# Patient Record
Sex: Female | Born: 1955 | Race: White | Hispanic: No | Marital: Married | Smoking: Never smoker
Health system: Southern US, Community
[De-identification: ages and names within clinical notes are randomized; demographics above are authoritative.]

## PROBLEM LIST (undated history)

## (undated) DIAGNOSIS — C801 Malignant (primary) neoplasm, unspecified: Secondary | ICD-10-CM

## (undated) DIAGNOSIS — R112 Nausea with vomiting, unspecified: Secondary | ICD-10-CM

## (undated) DIAGNOSIS — I10 Essential (primary) hypertension: Secondary | ICD-10-CM

## (undated) DIAGNOSIS — G5753 Tarsal tunnel syndrome, bilateral lower limbs: Secondary | ICD-10-CM

## (undated) DIAGNOSIS — Z9889 Other specified postprocedural states: Secondary | ICD-10-CM

## (undated) DIAGNOSIS — G4730 Sleep apnea, unspecified: Secondary | ICD-10-CM

## (undated) DIAGNOSIS — J45909 Unspecified asthma, uncomplicated: Secondary | ICD-10-CM

## (undated) DIAGNOSIS — R0602 Shortness of breath: Secondary | ICD-10-CM

## (undated) DIAGNOSIS — Z8719 Personal history of other diseases of the digestive system: Secondary | ICD-10-CM

## (undated) DIAGNOSIS — IMO0001 Reserved for inherently not codable concepts without codable children: Secondary | ICD-10-CM

## (undated) DIAGNOSIS — K219 Gastro-esophageal reflux disease without esophagitis: Secondary | ICD-10-CM

## (undated) DIAGNOSIS — M1990 Unspecified osteoarthritis, unspecified site: Secondary | ICD-10-CM

## (undated) DIAGNOSIS — M797 Fibromyalgia: Secondary | ICD-10-CM

## (undated) HISTORY — PX: BACK SURGERY: SHX140

## (undated) HISTORY — PX: TONSILLECTOMY: SUR1361

## (undated) HISTORY — PX: CERVICAL CONIZATION W/BX: SHX1330

## (undated) HISTORY — PX: APPENDECTOMY: SHX54

## (undated) HISTORY — DX: Tarsal tunnel syndrome, bilateral lower limbs: G57.53

---

## 2001-03-26 HISTORY — PX: CHOLECYSTECTOMY: SHX55

## 2002-03-26 HISTORY — PX: ABDOMINAL HYSTERECTOMY: SHX81

## 2003-04-02 ENCOUNTER — Encounter: Admission: RE | Admit: 2003-04-02 | Discharge: 2003-04-02 | Payer: Self-pay | Admitting: Allergy and Immunology

## 2004-03-26 HISTORY — PX: CERVICAL DISC SURGERY: SHX588

## 2005-06-21 ENCOUNTER — Inpatient Hospital Stay (HOSPITAL_COMMUNITY): Admission: RE | Admit: 2005-06-21 | Discharge: 2005-06-23 | Payer: Self-pay | Admitting: Specialist

## 2006-09-19 ENCOUNTER — Encounter: Admission: RE | Admit: 2006-09-19 | Discharge: 2006-09-19 | Payer: Self-pay | Admitting: Family Medicine

## 2006-09-24 ENCOUNTER — Inpatient Hospital Stay (HOSPITAL_COMMUNITY): Admission: RE | Admit: 2006-09-24 | Discharge: 2006-09-25 | Payer: Self-pay | Admitting: Specialist

## 2006-11-21 ENCOUNTER — Encounter: Admission: RE | Admit: 2006-11-21 | Discharge: 2007-01-08 | Payer: Self-pay | Admitting: Specialist

## 2007-02-19 ENCOUNTER — Encounter: Admission: RE | Admit: 2007-02-19 | Discharge: 2007-02-19 | Payer: Self-pay | Admitting: Specialist

## 2007-03-10 ENCOUNTER — Encounter: Admission: RE | Admit: 2007-03-10 | Discharge: 2007-03-10 | Payer: Self-pay | Admitting: Specialist

## 2009-01-05 ENCOUNTER — Ambulatory Visit (HOSPITAL_COMMUNITY): Admission: RE | Admit: 2009-01-05 | Discharge: 2009-01-05 | Payer: Self-pay | Admitting: Family Medicine

## 2009-03-01 ENCOUNTER — Ambulatory Visit (HOSPITAL_COMMUNITY): Admission: RE | Admit: 2009-03-01 | Discharge: 2009-03-01 | Payer: Self-pay | Admitting: Specialist

## 2009-07-01 ENCOUNTER — Ambulatory Visit (HOSPITAL_COMMUNITY): Admission: RE | Admit: 2009-07-01 | Discharge: 2009-07-02 | Payer: Self-pay | Admitting: Specialist

## 2010-06-08 ENCOUNTER — Other Ambulatory Visit: Payer: Self-pay | Admitting: Family Medicine

## 2010-06-08 DIAGNOSIS — M549 Dorsalgia, unspecified: Secondary | ICD-10-CM

## 2010-06-09 ENCOUNTER — Other Ambulatory Visit: Payer: Self-pay

## 2010-06-12 ENCOUNTER — Ambulatory Visit
Admission: RE | Admit: 2010-06-12 | Discharge: 2010-06-12 | Disposition: A | Discharge status: Home / Self Care | Payer: No Typology Code available for payment source | Source: Ambulatory Visit | Attending: Family Medicine | Admitting: Family Medicine

## 2010-06-12 DIAGNOSIS — M549 Dorsalgia, unspecified: Secondary | ICD-10-CM

## 2010-06-12 MED ORDER — GADOBENATE DIMEGLUMINE 529 MG/ML IV SOLN
15.0000 mL | Freq: Once | INTRAVENOUS | Status: AC | PRN
  Administered 2010-06-12: 15 mL via INTRAVENOUS

## 2010-06-14 LAB — DIFFERENTIAL
Basophils Absolute: 0.1 10*3/uL (ref 0.0–0.1)
Basophils Relative: 1 % (ref 0–1)
Eosinophils Absolute: 0.1 10*3/uL (ref 0.0–0.7)
Eosinophils Relative: 2 % (ref 0–5)
Lymphocytes Relative: 39 % (ref 12–46)
Lymphs Abs: 2.9 10*3/uL (ref 0.7–4.0)
Monocytes Absolute: 0.8 10*3/uL (ref 0.1–1.0)
Monocytes Relative: 10 % (ref 3–12)
Neutro Abs: 3.5 10*3/uL (ref 1.7–7.7)
Neutrophils Relative %: 47 % (ref 43–77)

## 2010-06-14 LAB — COMPREHENSIVE METABOLIC PANEL
ALT: 14 U/L (ref 0–35)
AST: 15 U/L (ref 0–37)
Albumin: 4.4 g/dL (ref 3.5–5.2)
Alkaline Phosphatase: 64 U/L (ref 39–117)
BUN: 15 mg/dL (ref 6–23)
CO2: 28 mEq/L (ref 19–32)
Calcium: 9.6 mg/dL (ref 8.4–10.5)
Chloride: 102 mEq/L (ref 96–112)
Creatinine, Ser: 0.96 mg/dL (ref 0.4–1.2)
GFR calc Af Amer: >60 mL/min (ref >60)
GFR calc non Af Amer: >60 mL/min (ref >60)
Glucose, Bld: 104 mg/dL — ABNORMAL HIGH (ref 70–99)
Potassium: 4 mEq/L (ref 3.5–5.1)
Sodium: 136 mEq/L (ref 135–145)
Total Bilirubin: 0.6 mg/dL (ref 0.3–1.2)
Total Protein: 6.9 g/dL (ref 6.0–8.3)

## 2010-06-14 LAB — URINALYSIS, ROUTINE W REFLEX MICROSCOPIC
Bilirubin Urine: NEGATIVE
Glucose, UA: NEGATIVE mg/dL
Hgb urine dipstick: NEGATIVE
Ketones, ur: NEGATIVE mg/dL
Nitrite: NEGATIVE
Protein, ur: NEGATIVE mg/dL
Specific Gravity, Urine: 1.011 (ref 1.005–1.030)
Urobilinogen, UA: 0.2 mg/dL (ref 0.0–1.0)
pH: 5.5 (ref 5.0–8.0)

## 2010-06-14 LAB — CBC
HCT: 40.5 % (ref 36.0–46.0)
Hemoglobin: 14.1 g/dL (ref 12.0–15.0)
MCHC: 34.9 g/dL (ref 30.0–36.0)
MCV: 88.5 fL (ref 78.0–100.0)
Platelets: 250 10*3/uL (ref 150–400)
RBC: 4.57 MIL/uL (ref 3.87–5.11)
RDW: 11.9 % (ref 11.5–15.5)
WBC: 7.5 10*3/uL (ref 4.0–10.5)

## 2010-06-14 LAB — PROTIME-INR
INR: 0.96 (ref 0.00–1.49)
Prothrombin Time: 12.7 seconds (ref 11.6–15.2)

## 2010-07-14 ENCOUNTER — Encounter (HOSPITAL_COMMUNITY)
Admission: RE | Admit: 2010-07-14 | Discharge: 2010-07-14 | Disposition: A | Discharge status: Home / Self Care | Payer: Medicare Other | Source: Ambulatory Visit | Attending: Specialist | Admitting: Specialist

## 2010-07-14 ENCOUNTER — Other Ambulatory Visit (HOSPITAL_COMMUNITY): Payer: Self-pay | Admitting: Specialist

## 2010-07-14 ENCOUNTER — Ambulatory Visit (HOSPITAL_COMMUNITY)
Admission: RE | Admit: 2010-07-14 | Discharge: 2010-07-14 | Disposition: A | Discharge status: Home / Self Care | Payer: Medicare Other | Source: Ambulatory Visit | Attending: Specialist | Admitting: Specialist

## 2010-07-14 DIAGNOSIS — Z01818 Encounter for other preprocedural examination: Secondary | ICD-10-CM | POA: Insufficient documentation

## 2010-07-14 DIAGNOSIS — Z01812 Encounter for preprocedural laboratory examination: Secondary | ICD-10-CM | POA: Insufficient documentation

## 2010-07-14 DIAGNOSIS — Z01810 Encounter for preprocedural cardiovascular examination: Secondary | ICD-10-CM | POA: Insufficient documentation

## 2010-07-14 DIAGNOSIS — M48061 Spinal stenosis, lumbar region without neurogenic claudication: Secondary | ICD-10-CM | POA: Insufficient documentation

## 2010-07-14 DIAGNOSIS — J45909 Unspecified asthma, uncomplicated: Secondary | ICD-10-CM | POA: Insufficient documentation

## 2010-07-14 LAB — CBC
HCT: 41.5 % (ref 36.0–46.0)
Hemoglobin: 14.6 g/dL (ref 12.0–15.0)
MCH: 30.6 pg (ref 26.0–34.0)
MCHC: 35.2 g/dL (ref 30.0–36.0)
MCV: 87 fL (ref 78.0–100.0)
Platelets: 293 10*3/uL (ref 150–400)
RBC: 4.77 MIL/uL (ref 3.87–5.11)
RDW: 12 % (ref 11.5–15.5)
WBC: 8.9 10*3/uL (ref 4.0–10.5)

## 2010-07-14 LAB — PROTIME-INR
INR: 0.85 (ref 0.00–1.49)
Prothrombin Time: 11.8 seconds (ref 11.6–15.2)

## 2010-07-14 LAB — URINALYSIS, ROUTINE W REFLEX MICROSCOPIC
Bilirubin Urine: NEGATIVE
Glucose, UA: NEGATIVE mg/dL
Hgb urine dipstick: NEGATIVE
Ketones, ur: NEGATIVE mg/dL
Nitrite: NEGATIVE
Protein, ur: NEGATIVE mg/dL
Specific Gravity, Urine: 1.008 (ref 1.005–1.030)
Urobilinogen, UA: 0.2 mg/dL (ref 0.0–1.0)
pH: 6 (ref 5.0–8.0)

## 2010-07-14 LAB — DIFFERENTIAL
Basophils Absolute: 0.1 10*3/uL (ref 0.0–0.1)
Basophils Relative: 1 % (ref 0–1)
Eosinophils Absolute: 0.2 10*3/uL (ref 0.0–0.7)
Eosinophils Relative: 2 % (ref 0–5)
Lymphocytes Relative: 30 % (ref 12–46)
Lymphs Abs: 2.6 10*3/uL (ref 0.7–4.0)
Monocytes Absolute: 0.8 10*3/uL (ref 0.1–1.0)
Monocytes Relative: 9 % (ref 3–12)
Neutro Abs: 5.3 10*3/uL (ref 1.7–7.7)
Neutrophils Relative %: 59 % (ref 43–77)

## 2010-07-14 LAB — SURGICAL PCR SCREEN
MRSA, PCR: NEGATIVE
Staphylococcus aureus: NEGATIVE

## 2010-07-14 LAB — COMPREHENSIVE METABOLIC PANEL
Albumin: 4.4 g/dL (ref 3.5–5.2)
BUN: 10 mg/dL (ref 6–23)
Calcium: 9.6 mg/dL (ref 8.4–10.5)
Creatinine, Ser: 0.98 mg/dL (ref 0.4–1.2)
Glucose, Bld: 105 mg/dL — ABNORMAL HIGH (ref 70–99)
Total Protein: 7.3 g/dL (ref 6.0–8.3)

## 2010-07-14 LAB — TYPE AND SCREEN
ABO/RH(D): B POS
Antibody Screen: NEGATIVE

## 2010-07-17 ENCOUNTER — Inpatient Hospital Stay (HOSPITAL_COMMUNITY): Payer: Medicare Other

## 2010-07-17 ENCOUNTER — Inpatient Hospital Stay (HOSPITAL_COMMUNITY)
Admission: RE | Admit: 2010-07-17 | Discharge: 2010-07-20 | DRG: 460 | Disposition: A | Discharge status: Home / Self Care | Payer: Medicare Other | Source: Ambulatory Visit | Attending: Specialist | Admitting: Specialist

## 2010-07-17 DIAGNOSIS — J45909 Unspecified asthma, uncomplicated: Secondary | ICD-10-CM | POA: Diagnosis present

## 2010-07-17 DIAGNOSIS — K219 Gastro-esophageal reflux disease without esophagitis: Secondary | ICD-10-CM | POA: Diagnosis present

## 2010-07-17 DIAGNOSIS — M5137 Other intervertebral disc degeneration, lumbosacral region: Secondary | ICD-10-CM | POA: Diagnosis present

## 2010-07-17 DIAGNOSIS — M48061 Spinal stenosis, lumbar region without neurogenic claudication: Principal | ICD-10-CM | POA: Diagnosis present

## 2010-07-17 DIAGNOSIS — M51379 Other intervertebral disc degeneration, lumbosacral region without mention of lumbar back pain or lower extremity pain: Secondary | ICD-10-CM | POA: Diagnosis present

## 2010-07-17 DIAGNOSIS — D62 Acute posthemorrhagic anemia: Secondary | ICD-10-CM | POA: Diagnosis not present

## 2010-07-17 DIAGNOSIS — J309 Allergic rhinitis, unspecified: Secondary | ICD-10-CM | POA: Diagnosis present

## 2010-07-18 LAB — BASIC METABOLIC PANEL
BUN: 3 mg/dL — ABNORMAL LOW (ref 6–23)
CO2: 26 mEq/L (ref 19–32)
Calcium: 7.5 mg/dL — ABNORMAL LOW (ref 8.4–10.5)
Glucose, Bld: 154 mg/dL — ABNORMAL HIGH (ref 70–99)
Sodium: 138 mEq/L (ref 135–145)

## 2010-07-18 LAB — HEMOGLOBIN AND HEMATOCRIT, BLOOD: Hemoglobin: 9.4 g/dL — ABNORMAL LOW (ref 12.0–15.0)

## 2010-07-19 ENCOUNTER — Inpatient Hospital Stay (HOSPITAL_COMMUNITY): Payer: Medicare Other

## 2010-07-20 LAB — HEMOGLOBIN AND HEMATOCRIT, BLOOD: Hemoglobin: 9 g/dL — ABNORMAL LOW (ref 12.0–15.0)

## 2010-08-08 NOTE — Op Note (Signed)
[name], [name]              ACCOUNT NO.:  [id number]   MEDICAL RECORD NO.:  [id number]          PATIENT TYPE:  INP   LOCATION:  2550                         FACILITY:  [facility]   PHYSICIAN:  Kerrin Champagne, M.D.   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   PROCEDURE:  Right L5-S1 microdiskectomy, left L5 foraminotomy with left  sided hemilaminectomy and left lateral recess decompression L4-5 and L5-  S1.   SURGEON:  Kerrin Champagne, M.D.   ASSISTANT:  Wende Neighbors, P.A.   ANESTHESIA:  General via oral tracheal intubation, Dr. Noreene Larsson.   SPECIMENS:  None.   ESTIMATED BLOOD LOSS:  50 mL or less.   COMPLICATIONS:  None.   DRAINS:  Foley to straight drain.   The patient returned to the PACU in good condition.   HISTORY OF PRESENT ILLNESS:  The patient is a 55 year old female who has  undergone previous anterior cervical diskectomy and fusion for disk  herniation in 2007.  She has been experiencing discomfort and pain for  the last 3 months, worsening in the right leg greater than left with  right sided leg numbness.  She has had previous lumbar spine surgery 12  years ago at [facility]  She is seen in the office complaining of  increasing neurogenic claudication symptoms, pain and radiation into  both lower extremities on right side S1 distribution and left side L5  distribution.  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  Lateral recess stenosis on left side L4-5 and L5-  S1.  The patient's clinical exam is consistent with a left L5  radiculopathy, right S1  radiculopathy.   INTRAOPERATIVE FINDINGS:  The patient was found to have scar along the  left side at the L5-S1 level.  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  She has severe lateral recess entrapment at the left S1  nerve root and the right S1 nerve root within the lateral recess  secondary to degenerative disk bulging over the posterior aspect of the  disk space, comminution with facet hypertrophy affecting the lateral  recess in the left neural foramen from the L5 nerve root.  The reflected  portions of ligamentum flavum, the very superior aspect of the left S1  superior articular process was resected in order to decompress the left  L5 nerve root.  Recess at L4-5 was also narrowed causing 5 nerve root  compression.  Disk protrusion right side L5-S1.   DESCRIPTION OF PROCEDURE:  After adequate general anesthesia with the  patient in a knee-chest position, Andrews frame, standard preoperative  antibiotics, standard preoperative antibiotics, standard prep  with  DuraPrep solution and all pressure points well padded, a Foley catheter  placed prior to turning.  The patient underwent a prep with DuraPrep  solution.  Midline extended from the mid dorsal segment to the mid  sacral level, draped in the usual manner and an iodine body drape was  used.  The patient had a spinal needle placed over the posterior aspect  of the lumbosacral region at the expected lower lumbar upper sacral  segment, the needle was found to be between the S1 and S2 spinous  processes on lateral radiograph.  An incision was made ellipsing an old  incision scar in the midline.  This was carried superiorly an additional  1-1/2 inches.  Through the skin and subcutaneous layers down to the  lumbodorsal fascia, this was incised on the left side of the expected  spinous process at L4, L5 and S1 and on the right side at the L5 and S1   levels.  A Kocher clamp placed over the spinous process of L5 and  intraoperative lateral radiograph demonstrated the clamp at the L5  spinous process.  This was marked for continued identification  throughout the case.  Cobbs used to elevate paralumbar muscles both  sides over the L5 lamina over the posterior aspect of the interlaminar  region, care taken not to enter the interlaminar region left or right  side and then carried up to the L4-5 level on the left side.  McCullough  retractor boss type with a large blade on the left side and smaller  blade on the right side was then placed.  Using loop magnification and a  head lamp, then a small amount of the inferior aspect of the lamina of  L5 and at L4 was resected on the left side.  A 3-mm Kerrison was used to  then remove further bone along the left inferior lamina of L5 extending  superior to the L4-5 level and completing a hemilaminectomy on the left  side at the left L5 lamina.  A small amount of the superior lamina of S1  was resected on the left side.  Foraminotomy performed over the left S1  nerve root.  The lateral recess then decompressed on the left side,  continuing along the medial aspect of the S1 pedicle and then removing  superior articular process and inferior articular process bone medially,  completing the decompression, and then from superiorly, the lateral  recess of L4 was resected and decompressed using a 3-mm Kerrison loop  magnification and a head light, removing hypertrophic ligamentum flavum  reflected portions medially decompressing the L5 nerve root at the L4-5  level.  This was carried out the L5 neuroforamen using a high speed bur  to carefully thin the area over the medial aspect of the pars,  preserving at least 8 mm of pars bone from medial to lateral.  This  allowed [name] to expose the L5 nerve root within the neuroforamen and resect bone over the superiormost aspect of the S1 superior articular  process on  the left side, removing hypertrophic ligamentum flavum within  the neuroforamen, decompressing the L5 nerve root.  On the right side, a  small amount of bone was removed from the inferior lamina of L5 on the  right side.  A 3-mm Kerrison used to carefully free up the insertion at  the ligamentum flavum to the ventral surface of the L5 inferior lamina.  Then, placing the Kerrisons over the superior aspect of the S1  lamina,  the ligamentum flavum was further resected and freed up and able to be  removed off the medial facet at L5-S1.  Foraminotomy performed over the  S1 nerve root.  Lateral recess decompressed on the right side using 3-mm  Kerrisons until the hypertrophic ligamentum flavum had been resected and  the S1 pedicle as identified.  The S1 nerve root identified.   The operating room microscope was then carefully draped, brought into  the field.  Under the operating room microscope, then a right S1 nerve  root was mobilized freeing up epidural veins, cauterizing where  appropriate using bipolar electrocautery, then moving across and  retracting the S1 nerve root and lateral aspect of thecal sac on the  right side.  Disk was identified at L5-S1, found to be herniated.  The  15 blade scalpel used to incise the disk longitudinally and then a nerve  hook used to free up loose disk material in a subligamentous fashion.  Epstein curet used also to debride and loosen more fibrotic and  calcified herniated disk material posteriorly.  This was then resected  using pituitary rongeurs.  The disk space itself could not be entered  due to the severe nature of the narrowing of the disk space, however,  the disk and subligamentous herniated material was removed.  S1 nerve  root was then freely able to exit out its neuroforamen without any nerve  compression.  Hockey stick neural probe could be passed out the L5  neuroforamen on the right side without difficulty.  Attention then  turned, using the  microscope to the left side at L5 nerve root where  this nerve was found to be compressed within the neuroforamen.  A high  speed bur used to remove additional bone over the superior aspect of the  left L5-S1 facet in the pars area, again preserving up to 8 mm of pars.  Then using Kerrisons to undercut the lamina area and resect the  superiormost reflected portions of ligamentum flavum that were pressing  upon the L5 nerve root within the neuroforamen.  This was carried out  without difficulty.  The L5 nerve root noted to be exiting without  difficulty.  A hockey stick neural probe could be easily passed out the  left L5 neuroforamen above and below the nerve root.  Carefully  examining the L4-5 lateral recess, the L5 nerve root appeared to be  traveling within the lateral recess without any further nerve  compression.  This completed the procedure for decompression in both the left L4-5 and L5-S1 level and left L5 neuroforamen as well as right L5-  S1 microdiskectomy.  Irrigation was performed, careful hemostasis, bone  wax applied to the bleeding cancellous bone surfaces, excess bone wax  removed.  Thrombin soaked Gelfoam first placed and then removed and no  Gelfoam was left within the lateral recesses.  There was no active  bleeding present.  Soft tissues were allowed to fall back into place.  Lumbodorsal fascia reapproximated with interrupted #1 Vicryl sutures.  Deep subcutaneous layers approximated with interrupted 0 Vicryl sutures,  more superficial layers with interrupted 2-0 Vicryl sutures and the skin  closed with a running subcutaneous stitch of 4-0 Vicryl.  Dermabond was  then applied, then 4 x 4's fixed to the skin with Hypafix tape.  Note  that the skin and subcutaneous layers were reinfiltrated with Marcaine  0.5% 1:200,000 epinephrine at the end of the case.  The patient was then  reactivated, extubated, and  returned to the recovery room in  satisfactory condition.  All  instrument and sponge counts were correct.      Kerrin Champagne, M.D.  Electronically Signed     JEN/MEDQ  D:  09/24/2006  T:  09/24/2006  Job:  [id number]

## 2010-08-09 NOTE — Op Note (Signed)
NAMEMAKENZI, BANNISTER              ACCOUNT NO.:  [id number]  MEDICAL RECORD NO.:  [id number]           PATIENT TYPE:  I  LOCATION:  5025                         FACILITY:  [facility]  PHYSICIAN:  Kerrin Champagne, M.D.   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  A combination of hypertrophic ligamentum flavum and spur off the medial aspect of the left L2-3 facet. Left L3-4 lateral recess stenosis with stenosis of the foramen secondary to hypertrophic facet changes as well as underlying disk bulge into the lateral aspect of the foramen.  L4-5 bilateral lateral recess stenosis secondary to hypertrophic changes of facets and disk bulge.  Left disk injury with disk tear into the neural foramen.  L5-S1 degenerative disk disease, severe left L5 foraminal entrapment status post previous decompression with recurring stenosis out lateral.  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  A combination of hypertrophic ligamentum flavum and spur off the medial aspect of the left L2-3 facet. Left L3-4 lateral recess stenosis with stenosis of the foramen secondary to hypertrophic facet changes as well as underlying disk bulge into the lateral aspect of the foramen.  L4-5 bilateral lateral recess stenosis secondary to hypertrophic changes of facets and disk bulge.  Left disk injury with disk tear into the neural foramen.  L5-S1 degenerative disk disease, severe left L5 foraminal entrapment status post previous decompression with recurring stenosis out lateral.  Also, during the surgery she had a right S1 nerve root bleb associated with minor to moderate tear, requiring single suture of 4-0 Nurolon repair.  DuraSeal was used.  PROCEDURE:  Left L5-S1  transforaminal lumbar interbody fusion with an 11- mm DePuy lordotic Concorde cage and local bone graft.  Central laminectomy at L4-5, L5-S1 and L3-4.  With left-sided L5-S1 total facetectomy for decompression of the L5 nerve root.  Foraminotomy left L4-5 with central lam at this level, left L2-3 lateral recess decompression, posterior lateral fusion then carried out at the L4-5 and L5-S1 levels of the posterior and lateral fusion with local bone graft. Posterior segmental instrumentation using pedicle screws and rods from L4-S1, a total of 3 vertebral segments.  A 7-mm screws were used at each levels.  Dural repair right S1 dorsally at nerve root shoulder using a single 4-0 Nurolon suture.  SURGEON:  Kerrin Champagne, MD  ASSISTANT:  Maud Deed.  ANESTHESIA:  General via orotracheal intubation, Dr. Achille Rich. Anesthesia record.  Supplemented with local infiltration with Marcaine 0.5% with 1:200,000 epinephrine 20 mL midline.  ESTIMATED BLOOD LOSS:  750 mL.  CELL SAVER BLOOD RETURN:  250 mL.  DRAINS:  Foley to straight drain, Hemovac x1, left lower lumbar.  COMPLICATIONS:  Left S1 nerve root dural bleb 1 mm x 2 mm in length.  CLINICAL HISTORY:  This patient is a 55 year old female.  She has had a history of previous laminectomy surgery bilateral at L5-S1 and left side L4-5  for problems of primary foraminal stenosis and lateral recess stenosis.  She has severe degenerative disk disease L5-S1.  She unfortunately has persisted with pain discomfort for years, requiring use of medications, anti-inflammatory agents, intermittent narcotics and muscle relaxers.  Over the last year, her pain has become increasingly worsened with difficulty standing or sitting for any length of time. 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  She has undergone attempts at conservative management that were unsuccessful.  Therapy was with  no benefit.  Further workup has demonstrated severe recurrent foraminal stenosis affecting the left L5 nerve root, bilateral lateral recess stenosis L4-5 with disk injury at L4-5 level, lateral recess stenosis left side L3-4 and at the L2-3 levels.  After discussion, the patient has decided to undergo intervention attempts to relieve her discomfort to improve her standing, walking tolerance primarily.  The risks of surgery including risks of infection, bleeding, neurologic compromise discussed with this patient and planned surgery as well.  Aforementioned procedure discussed.  She has signed informed consent.  DESCRIPTION OF PROCEDURE:  This patient was seen in the preoperative holding area, had marking of the left side back at the left L4-5, L5-S1 level, her left L4-5 TLIF and left L5-S1 facetectomy, central laminectomies planned at L3-L4, L4-L5, L5-S1, extension then to the L2-3 level for lateral recess decompression on the left side.  All questions were answered in the preop holding area.  She has signed informed consent.  Standard preoperative antibiotics were delivered.  The patient transferred via stretcher to OR room #4.  There, she underwent induction of general anesthesia, Foley catheter was placed.  She was then turned to a prone position and Jackson spine frame was used for the case.  All pressure points were well-padded, arms at side 90-90, elbows padded. PAS hose for both lower extremities to prevent DVT.  The patient had a standard prep with DuraPrep solution from mid-dorsal segment to the mid sacral level.  Draped in the usual manner, iodine Vi-Drape was used. Standard time-out protocol carried out identifying the patient, procedures to be done, estimated length of time, blood loss.  Midline previous incision was infiltrated with Marcaine 0.5% with 1:200,000 epinephrine and an ellipse incision eclipsing the old incision scar and this was then carried down to the spinous  processes that were present L2- L3, L4-L5, S1-S2.  Incision was carried sharply with the Bovie electrocautery along the lateral aspects of spinous process of L2-L3, L4- L5, S1-S2 both sides.  Cobbs were used to elevate the paralumbar muscles bilaterally and cerebellar retractors were inserted.  Facets preserved at the L2-3, L3-4 levels.  Resected it out the expected L4-L5 levels after first identifying these levels.  Clamps were placed on either side at expected spinous process of L5.  Intraoperative lateral radiograph demonstrated the upper clamp at the L4-5 level, lower clamp just on the spinous process of L5.  L5 spinous process was then marked using Baer rongeur and also marked at the interval between L4 and L5 spinous process.  C-arm fluoro was draped sterilely and brought on the field for this purpose.  With this then Leksell rongeur was used to remove spinous process of L5, small portion of the upper aspect of the spinous process of S1 and then the entire spinous process of L4 and L3, inferior third of the spinous process of L2.  Central portions of lamina of L3, L4 and L5 were then carefully thinned using Leksell rongeurs centrally. Osteotomes were then  used to resect inferior reticular process of L5 on the left side as well as the inferior articular process of L4 on the left side for expected TLIF of the L4 level and for performing foraminotomy in left L5 nerve root.  The central portions of lamina at L5, L4 and L3 were then carefully resected using osteotomes were best possible to remove bone and large portions and then Kerrisons 3 and 4 mm Kerrisons performing a central laminectomy from L5-L4-L3.  This was then lightened using osteotomes bilaterally, resecting the inferior pars on the left at the L5 level, the inferior reticular process of L5.  The medial aspect of the L4 inferior articular process was resected over 50% to allow for decompression of the left L4-5 lateral recess.   At the 3-4 level, bilateral lateral recess decompression was carried out using first osteotome resect about 10-15% of the medial aspect of facet at 3-4 and then on the left side at L2-3.  Loupe magnification headlamp was used during this portion of procedure as well to regrowth and protect thecal sac, cottonoids were appropriate.  Hypertrophic ligamentum flavum was resected off the medial aspect of the facet at 2-3, 3-4 level bilaterally decompressing the L2-L3 nerve roots bilaterally. Foraminotomy performed on left L3-4 level.  First starting on the left side, right side was decompressed with resection of hypertrophic flavum and decompression of the neural foramen for the 3 root, 4 root, 5 root on the right side and then S1.  The superior portion of the lamina S was carefully resected centrally and then to the right hand to the left. Foraminotomy performed over both S1 nerve roots.  An osteotome was then used to resect the medial aspect of the right L5-S1 facet and nearly 50% was resected in order to get out to lateral recess decompress S1 nerve root, while resecting the bone after osteotomizing, a bleb was noted over the shoulder of the S1 nerve root.  It was carefully covered with cottonoid.  No leak noted.  Continued dissection on the right side for decompression of the L5-L4 and L3 nerve roots by resecting reflected portions of flavum of the medial and ventral surfaces of the facet joints at each level.  Then changing sides to the right-hand side of this patient, the left-hand side was then decompressed further from 2-3 down to 3-4 foraminotomy performed of the left L3 nerve root decompressing the 3 root patella was carefully traced laterally exiting, placed a Hockey-stick both over and underneath the nerve root demonstrating patency of the foramen out far lateral out beyond the facet.  Lateral recess at 3-4 decompressed and at 4-5 osteotomy carried out of the inferior reticular  process of L4 and the pars region resected, so that the reflected portion of ligamentum flavum both medial as well as ventral was completely resected on the 4 nerve roots traced laterally.  The superior reticular process of L5 was resected even with the upper aspect of the pedicle 5 at this level, so the nerve root was easily identified coursing over the inferior aspect of the pedicle afford exiting out the neural foramen.  The fibrovascular tissue within the axillary area of the 4 root identified, cauterized and the disk easily visualized.  Continuation of disk dissection then distally, facet overlying the L5 nerve root was resected using Leksell rongeurs to thin osteotomes were necessary.  All this bone graft was carefully morselized, debrided of any soft tissue attachment for later bone grafting purposes.  Five root was completely decompressed  out laterally and following resection of the facet, the patient still noted to have a layer of hypertrophic flavum pressing from dorsal to ventral and pressing upon the 5 root as it exited and this was carefully lifted off the nerve with a hockey-stick neural probe and then resected with 3- mm Kerrisons.  The patient's S1 nerve root and left neural foramen was then carefully foraminotomized using a 3-mm Kerrison, medial border of the S1 pedicle identified.  Hockey-stick neural probe could then be passed out bilateral at S1-L5, L4-L3 neural foramen without difficulty. This completed decompression portion of procedure.  C-arm fluoro brought into the field and all placed against the right-sided L4 pedicle at the intersection of the lateral aspect of the pedicle with transverse process, identified on C-arm to be in excellent position alignment. Handheld pedicle probe was then used to probe the pedicle L4 in the right side to the depth of about 35 mm, observed on C-arm fluoro to be at halfway or greater on the right side, so that a 35-mm length  screw was chosen x7 mm.  The patient's pedicle was felt to be quite wide. Tapping performed using a 6.0 mm tap decortication of the transverse process and lateral aspect of superior articular process before bone grafting carried out.  Ball-tip probe used to carefully probe the nerve opening into the pedicle to ensure no broaching cortex.  The 35 mm  x 7 mm screw was then placed without difficulty.  Decortication was carried out over the transverse process of 5 as well.  All brought to the 5 levels so that on checking the screw placement at the 4 level, the awl placement of 5 was noted to be in good position alignment, so the awl was removed and a handheld pedicle probe was then used probe pedicle 5 correct degree of convergence lordosis to a depth of nearly 40 mm.  This was the accepted length, 7 mm, was chosen here as well.  Tapping was 6.0 tap.  Ball-tip probe was then used carefully probe the opening of the pedicle ensure no broaching cortex and a 40 mm x 7 mm screw placed on the right side at the L5 level.  Bone graft was applied to the intertransverse area on the 4-5 level on the right and to the posterior aspect of the transverse process of 5.  The awl was then introduced at the lower half of the superior reticular process of S1 the area of dimple here.  An intraoperative radiograph demonstrated this to be in the correct position alignment for placement of sacral pedicle screw on the right side.  Facet at S1 level.  A hockey-stick neural probe used to probe the inferior extent of the pedicle as well as superior extent medial noted.  Using a pedicle probe then the S1 pedicle was probed to the depth of 35 mm.  A 35 mm x 7 mm screw was chosen.  Tapping a 6.0 tap just the superficial cortex.  Decortication of the sacral ala carried out bone graft with local bone graft then from the sacral ala to the right L5 transverse process.  This 35-mm screw then placed after first checking the  opening of the pedicle using a pedicle probe ball-tipped and this ensured no broaching cortex, screw was then placed without difficulty, observed on the C-arm fluoro to be in good position alignment.  Continuing on to the left side and after first placing a 55- mm rod into the fasteners from L4-S1 and placing the  initial caps. Change the size in the left side and then the patient has similarly loose screws placed at L4-L5 and S1, again decortication of the transverse process at each level.  An awl used under C-arm fluoro to ascertain the correct position alignment lordosis and convergence and a handheld pedicle probe was used to probe each of the pedicles probing with a ball-tipped probe each to ensure patency and no sign of broaching cortex.  Tapping was 6 mm tap and then placing screws appropriately on the left side at the L4 level again 40 mm x 7 mm screw was used at the 5 level, again a 7 mm x 40 mm screw and on the left side at the sacral level 40 mm screw was used here.  This provided excellent fixation at each level.  Excellent screw bone purchase.  Note, the bone graft was applied along the posterior and lateral region extending along the intertransverse area from L4 to L5-S1 and decorticated areas.  Attention then turned to the TLIF on the left side at the 4-5 level where a D'Errico retractor was then used to retract the thecal sac inferior to the L4 nerve root.  An opening made into the disk using a 15-blade scalpel at level of the pedicle, disk removed using pituitary rongeurs. Dilation of the disk space 8, 9, 10 and 11-mm dilator placed.  The space was then able to be debrided with straight upbiting pituitaries of larger variety.  Curettage carried out of the endplates using the straight upbiting right, upbiting left curettes as well as straight ring and upbiting ring curettes until we were down to bleeding endplate surfaces.  Care was taken not to damage the endplates.   Following this, then trial was installed into the disk space 11 mm trial found to provide best fit.  The patient had packing of the intervertebral disk space local bone graft.  Bone grafts placed into the intervertebral disk space and then packed into place using 8 mm sounder.  Following this, then the cage was installed, impacted into place and convergence about 15-20 degrees further impacted and rotated slightly.  Subset beneath the posterior aspect of the space by 3 mm.  Care taken to ensure there was no bone graft within this spinal canal.  Hockey-stick neural probe passed out the neural foramen along the left side S1-L5, L4-L3.  The bleb noted over the S1 nerve root on the right side was carefully repair with a single 4-0 Nurolon suture using loupe magnification and a figure- of-eight suture passed and carefully knotted without difficulty.  At this point, then the fasteners, the caps were then tightened on the right side to 80 foot pounds in the L4 level.  Left side length, the length of rod was measured at 65 mm and a precontoured rod was placed here.  The fastener on the left side at L4 tightened at 80 foot-pounds. Compression between the left L4 and L5 fasteners and the fastener cap at the 5 level was tightened to 80 foot-pounds.  The fastener at the S1 level on the left side was then tightened to 80 foot-pounds maintaining neutral compression distraction to the 5-1 level to prevent any further compression of the 5 root and to prevent distraction or compression here.  The right side then had compression between the 4 and 5 fasteners and the fastener at the 5 levels tightened to 80 foot pounds and then the fastener at the cap at the S1 levels was tightened at 80-foot pounds.  A cross-linking rod was then placed between the L4 and L5 fasteners to the rod tightened to 80 foot-pounds at each of the hooks on the sides and the central nut which adjusted length was tightened and an A6  cross link was used by [employer].  Following this, then irrigation was carried out.  Careful inspection of nerve root scan demonstrating well- decompressed throughout.  The area of bleb over the right side was then coated with DuraSeal.  Once this was solidified, then medium Hemovac drain was placed in the incision exiting on the left lower lumbar spine. Paralumbar muscles were reapproximated in midline with interrupted #1 Vicryl sutures.  Her lumbodorsal fascia was reattached to the spinous process, interspinous ligaments with interrupted #1 Vicryl sutures superiorly as well as inferiorly.  Then, the midline attached itself with interrupted figure-of-eight simple sutures #1 Vicryl.  Deep subcu layers was approximated with interrupted 0 Vicryl sutures and more superficial layers with interrupted 2-0 Vicryl suture.  Skin closed in a running subcu stitch of 4-0 Vicryl.  Note, at the end of the case, all instrument, sponge counts were correct.  A single cottonoid found under my foot at the end of the case which represented the final cottonoid that represented clean count at the end of case.  Following the closure of the incision, then with running subcu stitch of 4-0 Vicryl, Dermabond was applied and Mepilex bandage, then taped in place with pink tape. The patient was then returned to a supine position, reactivated, extubated, returned to recovery room in satisfactory condition.  All instrument, sponge counts were correct.  The patient had Cell Saver blood returned to 250 mL.  Physician assistant's responsibilities, Maud Deed, performed duties of Designer, television/film set.  She performed careful retraction of neural structures, careful suctioning using a Cell Saver throughout the case using loupe magnification.  She assisted in careful delicate retraction during insertion of cages in TLIF portion of procedure and also assisted in the tightening of the rods and screws, the instrumentation from L4 to  sacrum.  She was present from beginning of case to the end of the case, present for the initial positioning of the patient and at the end of the case for removal from the operating table.  Note, complication, bleb right S1 nerve root at shoulder, single suture repair.     Kerrin Champagne, M.D.     JEN/MEDQ  D:  07/18/2010  T:  07/19/2010  Job:  [id number]  Electronically Signed by Vira Browns M.D. on 08/09/2010 06:02:00 PM

## 2010-08-11 NOTE — Op Note (Signed)
[name], [name]              ACCOUNT NO.:  [id number]   MEDICAL RECORD NO.:  [id number]          PATIENT TYPE:  INP   LOCATION:  5029                         FACILITY:  [facility]   PHYSICIAN:  Kerrin Champagne, M.D.   DATE OF BIRTH:  [birth year]   DATE OF PROCEDURE:  06/21/2005  DATE OF DISCHARGE:  06/23/2005                                 OPERATIVE REPORT   PREOPERATIVE DIAGNOSIS:  Cervical stenosis, C5-6 and C6-7.   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   PROCEDURE:  Anterior cervical diskectomy and fusion, C5-6, C6-7, with right  iliac crest bone graft harvested through a separate incision and anterior  cervical plate fixation using an Alphatec 40 mm six-hole plate and 14 mm  screws.   SURGEON:  Kerrin Champagne, M.D.   ASSISTANT:  Wende Neighbors, P.A.   ANESTHESIA:  GOT, Bedelia Person, M.D., and Kaylyn Layer. Michelle Piper, M.D.   ESTIMATED BLOOD LOSS:  100 mL.   DRAINS:  A TLS drain, anterior neck; Foley to straight drain.   The patient returned to the PACU in good condition.   HISTORY OF PRESENT ILLNESS:  This patient is a 55 year old female with  severe neck pain, shoulder pain and radiation to both arms.  She relates her  pain has been worsening over the last several years.  She has been diagnosed  as having chronic fibromyalgia.  Underwent preoperative studies which  included MRI study demonstrating a significant cervical stenosis at both C5-  6 and C6-7.  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  She was brought  to the operating room because of persistent severe neck pain, shoulder pain,  felt t be secondary to cervical stenosis and severe degenerative disk  changes at the C5-6 and C6-7 levels.   INTRAOPERATIVE FINDINGS:  Severely degenerated disk  at both segments with  anterior osteophytosis.  Central canal stenosis present with ossification of  the posterior longitudinal ligament, biforaminal stenosis at both C5-6 and  C6-7 noted.   DESCRIPTION OF PROCEDURE:  After adequate general anesthesia, the patient in  a beach chair position, the neck in slight extension with five pounds  cervical halter traction.  A bump under the right iliac crest to elevate the  crest on the right side.  All pressure points well-padded and skids were  used for the shoulders and arms, well padding the elbows and all pressure  areas.  TED hose bilaterally, a Foley catheter placed following induction of  anesthesia.  Standard preoperative antibiotics, standard prep over the  anterior neck and right iliac crest, DuraPrep solution, draped in the usual  manner, iodine Vi-Drape was used.   Incision over the left anterior neck in line with the expected C6 level,  through the skin and subcutaneous layers using a 10 blade scalpel after  infiltration with Marcaine 0.5% and 1:200,000, and this incision carried  through the platysmal layer in line with the skin  incision and then the soft  tissue spread, developing the plane between the trachea and esophagus  medially and the carotid sheath laterally to the anterior aspect of the  cervical spine.  The inferior thyroid veins and arteries were quite large  but were preserved, as was the superficial vein just beneath the platysmal  layer laterally.   Carefully incision made in the platysma to extend the incision both  superiorly and inferiorly to gain further exposure in the anterior aspect of  the cervical spine.  Prevertebral fascia incised over the medial border of  the longus colli muscle and then carefully teased across the midline using  the [employer] as well as Art therapist.  The longus colli muscle  bilaterally developed using both Key elevators as well as electrocautery,  exposing the lateral aspects  of the anterior aspect of the cervical spine at  the expected C6-7 and C5-6 level.  Spinal needles with sheath intact, only  allowing a centimeter to protrude, were then inserted at the C5-6 and C6-7  level and intraoperative lateral radiograph obtained demonstrating these  needles to be in the said place.  While the radiograph was being developed,  right iliac crest bone graft harvest site was exposed through an incision  approximately 3.5-4 cm in length through the skin and subcutaneous layers  after infiltration with Marcaine 0.5% with 1:200,000 epinephrine.  The  incision carried sharply down to the superolateral aspect of the iliac  crest, incised between the fascial layers of the proximal thigh and the  abdominal fascial layer down to the periosteum.  This was then incised and  subperiosteal dissection then used to expose the superior aspect of the  iliac crest about three inches posterior to the anterior superior iliac  spine.  Cobb used to elevate the muscle subperiosteal off of the iliac  crest, exposing the medial and lateral aspects for protection of soft tissue  here.  This was then packed and after radiographs had demonstrated the  aforementioned levels, the previous neck incision was re-exposed.  Hand-held  Clowards used to expose this area.   A Boss-McCullough retractor was inserted with the foot of the blades beneath  the medial border of the longus colli muscle bilaterally at the C6-7 level.  A Leksell rongeur used to excise the anterior lip osteophytes at the C6-7  level and the disk was then further incised using 3 mm Kerrison.  Anterior  lip osteophytes completely resected.  A 14 mm screw post then placed into  the anterior aspect of the vertebral body of C7 and of C6 and distraction  maintained across the disk space.  A high-speed bur was then used to  carefully remove the end plates, cartilage end plates of bone material, opening the disk space to nearly 9 mm.  This  was carried back to the  posterior aspect of the vertebral body of C6 and of C7 superior aspect of  the vertebral body, resecting about 2-3 mm on either side of the disk space  all the way back.  A curette was then used to carefully remove end plate  osteophytes, posterior lip osteophytes, extending to the left side within  the neural foramen.  A 1 mm Kerrison was then able to be introduced beneath  the posterior lip osteophytes, resecting transversely both proximal to and  distal to the osteophyte that was traversing each segment.  Bone material  was found to be present centrally within the central portions of the  posterior longitudinal  ligament, and one fragment in particular was quite  free requiring it to be lifted using a pituitary rongeur and then carefully  debrided from soft tissue.  A 1 mm Kerrison was then able to be used to  carefully remove this osteophyte piecemeal a few millimeters at a time until  this was completely resected.  The cord was felt to be in excellent  condition.  Foraminotomy was performed bilaterally using 2 mm Kerrisons,  care taken not to exit too far out the neural foramen as the vertebral  artery was felt to be in the posterior third of the vertebral body in  location.  Following this, the spinal canal was felt to be adequately  decompressed.  Gelfoam, thrombin-soaked, was used to hemostase the end  plates.  Height of the intervertebral disk space measured using a sounder to  9 mm, and this was felt to be adequate height.  Bone graft was then  harvested from the right iliac crest using a 9 mm dual oscillating saw, a  depth gauge to measure the depth of the disk space, measuring it at about 19  mm.  The graft was cut and a graft depth of about 15 mm was chosen with a  height of 9 mm.  This was carefully tapered to the dimensions of the  intervertebral disk space.  The graft was placed as a J graft, removing one  cortex while on the lateral side of the graft  because the graft was felt to  be too wide for insertion without narrowing it slightly.  Following this,  inspection of the disk space demonstrated no further bony material or soft  tissue remaining that could be retropulsed with insertion of the graft after  it had been inserted into the disk space at C6-7 and then impacted into  place flush with the anterior surface of the anterior cortex at both C6 and  C7.  The screw post then removed at C7.  The Boss-McCullough retractor  completely removed, then replaced at the C5-6 level with the foot of the  blade beneath the border of the longus colli muscle on both sides.  Distraction obtained and exposure at the C5-6 level.  A 14 mm screw post  placed at the C5 level, distraction obtained across the C5-6 level.  Leksell  rongeur used to debride anterior lip osteophytes.  A 3 mm Kerrison used to  debride the anterior lip osteophytes, anterior annulus at this level.  A high-speed bur using the cutting bur was then used to carefully remove end  plates at the inferior aspect of C5, superior aspect of C7, from anterior to  posterior about 2-3 mm on either side of the disk space, extending back to  the posterior vertebral body of C5 and C6.  The diamond tip was then used to  carefully thin this area.  Then a microcurette used to make an initial entry  into the spinal canal over the lateral aspect of the posterior lip  osteophyte of the superior aspect of C6 on the left side. A 1 mm Kerrison  was then introduced and able to resect transversely caudal to the posterior  lip osteophytes, and also then it was passed cranial to the posterior  inferior osteophyte of C5, resecting the bony end plates and posterior lip  osteophytes here, decompressing the spinal canal quite nicely on both sides.  Posterior annulus and posterior longitudinal ligament were resected.  Centrally there was ossification of the posterior longitudinal ligament.  This bone debris was  carefully lifted away using 1 and 2 mm Kerrisons as  well as pituitary rongeurs.  The spinal cord was felt to be well-  decompressed following the decompression here of both sides, both cranial  and caudal to the disk space posterior lip osteophytes here.  With this,  then the height of the intervertebral disk space was measured at about 8 mm.  The depth measured with a Cloward depth gauge at 18 mm.  Note that both  foramina were foraminotomized using a 2 mm Kerrison, decompressing the  bilateral C6 nerve roots at this level.  With this, then an 8 mm graft was  chosen and a 9 mm dual oscillating saw was used to harvest the graft from  the right iliac crest bone graft harvest site, initially exposed, protecting  soft tissue structures medial and lateral, dividing the base of the base of  the graft using a quarter-inch osteotome.  This was carefully tapered to the  dimensions of the intervertebral disk space, carefully trimmed using the  oscillating saw down to an 8 mm size.  A J graft was made by removing one of  the side cortices of the graft in order to allow the graft to fit into the  intervertebral disk space.  The graft was excellent consistency.   The graft carefully trimmed to a depth of about 15 mm, a height of 8 mm.  Carefully tapered and able to be keyed into the intervertebral disk space.  The graft then placed int place.  Careful inspection demonstrated no bony  debris or soft tissue to be retropulsed with insertion of the graft.  The  graft was then impacted into place without difficulty flush with the  anterior portion of the anterior disk space at C5-6.   Screw posts were then removed from C5-6 anteriorly, bone wax applied to the  bleeding screw post hole.  Carefully under the microscope, then a high-speed  bur was used to carefully smooth the anterior lips of C5-6 and C6-7 to  accept the plate quite nicely, annealing to bone.  Using bone wax applied to a cottonoid string,  then the expected length of the plate was measured,  about 42 mm.  A 43 mm plate was initially placed over the anterior cervical  spine and it was felt to be too large.  A 40 mm plate was then placed,  carefully fixed using fixation pins to C6 and to C7.  Initial screw holes  were then placed at the C5 level after releasing traction, first on the  right side, then the left side, drilling 14 mm and placing 14 mm screws at  this level.  The retaining pin at the C6 level was then removed and screw  holes were then placed and 14 mm screws placed at this segment.  The  retaining pin at the C7 level of the left side was then removed and drill  holes placed and 14 mm screws placed here, fixing the plate and screws and  fixing the disk space at C5 through C7.  Irrigation was performed.  The  plate areas were then deformed in order to capture the screws at each  segment.  Intraoperative lateral radiograph demonstrated the screws to be  appropriate length without evidence of impingement on the cervical canal.  There was no evidence of retropulsion of bone material or metallic  instrumentation within the canal.   After further evaluation, careful inspection of the esophagus demonstrated  no  abnormalities.  The veins were left in place over the inferior thyroid  vessels were felt to be without any evidence of bleeding.  There was no  active bleeding present.  A 10 French TLS drain was placed in the depth of  the incision along the plate left side, exiting through a separate incision  along the anterior right side of the neck.  This was sewn in place with a 4-  0 nylon stitch.  The platysmal layer was then reapproximated with  interrupted 3-0 Vicryl sutures, the deep subcu layers with interrupted 3-0  Vicryl suture, and the skin closed with a running subcu stitch of 4-0  Vicryl, skin closed with Dermabond.  Dry dressing of 4 x 4's.  Right iliac  crest bone graft harvest site carefully hemostased using  bone wax applied to  the bleeding cancellous surfaces and Gelfoam.   The fascial layer of the abdomen approximated to the proximal thigh with  interrupted #1 Vicryl sutures, then 0 Vicryl used to approximate the subcu  layer, 2-0 Vicryl suture to approximate the more superficial layers and the  subcu fascia.  The skin closed with a running subcu stitch of 4-0 Vicryl.  Tincture of Benzoin and Steri-Strips applied to the right iliac crest bone  graft harvest site.  Four by fours fixed to the skin with Hypafix tape.  Anterior neck 4 x 4's fixed to the skin with Hypafix tape.  The TLS drain in  the neck was charged.  The patient then was placed in a soft cervical  collar.  She was then reactivated and extubated, returned to the recovery  room in satisfactory condition.  All instrument and sponge counts were  correct.      Kerrin Champagne, M.D.  Electronically Signed     JEN/MEDQ  D:  06/21/2005  T:  06/23/2005  Job:  [id number]

## 2010-09-01 NOTE — Discharge Summary (Signed)
[name], [name]              ACCOUNT NO.:  [id number]  MEDICAL RECORD NO.:  [id number]           PATIENT TYPE:  I  LOCATION:  5025                         FACILITY:  [facility]  PHYSICIAN:  Kerrin Champagne, M.D.   DATE OF BIRTH:  [birth year]  DATE OF ADMISSION:  07/17/2010 DATE OF DISCHARGE:  07/20/2010                              DISCHARGE SUMMARY   ADMISSION DIAGNOSES: 1. Left lateral recess stenosis L2-3, L3-4 with bilateral lateral     recess stenosis L4-5, disk tear right L5-S1 with foraminal stenosis     and severe degenerative disk disease. 2. Asthma with allergy at exacerbations. 3. Gastroesophageal reflux disease. 4. 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 5. Status post anterior cervical diskectomy and fusion at C5-6 with     subsequent removal of hardware for dysphasia.  DISCHARGE DIAGNOSES: 1. Left L2-3 lateral recess stenosis.  Left L3-4 lateral recess     stenosis with stenosis of the foramen secondary to hypertrophic     facet changes and disk bulge.  L4-5 bilateral lateral recess     stenosis.  Left disk tear at L4-5 with degenerative disk disease     and severe left L5 foraminal entrapment, status post previous     decompression with recurring stenosis at L5-S1.  Right S1 nerve     root bleb associated with minor-to-moderate tear requiring single 4-     0 Nurolon suture repair and DuraSeal. 2. Asthma with allergy at exacerbations. 3. Gastroesophageal reflux disease. 4. 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 5. Status post anterior cervical diskectomy and fusion at C5-6 with     subsequent removal of hardware for dysphasia. 6. Posthemorrhagic anemia.  PROCEDURE:  On July 17, 2010, the patient underwent left L5-S1 transforaminal lumbar interbody fusion with lordotic Concorde cage and local bone graft.  Central laminectomy at L4-5, L5-S1, and L3-4.  Left- sided L5-S1 total facetectomy for  decompression of the L5 nerve root. Foraminotomy left L4-5 with central lamina at this level left L2-3, lateral recess decompression, posterolateral fusion L4-5 and L5-S1 levels with local bone graft.  Posterior segment instrumentation using pedicle screws and rods L4-S1, dural repair right S1 dorsal nerve root using a single 4-0 Nurolon suture and DuraSeal.  This was performed by Dr. Otelia Sergeant assisted by Maud Deed, PA-C under general anesthesia.  CONSULTATIONS:  None.  BRIEF HISTORY:  The patient is a 55 year old female status post previous laminectomy, bilateral L5-S1 and left L4-5.  She has known degenerative disk disease at L5-S1.  Her pain has been persistent and increasing over the last few months.  She has exhausted conservative treatment including anti-inflammatory medication, narcotic analgesics, muscle relaxers, and physical therapy.  Further radiographic evaluation has shown severe recurrent foraminal stenosis affecting the left L5 nerve roots, bilateral lateral recess stenosis at L4-5 and disk injury at L4-5 with lateral recess stenosis left L3-4 and L2-3.  It was felt that she would require surgical intervention and was admitted for the procedure as stated above.  BRIEF HOSPITAL COURSE:  The patient tolerated the procedure under general anesthesia.  Postoperatively, she was kept flat at bedrest for the first 24 hours having had repair of a dural bleb.  Her pain was controlled with PCA analgesics and gradually she was weaned to p.o. analgesics without difficulty.  Diet was held until flatus and bowel function returned and then she was able to progress to a regular diet. Protonix was utilized p.o. as the patient did have history of gastroesophageal reflux.  The patient was able to start physical therapy on the evening of the first postoperative day.  She was fitted with an Aspen LSO brace to utilize when out of bed.  She was able to ambulate with physical therapy and  gradually was able to ambulate into the hallway with physical therapy.  Wound was checked daily with dressing changes and found to be healing well.  Hemovac drain was discontinued on the first postoperative day without difficulty.  The patient did have acute blood loss anemia with hemoglobin down to 9.0 and hematocrit 25.6 postoperatively.  She did have some mild symptoms of headache upon arising which was not prolonged and did clear once she was active.  No dizziness was noted.  She did not desire blood transfusion and therefore iron supplements were started.  The patient underwent a CT scan of the lumbar spine as she did have some complaints of left leg pain postoperatively.  The CT scan was reviewed by Dr. Otelia Sergeant and noted to be without acute changes.  Neurovascular motor function of the lower extremities remained intact throughout the hospital stay.  On July 20, 2010, she was felt stable for discharge to her home.  PERTINENT LABORATORY VALUES:  Hemoglobin and hematocrit as stated above. Chemistry studies on admission were within normal limits with exception of glucose 154.  No other labs were available on this chart at the time of this dictation.  PLAN:  The patient was discharged to her home.  She will be assisted by her husband.  She is instructed to ambulate weightbearing as tolerated and to use a walker for ambulation.  She will be allowed to shower and her wound may be wet for hygiene purposes.  She will change her dressing daily or as needed.  She is instructed to continue on a regular diet, but try to add more meat and green leafy vegetable for iron.  She will have no bending, lifting or twisting and she will wear her brace at all times when out of bed.  She will follow up with Dr. Otelia Sergeant in 2 weeks from the date of surgery.  She will obtain over-the-counter ferrous sulfate 325 mg to use three times daily and an over-the-counter stool softener as well as multivitamin.  She  will resume home medications as taken prior to admission and was given new prescriptions for gabapentin 300 mg twice daily, Robaxin 500 mg one every 8 hours as needed for spasm, Percocet 5/325 one to two every 4-6 hours as needed for pain. All questions were encouraged and answered prior to her discharge.  CONDITION ON DISCHARGE:  Stable.     Wende Neighbors, P.A.   ______________________________ Kerrin Champagne, M.D.    SMV/MEDQ  D:  08/02/2010  T:  08/03/2010  Job:  [id number]  Electronically Signed by Dorna Mai. on 08/23/2010 11:52:02 AM Electronically Signed by Vira Browns M.D. on 09/01/2010 07:46:06 PM

## 2011-01-09 LAB — TYPE AND SCREEN: Antibody Screen: NEGATIVE

## 2011-01-09 LAB — TROPONIN I: Troponin I: 0.01

## 2011-01-10 LAB — COMPREHENSIVE METABOLIC PANEL
ALT: 24
AST: 14
Alkaline Phosphatase: 80
CO2: 30
Calcium: 9.9
Chloride: 99
GFR calc Af Amer: >60
Glucose, Bld: 107 — ABNORMAL HIGH
Potassium: 4.7
Sodium: 137
Total Bilirubin: 0.4
Total Protein: 7.3

## 2011-01-10 LAB — CBC
MCHC: 34.3
RBC: 5.18 — ABNORMAL HIGH
RDW: 12.1

## 2013-03-12 ENCOUNTER — Other Ambulatory Visit (HOSPITAL_COMMUNITY): Payer: Self-pay | Admitting: Specialist

## 2013-03-24 ENCOUNTER — Encounter (HOSPITAL_COMMUNITY): Payer: Self-pay

## 2013-03-31 NOTE — Pre-Procedure Instructions (Addendum)
[name]  03/31/2013   Your procedure is scheduled on:  Tuesday, Jan. 13th   Report to [facility] cone short stay admitting at  5:30 AM.   Call this number if you have problems the morning of surgery: [phone]   Remember:   Do not eat food or drink liquids after midnight Monday.    Take these medicines the morning of surgery with A SIP OF WATER: Protonix,diltiazem         STOP all herbel meds, nsaids (aleve,naproxen,advil,ibuprofen) 5 days prior to surgery    Do not wear jewelry, make-up or nail polish.  Do not wear lotions, powders, or perfumes. You may NOT wear deodorant.  Do not shave underarms & legs 48 hours prior to surgery.    Do not bring valuables to the hospital.  [facility] is not responsible for any belongings or valuables.               Contacts, dentures or bridgework may not be worn into surgery.  Leave suitcase in the car. After surgery it may be brought to your room.  For patients admitted to the hospital, discharge time is determined by your treatment team.              Name and phone number of your driver:    Special Instructions: Shower using CHG 2 nights before surgery and the night before surgery.  If you shower the day of surgery use CHG.  Use special wash - you have one bottle of CHG for all showers.  You should use approximately 1/3 of the bottle for each shower.   Please read over the following fact sheets that you were given: Pain Booklet, Blood Transfusion Information, MRSA Information and Surgical Site Infection Prevention

## 2013-04-01 ENCOUNTER — Encounter (HOSPITAL_COMMUNITY)
Admission: RE | Admit: 2013-04-01 | Discharge: 2013-04-01 | Disposition: A | Discharge status: Home / Self Care | Payer: Medicare HMO | Source: Ambulatory Visit | Attending: Anesthesiology | Admitting: Anesthesiology

## 2013-04-01 ENCOUNTER — Encounter (HOSPITAL_COMMUNITY)
Admission: RE | Admit: 2013-04-01 | Discharge: 2013-04-01 | Disposition: A | Discharge status: Home / Self Care | Payer: Medicare HMO | Source: Ambulatory Visit | Attending: Specialist | Admitting: Specialist

## 2013-04-01 ENCOUNTER — Encounter (HOSPITAL_COMMUNITY): Payer: Self-pay

## 2013-04-01 DIAGNOSIS — Z01812 Encounter for preprocedural laboratory examination: Secondary | ICD-10-CM | POA: Insufficient documentation

## 2013-04-01 DIAGNOSIS — Z01818 Encounter for other preprocedural examination: Secondary | ICD-10-CM | POA: Insufficient documentation

## 2013-04-01 HISTORY — DX: Reserved for inherently not codable concepts without codable children: IMO0001

## 2013-04-01 HISTORY — DX: Malignant (primary) neoplasm, unspecified: C80.1

## 2013-04-01 HISTORY — DX: Essential (primary) hypertension: I10

## 2013-04-01 HISTORY — DX: Gastro-esophageal reflux disease without esophagitis: K21.9

## 2013-04-01 HISTORY — DX: Unspecified asthma, uncomplicated: J45.909

## 2013-04-01 HISTORY — DX: Personal history of other diseases of the digestive system: Z87.19

## 2013-04-01 HISTORY — DX: Unspecified osteoarthritis, unspecified site: M19.90

## 2013-04-01 LAB — COMPREHENSIVE METABOLIC PANEL
ALT: 9 U/L (ref 0–35)
AST: 9 U/L (ref 0–37)
Albumin: 3.9 g/dL (ref 3.5–5.2)
Alkaline Phosphatase: 90 U/L (ref 39–117)
BILIRUBIN TOTAL: 0.2 mg/dL — AB (ref 0.3–1.2)
BUN: 11 mg/dL (ref 6–23)
CALCIUM: 9.2 mg/dL (ref 8.4–10.5)
CHLORIDE: 104 meq/L (ref 96–112)
CO2: 27 meq/L (ref 19–32)
Creatinine, Ser: 0.92 mg/dL (ref 0.50–1.10)
GFR calc Af Amer: 78 mL/min — ABNORMAL LOW (ref >90)
GFR calc non Af Amer: 67 mL/min — ABNORMAL LOW (ref >90)
Glucose, Bld: 97 mg/dL (ref 70–99)
POTASSIUM: 3.5 meq/L — AB (ref 3.7–5.3)
SODIUM: 144 meq/L (ref 137–147)
Total Protein: 7.5 g/dL (ref 6.0–8.3)

## 2013-04-01 LAB — CBC
HCT: 41.4 % (ref 36.0–46.0)
HEMOGLOBIN: 14.6 g/dL (ref 12.0–15.0)
MCH: 29.7 pg (ref 26.0–34.0)
MCHC: 35.3 g/dL (ref 30.0–36.0)
MCV: 84.1 fL (ref 78.0–100.0)
PLATELETS: 339 10*3/uL (ref 150–400)
RBC: 4.92 MIL/uL (ref 3.87–5.11)
RDW: 12.5 % (ref 11.5–15.5)
WBC: 9.7 10*3/uL (ref 4.0–10.5)

## 2013-04-01 LAB — TYPE AND SCREEN
ABO/RH(D): B POS
Antibody Screen: NEGATIVE

## 2013-04-01 LAB — URINALYSIS, ROUTINE W REFLEX MICROSCOPIC
Bilirubin Urine: NEGATIVE
Glucose, UA: NEGATIVE mg/dL
Hgb urine dipstick: NEGATIVE
Ketones, ur: NEGATIVE mg/dL
Leukocytes, UA: NEGATIVE
NITRITE: NEGATIVE
Protein, ur: NEGATIVE mg/dL
Specific Gravity, Urine: 1.006 (ref 1.005–1.030)
Urobilinogen, UA: 0.2 mg/dL (ref 0.0–1.0)
pH: 5.5 (ref 5.0–8.0)

## 2013-04-01 LAB — SURGICAL PCR SCREEN
MRSA, PCR: NEGATIVE
Staphylococcus aureus: NEGATIVE

## 2013-04-02 NOTE — Progress Notes (Signed)
Anesthesia Chart Review:  Patient is a 58 year old female scheduled for extension of fusion from L4-S1 to L2-S1, transforaminal lumbar fusion right L2-3, L3-4 on 04/07/13 by Dr. Louanne Skye. History includes GERD, HTN, hiatal hernia, arthritis, asthma, non-smoker. She had mild OSA on sleep study from 10/2012.  PCP is Dr. Gar Ponto who cleared patient for this procedure.  EKG on 12/19/12 showed sinus arrhythmia, anterolateral T wave changes, consider ischemia. Nuclear stress test on 12/30/12 [facility]) showed no evidence of inducible ischemia, normal LV systolic function and wall motion, EF 77%, non-diagnostic ECG portion due to baseline ECG abnormalities.  Of note, the calculated TID ration is at the upper limits of normal.  Visually, the LV cavity does not appear to dilate with vasodilator administration.  Preoperative CXR and labs noted.  Anticipate that she can proceed as planned.  George Hugh [facility] Short Stay Center/Anesthesiology Phone [phone] 04/02/2013 10:06 AM

## 2013-04-03 NOTE — H&P (Signed)
[name] is an 58 y.o. female.   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  She underwent surgery in the form of right-sided microdiscectomy L5-S1 left-sided L5 foraminotomy with left-sided hemilaminectomy, lateral recess decompression L4-5 and L5-S1.  That surgery done in July 2008.  Postoperatively she had good improvement in her pain pattern.  Then had increasing discomfort and pain attended by increasing limitations of function and movement.  She was experiencing pain with standing, ambulation, any bending, stooping, or lifting.  We returned to the operating room in April 2012 and underwent lumbar fusion surgery at L4-5, L5-S1 levels.  She also underwent decompression of an area of lateral recess stenosis at the L2-3 level.  She indicates that she is not really feeling any better in regards to her back and that she is developing problems with increasing feelings of weakness and swelling in her legs and feet.Severe back pain and radiation to both her buttocks and legs.  She is having increasing problems with standing and walking tolerance.  Only able to stand in 1 place 5 minutes before she experiences severe back pain and discomfort and she has noticed increasing limitations in her ability to walk any distance.  She cannot walk a mile and notes that she has problems even walking half of a block due to back pain.     Her plain radiographs have demonstrated findings of increasing degeneration L2-3 and increasing retrolisthesis at this segment.  She has had discomfort over both thighs, the left and right side, left over the anterior aspect of the thigh and into the anterior shin which would be in an L4 distribution, but also numbness and tingling in both legs, left and right side.  EMGs of the lower extremities have been negative in the past.  MRI scan from March  2012 showed some spondylosis changes at L3-4, mild spinal stenosis and moderate left lateral recess stenosis at this level and at 2-3 an eccentric circumferential disk bulge causing mild-to-moderate stenosis at the 3-4 level.  No significant foraminal stenosis, chronic deviation, extra foraminal left L3 nerve root.  The results of her recent MRI on 01/2013 scan indicate that she has had progressive worsening of retrolisthesis of L2 and L3 nearly 7 mm of retrolisthesis with foraminal encroachment occurring.  Spinal canal though is decompressed centrally.  Discogenic edema noted at the L2-3 level.  At L3-4 she is noted to have disk bulge with facet hypertrophy, some mild foraminal narrowing.  Severe chronic deviation of the extraforaminal left L3 nerve root, moderate stenosis at this segment.   Past Medical History  Diagnosis Date  . Hypertension   . Asthma   . Cold     recent  rx  . GERD (gastroesophageal reflux disease)   . H/O hiatal hernia   . Arthritis   . Cancer     cervical , skin    Past Surgical History  Procedure Laterality Date  . Cervical conization w/bx    . Appendectomy    . Abdominal hysterectomy  04  . Cholecystectomy  03  . Cervical disc surgery  06  . Back surgery      x3  . Tonsillectomy      No family history on file. Social History:  reports that she has never smoked. She does not have any smokeless tobacco history on file. She reports that  she does not drink alcohol or use illicit drugs.  Allergies:  Allergies  Allergen Reactions  . Asa [Aspirin] Nausea And Vomiting  . Codeine Hives  . Morphine And Related     High blood pressure   . Sulfa Antibiotics Hives and Nausea And Vomiting    Medications Prior to Admission  Medication Sig Dispense Refill  . clonazePAM (KLONOPIN) 0.5 MG tablet Take 0.5 mg by mouth at bedtime.      Marland Kitchen diltiazem (CARDIZEM CD) 240 MG 24 hr capsule Take 240 mg by mouth daily.      Marland Kitchen estradiol (ESTRACE) 0.5 MG tablet Take 0.5 mg by  mouth daily.      . fluticasone (FLOVENT HFA) 110 MCG/ACT inhaler Inhale 1 puff into the lungs as needed.      . lidocaine (LIDODERM) 5 % Place 1 patch onto the skin daily. Remove & Discard patch within 12 hours or as directed by MD      . loratadine (CLARITIN) 10 MG tablet Take 10 mg by mouth daily.      . pantoprazole (PROTONIX) 40 MG tablet Take 40 mg by mouth 2 (two) times daily.        No results found for this or any previous visit (from the past 48 hour(s)). No results found.  Review of Systems  Neurological: Positive for sensory change and focal weakness.       Left leg.  Worse symptoms of neurogenic claudication with ambulation  All other systems reviewed and are negative.    Blood pressure 122/66, pulse 76, temperature 97.4 F (36.3 C), temperature source Oral, resp. rate 20, SpO2 97.00%. Physical Exam  Constitutional: She is oriented to person, place, and time. She appears well-developed and well-nourished.  HENT:  Head: Normocephalic and atraumatic.  Eyes: EOM are normal. Pupils are equal, round, and reactive to light.  Neck: Normal range of motion.  Cardiovascular: Normal rate, regular rhythm and normal heart sounds.   Respiratory: Effort normal and breath sounds normal.  GI: Soft. Bowel sounds are normal.  Musculoskeletal:   standing, heel-and-toe walking she is showing some tendencies to loss of balance.  Her strength including foot dorsiflexion, plantarflexion strength are normal.  Knee extensions and flexion strength were normal.  Hip abduction, adduction, and hip flexion strength is normal.  Her sciatic tension tests are unremarkable.  Popliteal compression signs is negative.    Neurological: She is alert and oriented to person, place, and time.  Skin: Skin is warm and dry.  Psychiatric: She has a normal mood and affect.     Assessment/Plan Spondylolisthesis L2-3 with foraminal stenosis. L3-4 degenerative disc disease and foraminal stenosis. Solid fusion  L4-S1  PLAN:  Extension of fusion from L4-S1 to L2-S1.  Revision of L4-S1 rods.  TLIF right L2-3 and L3-4 with rod and screw fixation with local bone graft and Conform DBX and bone marrow aspiration.  NITKA,JAMES E 04/07/2013, 7:20 AM

## 2013-04-06 MED ORDER — CEFAZOLIN SODIUM-DEXTROSE 2-3 GM-% IV SOLR
2.0000 g | INTRAVENOUS | Status: AC
  Administered 2013-04-07 (×2): 2 g via INTRAVENOUS
  Filled 2013-04-06 (×2): qty 50

## 2013-04-06 MED ORDER — CHLORHEXIDINE GLUCONATE 4 % EX LIQD: 60.0000 mL | Freq: Once | CUTANEOUS | Status: DC

## 2013-04-07 ENCOUNTER — Encounter (HOSPITAL_COMMUNITY)
Admission: RE | Disposition: A | Discharge status: Home Health | Payer: Medicare HMO | Source: Ambulatory Visit | Attending: Specialist

## 2013-04-07 ENCOUNTER — Encounter (HOSPITAL_COMMUNITY): Payer: Medicare HMO | Admitting: Vascular Surgery

## 2013-04-07 ENCOUNTER — Inpatient Hospital Stay (HOSPITAL_COMMUNITY)
Admission: RE | Admit: 2013-04-07 | Discharge: 2013-04-10 | DRG: 460 | Disposition: A | Discharge status: Home Health | Payer: Medicare HMO | Source: Ambulatory Visit | Attending: Specialist | Admitting: Specialist

## 2013-04-07 ENCOUNTER — Inpatient Hospital Stay (HOSPITAL_COMMUNITY): Payer: Medicare HMO

## 2013-04-07 ENCOUNTER — Inpatient Hospital Stay (HOSPITAL_COMMUNITY): Payer: Medicare HMO | Admitting: Anesthesiology

## 2013-04-07 ENCOUNTER — Encounter (HOSPITAL_COMMUNITY): Payer: Self-pay | Admitting: Certified Registered"

## 2013-04-07 DIAGNOSIS — J45909 Unspecified asthma, uncomplicated: Secondary | ICD-10-CM | POA: Diagnosis present

## 2013-04-07 DIAGNOSIS — Q762 Congenital spondylolisthesis: Secondary | ICD-10-CM

## 2013-04-07 DIAGNOSIS — Z981 Arthrodesis status: Secondary | ICD-10-CM

## 2013-04-07 DIAGNOSIS — I10 Essential (primary) hypertension: Secondary | ICD-10-CM | POA: Diagnosis present

## 2013-04-07 DIAGNOSIS — M47817 Spondylosis without myelopathy or radiculopathy, lumbosacral region: Principal | ICD-10-CM | POA: Diagnosis present

## 2013-04-07 DIAGNOSIS — K219 Gastro-esophageal reflux disease without esophagitis: Secondary | ICD-10-CM | POA: Diagnosis present

## 2013-04-07 DIAGNOSIS — M4310 Spondylolisthesis, site unspecified: Secondary | ICD-10-CM

## 2013-04-07 DIAGNOSIS — Z889 Allergy status to unspecified drugs, medicaments and biological substances status: Secondary | ICD-10-CM

## 2013-04-07 DIAGNOSIS — K449 Diaphragmatic hernia without obstruction or gangrene: Secondary | ICD-10-CM | POA: Diagnosis present

## 2013-04-07 DIAGNOSIS — M5137 Other intervertebral disc degeneration, lumbosacral region: Secondary | ICD-10-CM | POA: Diagnosis present

## 2013-04-07 DIAGNOSIS — M48062 Spinal stenosis, lumbar region with neurogenic claudication: Secondary | ICD-10-CM

## 2013-04-07 DIAGNOSIS — M129 Arthropathy, unspecified: Secondary | ICD-10-CM | POA: Diagnosis present

## 2013-04-07 DIAGNOSIS — M51379 Other intervertebral disc degeneration, lumbosacral region without mention of lumbar back pain or lower extremity pain: Secondary | ICD-10-CM | POA: Diagnosis present

## 2013-04-07 DIAGNOSIS — Z79899 Other long term (current) drug therapy: Secondary | ICD-10-CM

## 2013-04-07 HISTORY — DX: Fibromyalgia: M79.7

## 2013-04-07 HISTORY — DX: Shortness of breath: R06.02

## 2013-04-07 HISTORY — DX: Sleep apnea, unspecified: G47.30

## 2013-04-07 HISTORY — PX: LUMBAR FUSION: SHX111

## 2013-04-07 SURGERY — POSTERIOR LUMBAR FUSION 2 LEVEL
Anesthesia: General | Site: Spine Lumbar

## 2013-04-07 MED ORDER — SODIUM CHLORIDE 0.9 % IJ SOLN: 3.0000 mL | INTRAMUSCULAR | Status: DC | PRN

## 2013-04-07 MED ORDER — SODIUM CHLORIDE 0.9 % IJ SOLN
3.0000 mL | Freq: Two times a day (BID) | INTRAMUSCULAR | Status: DC
  Administered 2013-04-07 – 2013-04-08 (×2): 3 mL via INTRAVENOUS

## 2013-04-07 MED ORDER — HYDROMORPHONE HCL PF 1 MG/ML IJ SOLN
0.2500 mg | INTRAMUSCULAR | Status: DC | PRN
  Administered 2013-04-07 (×4): 0.5 mg via INTRAVENOUS

## 2013-04-07 MED ORDER — OXYCODONE-ACETAMINOPHEN 5-325 MG PO TABS
1.0000 | ORAL_TABLET | ORAL | Status: DC | PRN
  Administered 2013-04-08: 1 via ORAL
  Filled 2013-04-07: qty 1

## 2013-04-07 MED ORDER — DILTIAZEM HCL ER COATED BEADS 240 MG PO CP24
240.0000 mg | ORAL_CAPSULE | Freq: Every day | ORAL | Status: DC
  Administered 2013-04-08 – 2013-04-10 (×3): 240 mg via ORAL
  Filled 2013-04-07 (×3): qty 1

## 2013-04-07 MED ORDER — OXYCODONE HCL 5 MG PO TABS
ORAL_TABLET | ORAL | Status: AC
  Filled 2013-04-07: qty 1

## 2013-04-07 MED ORDER — PROPOFOL 10 MG/ML IV BOLUS
INTRAVENOUS | Status: DC | PRN
  Administered 2013-04-07: 140 mg via INTRAVENOUS

## 2013-04-07 MED ORDER — ZOLPIDEM TARTRATE 5 MG PO TABS
5.0000 mg | ORAL_TABLET | Freq: Every evening | ORAL | Status: DC | PRN
Start: 2013-04-07 — End: 2013-04-10

## 2013-04-07 MED ORDER — 0.9 % SODIUM CHLORIDE (POUR BTL) OPTIME
TOPICAL | Status: DC | PRN
  Administered 2013-04-07: 1000 mL

## 2013-04-07 MED ORDER — MENTHOL 3 MG MT LOZG: 1.0000 | LOZENGE | OROMUCOSAL | Status: DC | PRN

## 2013-04-07 MED ORDER — OXYCODONE HCL 5 MG PO TABS
5.0000 mg | ORAL_TABLET | Freq: Once | ORAL | Status: AC | PRN
  Administered 2013-04-07: 5 mg via ORAL

## 2013-04-07 MED ORDER — CLONAZEPAM 0.5 MG PO TABS
0.5000 mg | ORAL_TABLET | Freq: Every day | ORAL | Status: DC
  Administered 2013-04-07 – 2013-04-09 (×3): 0.5 mg via ORAL
  Filled 2013-04-07 (×3): qty 1

## 2013-04-07 MED ORDER — ROCURONIUM BROMIDE 100 MG/10ML IV SOLN
INTRAVENOUS | Status: DC | PRN
  Administered 2013-04-07: 40 mg via INTRAVENOUS
  Administered 2013-04-07 (×5): 10 mg via INTRAVENOUS

## 2013-04-07 MED ORDER — LORATADINE 10 MG PO TABS
10.0000 mg | ORAL_TABLET | Freq: Every day | ORAL | Status: DC
  Administered 2013-04-08 – 2013-04-10 (×3): 10 mg via ORAL
  Filled 2013-04-07 (×4): qty 1

## 2013-04-07 MED ORDER — ACETAMINOPHEN 650 MG RE SUPP: 650.0000 mg | RECTAL | Status: DC | PRN

## 2013-04-07 MED ORDER — HYDROCODONE-ACETAMINOPHEN 5-325 MG PO TABS
1.0000 | ORAL_TABLET | ORAL | Status: DC | PRN
  Administered 2013-04-07 – 2013-04-08 (×2): 1 via ORAL
  Filled 2013-04-07 (×2): qty 1

## 2013-04-07 MED ORDER — METHOCARBAMOL 100 MG/ML IJ SOLN
500.0000 mg | Freq: Four times a day (QID) | INTRAVENOUS | Status: DC | PRN
  Filled 2013-04-07: qty 5

## 2013-04-07 MED ORDER — THROMBIN 20000 UNITS EX SOLR
CUTANEOUS | Status: AC
  Filled 2013-04-07: qty 20000

## 2013-04-07 MED ORDER — KCL IN DEXTROSE-NACL 20-5-0.45 MEQ/L-%-% IV SOLN
INTRAVENOUS | Status: DC
  Administered 2013-04-08: 05:00:00 via INTRAVENOUS
  Filled 2013-04-07 (×9): qty 1000

## 2013-04-07 MED ORDER — EPHEDRINE SULFATE 50 MG/ML IJ SOLN
INTRAMUSCULAR | Status: DC | PRN
  Administered 2013-04-07: 10 mg via INTRAVENOUS

## 2013-04-07 MED ORDER — PHENOL 1.4 % MT LIQD: 1.0000 | OROMUCOSAL | Status: DC | PRN

## 2013-04-07 MED ORDER — SURGIFOAM 100 EX MISC
CUTANEOUS | Status: DC | PRN
  Administered 2013-04-07: 1 via TOPICAL

## 2013-04-07 MED ORDER — BUPIVACAINE-EPINEPHRINE 0.5% -1:200000 IJ SOLN
INTRAMUSCULAR | Status: DC | PRN
Start: 2013-04-07 — End: 2013-04-07
  Administered 2013-04-07: 20 mL

## 2013-04-07 MED ORDER — METHOCARBAMOL 500 MG PO TABS
ORAL_TABLET | ORAL | Status: AC
  Filled 2013-04-07: qty 1

## 2013-04-07 MED ORDER — HYDROMORPHONE HCL PF 1 MG/ML IJ SOLN
INTRAMUSCULAR | Status: AC
  Filled 2013-04-07: qty 1

## 2013-04-07 MED ORDER — ALBUMIN HUMAN 5 % IV SOLN
INTRAVENOUS | Status: DC | PRN
  Administered 2013-04-07: 09:00:00 via INTRAVENOUS

## 2013-04-07 MED ORDER — METHOCARBAMOL 500 MG PO TABS
500.0000 mg | ORAL_TABLET | Freq: Four times a day (QID) | ORAL | Status: DC | PRN
  Administered 2013-04-07 – 2013-04-09 (×2): 500 mg via ORAL
  Filled 2013-04-07 (×2): qty 1

## 2013-04-07 MED ORDER — SODIUM CHLORIDE 0.9 % IV SOLN: 250.0000 mL | INTRAVENOUS | Status: DC

## 2013-04-07 MED ORDER — SODIUM CHLORIDE 0.9 % IV SOLN
INTRAVENOUS | Status: DC | PRN
  Administered 2013-04-07: 13:00:00 via INTRAVENOUS

## 2013-04-07 MED ORDER — MIDAZOLAM HCL 5 MG/5ML IJ SOLN
INTRAMUSCULAR | Status: DC | PRN
  Administered 2013-04-07: 2 mg via INTRAVENOUS

## 2013-04-07 MED ORDER — PROMETHAZINE HCL 25 MG/ML IJ SOLN: 6.2500 mg | INTRAMUSCULAR | Status: DC | PRN

## 2013-04-07 MED ORDER — ONDANSETRON HCL 4 MG/2ML IJ SOLN
4.0000 mg | INTRAMUSCULAR | Status: DC | PRN
  Administered 2013-04-07 – 2013-04-08 (×2): 4 mg via INTRAVENOUS
  Filled 2013-04-07 (×2): qty 2

## 2013-04-07 MED ORDER — FLUTICASONE PROPIONATE HFA 110 MCG/ACT IN AERO
1.0000 | INHALATION_SPRAY | RESPIRATORY_TRACT | Status: DC | PRN
  Filled 2013-04-07: qty 12

## 2013-04-07 MED ORDER — OXYCODONE HCL 5 MG/5ML PO SOLN: 5.0000 mg | Freq: Once | ORAL | Status: AC | PRN

## 2013-04-07 MED ORDER — ACETAMINOPHEN 325 MG PO TABS
650.0000 mg | ORAL_TABLET | ORAL | Status: DC | PRN
  Administered 2013-04-08: 650 mg via ORAL
  Filled 2013-04-07 (×3): qty 2

## 2013-04-07 MED ORDER — ARTIFICIAL TEARS OP OINT
TOPICAL_OINTMENT | OPHTHALMIC | Status: DC | PRN
  Administered 2013-04-07: 1 via OPHTHALMIC

## 2013-04-07 MED ORDER — LIDOCAINE HCL (CARDIAC) 20 MG/ML IV SOLN
INTRAVENOUS | Status: DC | PRN
  Administered 2013-04-07: 40 mg via INTRAVENOUS

## 2013-04-07 MED ORDER — BUPIVACAINE-EPINEPHRINE (PF) 0.5% -1:200000 IJ SOLN
INTRAMUSCULAR | Status: AC
  Filled 2013-04-07: qty 10

## 2013-04-07 MED ORDER — CEFAZOLIN SODIUM 1-5 GM-% IV SOLN
1.0000 g | Freq: Three times a day (TID) | INTRAVENOUS | Status: AC
  Administered 2013-04-07 – 2013-04-08 (×2): 1 g via INTRAVENOUS
  Filled 2013-04-07 (×2): qty 50

## 2013-04-07 MED ORDER — PANTOPRAZOLE SODIUM 40 MG PO TBEC
40.0000 mg | DELAYED_RELEASE_TABLET | Freq: Two times a day (BID) | ORAL | Status: DC
  Administered 2013-04-07 – 2013-04-10 (×6): 40 mg via ORAL
  Filled 2013-04-07 (×6): qty 1

## 2013-04-07 MED ORDER — THROMBIN 20000 UNITS EX KIT
PACK | CUTANEOUS | Status: DC | PRN
  Administered 2013-04-07: 20000 [IU] via TOPICAL

## 2013-04-07 MED ORDER — FENTANYL CITRATE 0.05 MG/ML IJ SOLN
INTRAMUSCULAR | Status: DC | PRN
  Administered 2013-04-07: 50 ug via INTRAVENOUS
  Administered 2013-04-07: 25 ug via INTRAVENOUS
  Administered 2013-04-07: 100 ug via INTRAVENOUS
  Administered 2013-04-07 (×2): 50 ug via INTRAVENOUS

## 2013-04-07 MED ORDER — HYDROMORPHONE HCL PF 1 MG/ML IJ SOLN
0.5000 mg | INTRAMUSCULAR | Status: DC | PRN
  Administered 2013-04-07: 0.5 mg via INTRAVENOUS

## 2013-04-07 MED ORDER — ONDANSETRON HCL 4 MG/2ML IJ SOLN
INTRAMUSCULAR | Status: DC | PRN
  Administered 2013-04-07: 4 mg via INTRAVENOUS

## 2013-04-07 MED ORDER — LACTATED RINGERS IV SOLN
INTRAVENOUS | Status: DC | PRN
  Administered 2013-04-07 (×4): via INTRAVENOUS

## 2013-04-07 SURGICAL SUPPLY — 86 items
ADH SKN CLS APL DERMABOND .7 (GAUZE/BANDAGES/DRESSINGS) ×1
BLADE SURG 15 STRL LF DISP TIS (BLADE) IMPLANT
BLADE SURG 15 STRL SS (BLADE) ×3
BLADE SURG ROTATE 9660 (MISCELLANEOUS) IMPLANT
BUR ROUND FLUTED 4 SOFT TCH (BURR) ×2 IMPLANT
BUR ROUND FLUTED 4MM SOFT TCH (BURR) ×1
CAGE BULLET CONCORDE 9X10X27 (Cage) ×1 IMPLANT
CAGE BULLET CONCORDE 9X10X27MM (Cage) ×1 IMPLANT
CAGE CONCORDE BULLET 9X7X23 (Cage) ×1 IMPLANT
CAGE CONCORDE BULLET 9X7X23MM (Cage) ×1 IMPLANT
CANISTER SUCTION 2500CC (MISCELLANEOUS) ×2 IMPLANT
CLOTH BEACON ORANGE TIMEOUT ST (SAFETY) IMPLANT
CONNECTOR SFX SIZE A6 (Orthopedic Implant) ×2 IMPLANT
CORDS BIPOLAR (ELECTRODE) ×3 IMPLANT
COVER MAYO STAND STRL (DRAPES) ×4 IMPLANT
COVER SURGICAL LIGHT HANDLE (MISCELLANEOUS) ×3 IMPLANT
DERMABOND ADVANCED (GAUZE/BANDAGES/DRESSINGS) ×2
DERMABOND ADVANCED .7 DNX12 (GAUZE/BANDAGES/DRESSINGS) ×1 IMPLANT
DRAPE C-ARM 42X72 X-RAY (DRAPES) ×4 IMPLANT
DRAPE MICROSCOPE LEICA (MISCELLANEOUS) ×2 IMPLANT
DRAPE SURG 17X23 STRL (DRAPES) ×9 IMPLANT
DRAPE TABLE COVER HEAVY DUTY (DRAPES) ×3 IMPLANT
DRSG MEPILEX BORDER 4X12 (GAUZE/BANDAGES/DRESSINGS) ×3 IMPLANT
DRSG MEPILEX BORDER 4X4 (GAUZE/BANDAGES/DRESSINGS) ×2 IMPLANT
DRSG MEPILEX BORDER 4X8 (GAUZE/BANDAGES/DRESSINGS) IMPLANT
DURAPREP 26ML APPLICATOR (WOUND CARE) ×3 IMPLANT
ELECT BLADE 6.5 EXT (BLADE) ×2 IMPLANT
ELECT CAUTERY BLADE 6.4 (BLADE) ×3 IMPLANT
ELECT REM PT RETURN 9FT ADLT (ELECTROSURGICAL) ×3
ELECTRODE REM PT RTRN 9FT ADLT (ELECTROSURGICAL) ×1 IMPLANT
EVACUATOR 1/8 PVC DRAIN (DRAIN) ×3 IMPLANT
GLOVE BIO SURGEON STRL SZ 6.5 (GLOVE) ×1 IMPLANT
GLOVE BIO SURGEON STRL SZ7 (GLOVE) ×3 IMPLANT
GLOVE BIO SURGEONS STRL SZ 6.5 (GLOVE) ×1
GLOVE BIOGEL PI IND STRL 6.5 (GLOVE) IMPLANT
GLOVE BIOGEL PI IND STRL 7.5 (GLOVE) ×1 IMPLANT
GLOVE BIOGEL PI INDICATOR 6.5 (GLOVE) ×10
GLOVE BIOGEL PI INDICATOR 7.5 (GLOVE) ×2
GLOVE ECLIPSE 7.0 STRL STRAW (GLOVE) ×3 IMPLANT
GLOVE ECLIPSE 8.5 STRL (GLOVE) ×3 IMPLANT
GLOVE SURG 8.5 LATEX PF (GLOVE) ×3 IMPLANT
GLOVE SURG SS PI 6.5 STRL IVOR (GLOVE) ×9 IMPLANT
GOWN PREVENTION PLUS LG XLONG (DISPOSABLE) IMPLANT
GOWN STRL NON-REIN LRG LVL3 (GOWN DISPOSABLE) IMPLANT
GOWN STRL REUS W/ TWL LRG LVL3 (GOWN DISPOSABLE) ×5 IMPLANT
GOWN STRL REUS W/ TWL XL LVL3 (GOWN DISPOSABLE) IMPLANT
GOWN STRL REUS W/TWL 2XL LVL3 (GOWN DISPOSABLE) ×3 IMPLANT
GOWN STRL REUS W/TWL LRG LVL3 (GOWN DISPOSABLE) ×15
GOWN STRL REUS W/TWL XL LVL3 (GOWN DISPOSABLE) ×3
HEMOSTAT SURGICEL 2X14 (HEMOSTASIS) IMPLANT
KIT BASIN OR (CUSTOM PROCEDURE TRAY) ×3 IMPLANT
KIT POSITION SURG JACKSON T1 (MISCELLANEOUS) ×3 IMPLANT
KIT ROOM TURNOVER OR (KITS) ×3 IMPLANT
NDL ASP BONE MRW 11GX15 (NEEDLE) IMPLANT
NDL SPNL 18GX3.5 QUINCKE PK (NEEDLE) ×1 IMPLANT
NEEDLE 22X1 1/2 (OR ONLY) (NEEDLE) ×3 IMPLANT
NEEDLE ASP BONE MRW 11GX15 (NEEDLE) ×3 IMPLANT
NEEDLE BONE MARROW 8GAX6 (NEEDLE) IMPLANT
NEEDLE SPNL 18GX3.5 QUINCKE PK (NEEDLE) ×3 IMPLANT
NS IRRIG 1000ML POUR BTL (IV SOLUTION) ×3 IMPLANT
PACK LAMINECTOMY ORTHO (CUSTOM PROCEDURE TRAY) ×3 IMPLANT
PAD ARMBOARD 7.5X6 YLW CONV (MISCELLANEOUS) ×6 IMPLANT
PATTIES SURGICAL .75X.75 (GAUZE/BANDAGES/DRESSINGS) IMPLANT
PATTIES SURGICAL 1X1 (DISPOSABLE) ×3 IMPLANT
ROD PREBENT EXPEDIUM 6.35X85MM (Rod) ×2 IMPLANT
ROD STRAIGHT EXPED 6.35X120 (Rod) ×2 IMPLANT
SCREW POLY EXPED 6.35X4.35X40 (Screw) ×2 IMPLANT
SCREW POLY EXPEDIUM 6.35 5X40 (Screw) ×4 IMPLANT
SCREW SET EXPEDIUM 6.35 (Screw) ×27 IMPLANT
SHEET CONFORM 45LX20WX5H (Bone Implant) ×2 IMPLANT
SPONGE LAP 4X18 X RAY DECT (DISPOSABLE) ×4 IMPLANT
SPONGE SURGIFOAM ABS GEL 100 (HEMOSTASIS) ×3 IMPLANT
SUT VIC AB 0 CT1 27 (SUTURE) ×6
SUT VIC AB 0 CT1 27XBRD ANBCTR (SUTURE) ×2 IMPLANT
SUT VIC AB 1 CTX 36 (SUTURE) ×9
SUT VIC AB 1 CTX36XBRD ANBCTR (SUTURE) ×3 IMPLANT
SUT VIC AB 2-0 CT1 27 (SUTURE) ×6
SUT VIC AB 2-0 CT1 TAPERPNT 27 (SUTURE) ×2 IMPLANT
SUT VICRYL 0 CT 1 36IN (SUTURE) ×6 IMPLANT
SUT VICRYL 4-0 PS2 18IN ABS (SUTURE) ×3 IMPLANT
SYR CONTROL 10ML LL (SYRINGE) ×4 IMPLANT
TOWEL OR 17X24 6PK STRL BLUE (TOWEL DISPOSABLE) ×3 IMPLANT
TOWEL OR 17X26 10 PK STRL BLUE (TOWEL DISPOSABLE) ×3 IMPLANT
TRAY FOLEY CATH 16FRSI W/METER (SET/KITS/TRAYS/PACK) ×3 IMPLANT
WATER STERILE IRR 1000ML POUR (IV SOLUTION) ×3 IMPLANT
YANKAUER SUCT BULB TIP NO VENT (SUCTIONS) ×3 IMPLANT

## 2013-04-07 NOTE — Interval H&P Note (Signed)
History and Physical Interval Note:  04/07/2013 7:20 AM  [name]  has presented today for surgery, with the diagnosis of L2-3 spondylolisthesis with foraminal stenosis, L3-4 degenerative disc disease and foraminal stenosis, solid fusion L4-S1  The various methods of treatment have been discussed with the patient and family. After consideration of risks, benefits and other options for treatment, the patient has consented to  Procedure(s): Extension of fusion for L4-S1 to L2-S1, Revision of L4-S1 Rod, Transforaminal lumbar interbody fusion Right L2-3 and L3-4 with rod and screw internal fixation 3 segments, local bone graft, Conform DBX with bone marrow aspirate (N/A) as a surgical intervention .  The patient's history has been reviewed, patient examined, no change in status, stable for surgery.  I have reviewed the patient's chart and labs.  Questions were answered to the patient's satisfaction.     [name] E

## 2013-04-07 NOTE — Progress Notes (Signed)
Introduced self to patient. Significant other in room. Needs met. Call light within reach.

## 2013-04-07 NOTE — Anesthesia Procedure Notes (Signed)
Procedure Name: Intubation Date/Time: 04/07/2013 7:35 AM Performed by: Julian Reil Pre-anesthesia Checklist: Patient identified, Emergency Drugs available, Suction available and Patient being monitored Patient Re-evaluated:Patient Re-evaluated prior to inductionOxygen Delivery Method: Circle system utilized Preoxygenation: Pre-oxygenation with 100% oxygen Intubation Type: IV induction Ventilation: Mask ventilation without difficulty Laryngoscope Size: Mac and 3 Grade View: Grade I Tube type: Oral Tube size: 7.0 mm Number of attempts: 1 Airway Equipment and Method: Stylet Placement Confirmation: ETT inserted through vocal cords under direct vision,  positive ETCO2 and breath sounds checked- equal and bilateral Secured at: 21 cm Tube secured with: Tape Dental Injury: Teeth and Oropharynx as per pre-operative assessment

## 2013-04-07 NOTE — Transfer of Care (Signed)
Immediate Anesthesia Transfer of Care Note  Patient: [name]  Procedure(s) Performed: Procedure(s): Extension of fusion for L4-S1 to L2-S1, Revision of L4-S1 Rod, Transforaminal lumbar interbody fusion Right L2-3 and L3-4 with rod and screw internal fixation 3 segments, local bone graft, Conform sheet with bone marrow aspirate (N/A)  Patient Location: PACU  Anesthesia Type:General  Level of Consciousness: awake, alert , oriented and patient cooperative  Airway & Oxygen Therapy: Patient Spontanous Breathing and Patient connected to face mask oxygen  Post-op Assessment: Report given to PACU RN, Post -op Vital signs reviewed and stable and Patient moving all extremities  Post vital signs: Reviewed and stable  Complications: No apparent anesthesia complications

## 2013-04-07 NOTE — Preoperative (Signed)
Beta Blockers   Reason not to administer Beta Blockers:Not Applicable 

## 2013-04-07 NOTE — Op Note (Signed)
04/07/2013  2:18 PM  PATIENT:  [name]  58 y.o. female  MRN: [id number]  OPERATIVE REPORT  PRE-OPERATIVE DIAGNOSIS:  L2-3 spondylolisthesis with foraminal stenosis, L3-4 degenerative disc disease and foraminal stenosis, solid fusion L4-S1  POST-OPERATIVE DIAGNOSIS:  L2-3 spondylolisthesis with foraminal stenosis, L3-4 degenerative disc disease and foraminal stenosis, solid fusion L4-S1  PROCEDURE:  Procedure(s): Extension of fusion for L4-S1 to L2-S1, Revision of L4-S1 Rod, Transforaminal lumbar interbody fusion Right L2-3 and L3-4 with rod and screw internal fixation 3 segments, local bone graft, Conform sheet with bone marrow aspirate    SURGEON:  Jessy Oto, MD     ASSISTANT:  Phillips Hay, PA-C  (Present throughout the entire procedure and necessary for completion of procedure in a timely manner)     ANESTHESIA:  General, Dr. Orene Desanctis    COMPLICATIONS:  None.     COMPONENTS:Pedicle screws place left L2 and L3, right L3, unable to place right L2 pedicle screw, pedicle was small, L2-3 left TLIF with 7 mm concord cage(parallel) and (53mm lordotic) concord cage right L3-4, Rods right L3-S1 and Left L2-S1, transverse loading rod L3-4.Local bone graft and conform demineralized bone matrix with bone marrow aspirate left L3.   PROCEDURE: The patient was met in the holding area, and the appropriate right lumbar levels identified and marked with an "X" and my initials. I had discussion with the patient in the preop holding area regarding a change of consent form.The fusion levels are reidentified as L2-3 and L3-4 with removal of hardware at the previous fusion site L4-S1. This means extension of the fusion upwards to the L2-3 (Stable vertebrae level)Patient understands the rationale to perform TLIFs at two levels to decompress the bilateral L3-4 and L2-3 lateral recess and foramenal stenosis, Patient transported to OR and was placed under general anestheticwithout difficulty. The  patient received appropriate preoperative antibiotic prophylaxis Ancef.  Nursing staff inserted a Foley catheter under sterile conditions. The patient was then turned to a prone position using the Sidney spine frame. PAS. all pressure points well padded the arms at the side to 90 90. Standard prep with DuraPrep solution draped in the usual manner from the lower dorsal spine the mid sacral segment. Iodine Vi-Drape was used and the old incision scar was marked. Time-out procedure was called and correct. Skin in the midline between L1 and S2 was then infiltrated with Marcaine half percent with 1-200,000 epinephrine total of 10 cc used. Incision was then made ellipsing the old incision scar extending from L1-S2 through the skin and subcutaneous layers down to the patient's lumbodorsal fascia and spinous processes. The incision then carried sharply excising the supraspinous ligament and then continuing the lateral aspect of the spinous processes of L1, L2. S1 and S2.Belenda Cruise elevator used to carefully elevate the paralumbar muscles off of the posterior elements using electrocautery carefully drilled bleeding and perform dissection of the muscle tissues of the preserving the facet at the L1-2 level.. Continuing the exposure out laterally to expose the transverse process of L2, L3, L4, L5 and the sacrum in the midline and out to the sacra ala bilaterally exposing the Posterolateral fusion masses and pedicle screws at the L4-L5-S1 level. incision was carried in the midline down to the S2-S3 level area bleeders controlled using electrocautery monopolar electrocautery. The hardware at the L4-L5-S1 level, pedicle screws and rods identified as Expedium. . The rods were able to be removed by removing the individual screw caps with a small hex screw driver Then removing  any soft tissue about the rods and removing the rods .The pedicle Screws were then left in placed  [facility] #4 were then placed at the L3 transverse process  level observed on lateral C arm the penfield was at the L3 Pedicle and tranverse process of L3..Viper retractor was used for the lower part of the incision and the cerebellar retractor cranially. Spinous process of  L2, was then resected down to the base the lamina at each segment the lower 50% of the spinous process of L2 was resected and Leksell rongeur used to resect inferior aspect of the lamina on the left side at the L2 level and partially on the right side at L2. The left medial 40% of the facet of L2-3,  L3-4 were resected in order to decompress the left side of the lumbar thecal sac at L2.3, L:3-4 and decompress the left L2,L3 and L4 neuroforamen. Osteotomes and 5m and 353mkerrisons were used for this portion of the decompression. Similarly the right side decompression was carried out but complete facetectomies were perform on the right at L2-3 , L3-4 a to provide for exposure of the right side  L3-4 neuroforamen for ease of placement of TLIFs (transforaminal lumbar interbody fusion) at each level inferior portions of the lamina and pars were also resected first beginning with the Leksell rongeur and osteotomes and then resecting using 2 and 3 mm Kerrison. Then performing a resection of the right L3 lamina. The laminectomy was carried out resecting the central portions of the lamina of L2 and L3 performing foraminotomies on the right side at the L2, L3 and 4 levels. The inferior articular process of L3 was resected on the right side. The L4 nerve root identified bilaterally and the medial aspect of the L4 pedicle. Superior articular process of L4 was then resected from the right side further decompressing the right L4 nerve and providing for exposure of the area just superior to the L4 pedicle for a placement of cage. 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 Loupe  magnification and headlight were used during this portion procedure. C-arm fluoroscopy was then brought into the field and using C-arm fluoroscopy then a hole made into the lateral aspect of the pedicle of L2 observed in the pedicle using ball-tipped nerve hook and hockey stick nerve probe initial entry was determined on fluoroscopy to be good position alignment so that a ball handled probe was then used to probe the left L2 pedicle to a depth of nearly 40 mm observed on C-arm fluoroscopy to be beyond the midpoint of the lumbar vertebra and then position alignment within the left L2 pedicle this was then removed and the pedicle channel probed demonstrating patency no sign of rupture the cortex of the pedicle. Tapping with a 4 mm screw then 5.0 mm x 40 mm screw was placed on the left side at the L2 level. C-arm fluoroscopy was then brought into the field and using C-arm fluoroscopy then a hole made into the lateral aspect of the pedicle of L3 observed in the pedicle using ball tipped nerve hook and hockey stick nerve probe initial entry was determined on fluoroscopy to be good position alignment so that a ball handled probe was then used to probe the left L3 pedicle to a depth of nearly 40 mm observed on C-arm fluoroscopy to be beyond the midpoint of the lumbar vertebra  and then position alignment within the left L3 pedicle this was then removed and the pedicle channel probed demonstrating patency no sign of rupture the cortex of the pedicle. Tapping with a 4.25 mm tap then 5.0 mm x 76mm screw was placed on the left side at the L3 level C-arm fluoroscopy was then brought into the field and using C-arm fluoroscopy then a hole made into the lateral aspect of the left  pedicle of L2 observed in the pedicle using ball tipped nerve hook and hockey stick nerve probe initial entry was determined on fluoroscopy to be good position alignment so that a ball handled probe was then used to probe the left L2 pedicle to a depth of  nearly 40 mm observed on C-arm fluoroscopy to be beyond the midpoint of the lumbar vertebra and then position alignment within the left L2. pedicle this was then removed and the pedicle channel probed demonstrating patency no sign of rupture the cortex of the pedicle. Tapping with a 4.5 mm screw then 4.25 mm x 68mm screw was placed on the left side at the L2 level. The pedicle channel of L2  on the left probed demonstrating patency C-arm fluoroscopy was then brought into the field and using C-arm fluoroscopy then the hole into the pedicle of S1 observed with ball-tipped probe the previous S1 pedicle screw removed on this side was 7.0 mm x 92mm. A 8.12mm X 58mm predicle screw was placed on the left side at the S1 level. Attention then turned to the right side and changing to the sides of the table, the right-sided pedicle screws were passed in a similar fashion. C-arm fluoroscopy was used to localize the hole made in the lateral aspect of the pedicle of L2 on the right localizing the pedicle here we were unable to  placed the right L2 pedicle screw due to the small size of the pedicle.C-arm fluoroscopy was used to localize the hole made in the lateral aspect of the pedicle of L3 on the right localizing the pedicle within the spinal canal with nerve hook and hockey-stick nerve probe carefully passed down the center of the L3 pedicle to a depth of nearly 40 mm. Observed on C-arm fluoroscopy to be in good position alignment channel was probed with a ball-tipped probe ensure patency no sign of cortical disruption. Following tapping with a 4.25 mm tap and a 5.0 x 40 mm screw was placed on the right side pedicle at L3.  Attention then turned to placement of the transforaminal lumbar interbody fusion cages. Using a Penfield 4 the right lateral aspect of the thecal sac at the L3-4 disc space was carefully freed up The thecal sac could then easily be retracted in the posterior lateral aspect of the L3-4 disc was exposed 15  blade scalpel used to incise the posterolateral disc and an osteotome used to resect a small portion of bone off the superior aspect of the posterior superior vertebral body of L3 in order to ease the entry into the L3-4 disc space. A  62mm kerrison rongeur was then able to be introduced in the disc space debrided it was quite narrow. 7 mm dilator was used to dialate the L3-4 disc space on the right side attempts were made to dilate further to 9 mm were successful and using small curettes and the disc space was debrided a minimal degenerative disc present in the endplates debrided to bleeding endplate bone. A 9 mm x 21mm lordotic Concorde cage was carefully packed with  morcellized bone graft and the been harvested from previous laminotomies additional bone graft was then packed into the intervertebral disc space using the 7 mm trial  packed the graft multiple times. With this then a 8 mm x 104m lordotic cage was able to be introduced into the disc space on the right side using the correct degree convergence and then impacted then subset beneath the outer aspect of the disc space by about 3 or 4 mm. Bleeding controlled using bipolar electrocautery thrombin soaked gel cottonoids. Then turned to the right L3-4 level similarly the exposure the posterior lateral aspect this was carried out using a Penfield 4 bipolar electrocautery to control small bleeders present. Derricho retractor used to retract the thecal sac and L4 nerve root a 15 blade scalpel was used to incise posterior lateral aspect of the disc the disc space at this level showed a rather severe narrowing posteriorly was more open anteriorly so that an osteotome again was used to resect a small portion the posterior superior lip of the vertebral body at L4  in order to gain ease of access into the L3-4 disc space. The space was debrided of degenerative disc material using pituitary along root the entire disc space was then debrided of degenerative disc material  using pituitary rongeurs curettage down to bleeding bone endplates. Residual disc was resected using pituitary. This space was then carefully assess using spacers  a 9.085mx 2762mage provided the best fit the lordotic cage was chosen the intervertebral disc space was then packed with autogenous local bone graft that been harvested from the central laminectomy and this was pack multiple times and impacted with 7 mm trial cage apparatus. This provided excellent bone graft within the intervertebral disc space at L3-4 so that the permanent 9.0 mm cage by 27 mm was then packed with local bone graft placed into the intervertebral disc space and impacted into place in the correct degree of convergence. Bleeding controlled using bipolar electrocautery. The cage was subset beneath the posterior aspect of the L3-4 disc space by about 3-4 mm attempting to open the disc space on the right side than as with the L4-5 level. Bleeding hemostasis then attention was turned to the left side at the L2-3 level the left side portion of the disc was excised on left side then carefully retracting the thecal sac and the appropriate nerve L3 root  pituitary rongeurs were used to debride this cavity L2-3 level a lamina spreader was necessary in order to open the disc space wide enough to accept instruments to debride the intervertebral disc space. This the posterior inferior lip of L2 at the L2-3 disc. At the L2-3 level the disc was quite tight a 7 mm spreader was able to be introduced and then an 8mm60mreader. 7 mm x 23mm65mallel cage was chosen parallel type. A vertebral disc space was debrided of degenerative disc material and endplate and a curet and pituitary rongeurs. This was completely local bone graft was packed into the disc space multiple time using the smaller 7 mm trial to impact the graft. Finally the 7 mm x 23 mm parallel Concorde cage was packed with local bone graft and this was then impacted into the disc space on the left  side at L2-3.The cage was placed on the right side but also directed anteriorly in order to correct kyphosis noted to be present. Cage was subset beneath the posterior aspect of the disc space 2 or 3 mm. Observed on C-arm  fluoroscopy to be in good position alignment. The cages at L2-3 L3-4  were placed anteriorly as best as possible the correct patient's kyphosis that was present. With this then the transforaminal lumbar interbody fusion portion of the case was completed bleeders were controlled using bipolar electrocautery thrombin-soaked Gelfoam were appropriate. 5 screws on the left and 4 screws on the right and then each carefully aligned and tightened or loose and a slight amount to allow for placement of rods. Template was made along the right side first quarter inch titanium rod was then carefully contoured used using the template and divided appropriately. This was then placed into the pedicle screws on the right extending from L2-S1 each of the capsule and carefully placed loosely tightened. Attention turned to the left side were similarly and then screws were carefully adjusted to allow f and an _0  _1  and a and in _2  a _3  and and and and a in a in a in a _4  _5  _6  _7  _8  _9  _10  and a or a better pattern screws to allow for placement of fixation rod template for the rod was then taken and a quarter inch titanium rod was then carefully contoured. This was able to be inserted into the right pedicle screw fasteners and then using a persuader to place caps onto the right L4 fasteners the upper 2 pedicle screw caps appeared to fit quite well. The right S1 rod fastener cap was then tightened 85 pounds then on the right side disc space at L4-5 and L3-4 and L2-3 screw fasteners compression was obtained on the right side between L4 and  S1, L3 andL4, and L2 and L3 by compressing between the fasteners right hand side and tightening the screw caps 85 pounds. Similarly this was done on the left side at L5-S1, L4-5, L3-4 and L2-3 screws using slightl compression and tightened 85 pounds. Iirrigation was carried out with copious amounts of saline solution this was done throughout the case. Cell Saver was used during the case.250 cc returned to the patient. A single cross-link for the Depuy expedium system was placed at the L3-4 level measured with the measuring tool and the appropriate A6 cross-link was then then carefully contoured and applied to the rods and tightened again to 80 foot-pounds bilaterally using the appropriate torque screwdriver. Center screw on the cross-link was then carefully tightened 80 foot-pounds irrigation was carried out observation areas of dural tear demonstrated no leakage present. Permanent C-arm images were obtained in AP and lateral plane. Remaining local bone graft was then applied along the left lateral posterior lateral region extending from L2 through L5 transverse processes to the left L5-S1 postolateral fusion mass. Excess Gelfoam was then removed the lumbodorsal musculature carefully exam debrided of any devitalized tissue following removal of Vicryl retractors were the bleeders were controlled using electrocautery and the area dorsal lumbar muscle were then approximated in the midline with interrupted #1 Vicryl sutures loose the dorsal fascia was reattached to the spinous process of L1 to superiorly and S1 inferiorly this was done with #1 Vicryl sutures. Subcutaneous layers then approximated using interrupted 0 Vicryl sutures and 2-0 Vicryl sutures. Skin was closed with a running subcutaneous stitch of 4-0 Vicryl Dermabond was applied then MedPlex bandage. All  instrument and sponge counts were correct. The patient was then returned to a supine position on her bed reactivated extubated and returned to the recovery  room in satisfactory condition.   Phillips Hay PA-C perform the duties of assistant surgeon during this case. She was present from the beginning of the case to the end of the case assisting in transfer the patient from his stretcher to the OR table and back to the stretcher at the end of the case. Assisted in careful retraction and suction of the laminectomy site delicate neural structures operating under the operating room microscope. She performed closure of the incision from the fascia to the skin applying the dressing.           Beryl Hornberger E  04/07/2013, 2:18 PM

## 2013-04-07 NOTE — Anesthesia Preprocedure Evaluation (Signed)
Anesthesia Evaluation  Patient identified by MRN, date of birth, ID band Patient awake    Reviewed: Allergy & Precautions, H&P , NPO status , Patient's Chart, lab work & pertinent test results  Airway Mallampati: II  Neck ROM: Full    Dental   Pulmonary asthma ,  breath sounds clear to auscultation        Cardiovascular hypertension, Rhythm:Regular Rate:Normal     Neuro/Psych    GI/Hepatic hiatal hernia, GERD-  ,  Endo/Other    Renal/GU      Musculoskeletal   Abdominal   Peds  Hematology   Anesthesia Other Findings   Reproductive/Obstetrics                           Anesthesia Physical Anesthesia Plan  ASA: III  Anesthesia Plan: General   Post-op Pain Management:    Induction: Intravenous  Airway Management Planned: Oral ETT  Additional Equipment:   Intra-op Plan:   Post-operative Plan: Extubation in OR  Informed Consent: I have reviewed the patients History and Physical, chart, labs and discussed the procedure including the risks, benefits and alternatives for the proposed anesthesia with the patient or authorized representative who has indicated his/her understanding and acceptance.   Dental advisory given  Plan Discussed with: CRNA  Anesthesia Plan Comments:         Anesthesia Quick Evaluation

## 2013-04-07 NOTE — Anesthesia Postprocedure Evaluation (Signed)
  Anesthesia Post-op Note  Patient: [name]  Procedure(s) Performed: Procedure(s): Extension of fusion for L4-S1 to L2-S1, Revision of L4-S1 Rod, Transforaminal lumbar interbody fusion Right L2-3 and L3-4 with rod and screw internal fixation 3 segments, local bone graft, Conform sheet with bone marrow aspirate (N/A)  Patient Location: PACU  Anesthesia Type:General  Level of Consciousness: awake, alert , oriented and patient cooperative  Airway and Oxygen Therapy: Patient Spontanous Breathing  Post-op Pain: mild  Post-op Assessment: Post-op Vital signs reviewed, Patient's Cardiovascular Status Stable, Respiratory Function Stable, Patent Airway, No signs of Nausea or vomiting and Pain level controlled  Post-op Vital Signs: stable  Complications: No apparent anesthesia complications

## 2013-04-07 NOTE — Brief Op Note (Signed)
04/07/2013  1:37 PM  PATIENT:  [name]  58 y.o. female  PRE-OPERATIVE DIAGNOSIS:  L2-3 spondylolisthesis with foraminal stenosis, L3-4 degenerative disc disease and foraminal stenosis, solid fusion L4-S1  POST-OPERATIVE DIAGNOSIS:  L2-3 spondylolisthesis with foraminal stenosis, L3-4 degenerative disc disease and foraminal stenosis, solid fusion L4-S1, Unable to place right L2 pedicle screw, pedicle unable to be instrumented.  PROCEDURE:  Procedure(s): Extension of fusion for L4-S1 to L2-S1, Revision of L4-S1 Rod, Transforaminal lumbar interbody fusion Right L2-3 and L3-4 with rod and screw internal fixation 3 segments, local bone graft, Conform sheet with bone marrow aspirate (N/A)  SURGEON:  Surgeon(s) and Role:   Jessy Oto, MD - Primary  PHYSICIAN ASSISTANT: Phillips Hay, PA-C  ANESTHESIA:   general and supplemental local at the incision, Dr. Orene Desanctis.  EBL:  Total I/O In: 5397 [I.V.:2900; Blood:220; IV Piggyback:250] Out: 673 [Urine:145; Blood:600]  BLOOD ADMINISTERED:250 CC CELLSAVER  DRAINS: (One) Hemovact drain(s) in the right lower lumbar with  Suction Open and Urinary Catheter (Foley)   LOCAL MEDICATIONS USED:  MARCAINE    and Amount: 20 ml  SPECIMEN:  No Specimen  DISPOSITION OF SPECIMEN:  N/A  COUNTS:  YES  TOURNIQUET:  * No tourniquets in log *  DICTATION: .Dragon Dictation  PLAN OF CARE: Admit to inpatient   PATIENT DISPOSITION:  PACU - hemodynamically stable.   Delay start of Pharmacological VTE agent (>24hrs) due to surgical blood loss or risk of bleeding: yes

## 2013-04-07 NOTE — Discharge Instructions (Addendum)
°  Call if there is increasing drainage, fever greater than 101.5, severe head aches, and worsening nausea or light sensitivity. If shortness of breath, bloody cough or chest tightness or pain go to an emergency room. No lifting greater than 10 lbs. Avoid bending, stooping and twisting. Use brace when sitting and out of bed even to go to bathroom. Walk in house for first 2 weeks then may start to get out slowly increasing distances up to one mile by 4-6 weeks post op. After 5 days may shower and change dressing following bathing with shower.When bathing remove the brace shower and replace brace before getting out of the shower. If drainage, keep dry dressing and do not bathe the incision, use an moisture impervious dressing. Please call and return for scheduled follow up appointment 2 weeks from the time of surgery. Use the incentive spirometry hourly for at least 5 min when awake to improve lung function. Use your inhaler daily for one week.

## 2013-04-08 ENCOUNTER — Encounter (HOSPITAL_COMMUNITY): Payer: Self-pay | Admitting: General Practice

## 2013-04-08 LAB — CBC
HEMATOCRIT: 27.5 % — AB (ref 36.0–46.0)
HEMOGLOBIN: 9.6 g/dL — AB (ref 12.0–15.0)
MCH: 29.1 pg (ref 26.0–34.0)
MCHC: 34.9 g/dL (ref 30.0–36.0)
MCV: 83.3 fL (ref 78.0–100.0)
Platelets: 271 10*3/uL (ref 150–400)
RBC: 3.3 MIL/uL — AB (ref 3.87–5.11)
RDW: 13 % (ref 11.5–15.5)
WBC: 11.6 10*3/uL — ABNORMAL HIGH (ref 4.0–10.5)

## 2013-04-08 LAB — BASIC METABOLIC PANEL
BUN: 5 mg/dL — ABNORMAL LOW (ref 6–23)
CALCIUM: 7.8 mg/dL — AB (ref 8.4–10.5)
CO2: 27 meq/L (ref 19–32)
CREATININE: 0.78 mg/dL (ref 0.50–1.10)
Chloride: 97 mEq/L (ref 96–112)
GFR calc Af Amer: >90 mL/min (ref >90)
GFR calc non Af Amer: >90 mL/min (ref >90)
Glucose, Bld: 136 mg/dL — ABNORMAL HIGH (ref 70–99)
Potassium: 3.1 mEq/L — ABNORMAL LOW (ref 3.7–5.3)
Sodium: 134 mEq/L — ABNORMAL LOW (ref 137–147)

## 2013-04-08 MED ORDER — DIPHENHYDRAMINE HCL 25 MG PO CAPS
25.0000 mg | ORAL_CAPSULE | Freq: Four times a day (QID) | ORAL | Status: DC | PRN
  Administered 2013-04-08 – 2013-04-09 (×4): 25 mg via ORAL
  Administered 2013-04-10: 50 mg via ORAL
  Filled 2013-04-08 (×4): qty 1
  Filled 2013-04-08: qty 2

## 2013-04-08 MED ORDER — METOCLOPRAMIDE HCL 5 MG/ML IJ SOLN
10.0000 mg | Freq: Four times a day (QID) | INTRAMUSCULAR | Status: AC
  Administered 2013-04-08 – 2013-04-09 (×2): 10 mg via INTRAVENOUS
  Filled 2013-04-08 (×2): qty 2

## 2013-04-08 MED ORDER — SENNOSIDES-DOCUSATE SODIUM 8.6-50 MG PO TABS
1.0000 | ORAL_TABLET | Freq: Every day | ORAL | Status: DC
  Administered 2013-04-08 – 2013-04-09 (×2): 1 via ORAL
  Filled 2013-04-08 (×3): qty 1

## 2013-04-08 MED ORDER — KETOROLAC TROMETHAMINE 30 MG/ML IJ SOLN
30.0000 mg | Freq: Once | INTRAMUSCULAR | Status: AC
  Administered 2013-04-08: 30 mg via INTRAVENOUS
  Filled 2013-04-08: qty 1

## 2013-04-08 MED ORDER — BISACODYL 10 MG RE SUPP
10.0000 mg | Freq: Once | RECTAL | Status: AC
  Administered 2013-04-08: 10 mg via RECTAL
  Filled 2013-04-08: qty 1

## 2013-04-08 MED ORDER — FENTANYL 25 MCG/HR TD PT72
25.0000 ug | MEDICATED_PATCH | TRANSDERMAL | Status: DC
  Administered 2013-04-08: 25 ug via TRANSDERMAL
  Filled 2013-04-08: qty 1

## 2013-04-08 MED ORDER — FLEET ENEMA 7-19 GM/118ML RE ENEM
1.0000 | ENEMA | Freq: Once | RECTAL | Status: AC
  Administered 2013-04-08: 1 via RECTAL
  Filled 2013-04-08 (×2): qty 1

## 2013-04-08 MED ORDER — METOCLOPRAMIDE HCL 5 MG/ML IJ SOLN
10.0000 mg | Freq: Four times a day (QID) | INTRAMUSCULAR | Status: DC | PRN
  Administered 2013-04-08: 10 mg via INTRAVENOUS
  Filled 2013-04-08: qty 2

## 2013-04-08 MED ORDER — TRAMADOL HCL 50 MG PO TABS
100.0000 mg | ORAL_TABLET | Freq: Four times a day (QID) | ORAL | Status: DC | PRN
Start: 2013-04-08 — End: 2013-04-10
  Administered 2013-04-08: 50 mg via ORAL
  Administered 2013-04-09 – 2013-04-10 (×3): 100 mg via ORAL
  Filled 2013-04-08 (×6): qty 2

## 2013-04-08 NOTE — Progress Notes (Signed)
PT Cancellation Note  Patient Details Name: [name] MRN: [id number] DOB: [birth year]   Cancelled Treatment:    Reason Eval/Treat Not Completed: Other (comment) (brace is not fitting well per PA note. ) Will re-attempt to evaluate pt and ambulate when brace is appropriately fitting.    Gustavus Bryant, [place] [phone] 04/08/2013, 1:20 PM

## 2013-04-08 NOTE — Progress Notes (Signed)
Orthopedic Tech Progress Note Patient Details:  [name] [birth year] [id number] Brace completed by bio-tech Patient ID: [name], female   DOB: Dec 18, 1955, 58 y.o.   MRN: [id number]   Braulio Bosch 04/08/2013, 4:39 PM

## 2013-04-08 NOTE — Evaluation (Signed)
Occupational Therapy Evaluation Patient Details Name: [name] MRN: [id number] DOB: 04/29/1955 Today's Date: 04/08/2013 Time: 7619-5093 OT Time Calculation (min): 27 min  OT Assessment / Plan / Recommendation History of present illness Pt is a 57y/o s/p Extension of fusion for L4-S1 to L2-S1, Revision of L4-S1 Rod, Transforaminal lumbar interbody fusion Right L2-3 and L3-4 with rod and screw internal fixation 3 segments, local bone graft, Conform sheet with bone marrow aspirate (N/A)   Clinical Impression   Pt is a 57y/o female s/p fusion as above. Pt reports this is her "4th back surgery". Pt presents w/ deficits inhibiting her ability to perform ADL's and functional transfers at this time. She should benefit from acute OT to assist in maximizing independence prior to returning home w/ PRN husband assist. Assess toilet/shower transfers next visit.    OT Assessment  Patient needs continued OT Services    Follow Up Recommendations  Supervision - Intermittent;Supervision/Assistance - 24 hour    Barriers to Discharge      Equipment Recommendations  None recommended by OT    Recommendations for Other Services    Frequency  Min 2X/week    Precautions / Restrictions Precautions Precautions: Back Precaution Booklet Issued: Yes (comment) Precaution Comments: Pt issued and reviewed handout OI:ZTIW precautions. Required Braces or Orthoses: Spinal Brace Spinal Brace: Applied in sitting position;Other (comment) Spinal Brace Comments: Orders for back brace in chart, pt sitting up in chair w/o brace, no brace in pt room. RN notified. Restrictions Weight Bearing Restrictions: No   Pertinent Vitals/Pain BP 96/60 (69), 94% O2 on RA. 8/10 Mid low back pain/nausea/clammy feeling per pt report. RN made aware.Pt resting in chair/repositioned. Awaiting back brace.    ADL  Eating/Feeding: Performed;Independent Where Assessed - Eating/Feeding: Chair Grooming: Performed;Wash/dry  hands;Wash/dry face;Modified independent Where Assessed - Grooming: Supported sitting Upper Body Bathing: Simulated;Set up;Supervision/safety Where Assessed - Upper Body Bathing: Supported sitting Lower Body Bathing: Simulated;Minimal assistance Where Assessed - Lower Body Bathing: Supported sitting;Supported sit to stand Upper Body Dressing: Simulated;Set up;Minimal assistance Where Assessed - Upper Body Dressing: Supported sitting Lower Body Dressing: Simulated;Minimal assistance Where Assessed - Lower Body Dressing: Supported sit to stand;Supported sitting Toilet Transfer: Minimal assistance;Moderate assistance (Clinical judgement: Pt reports amb to BR just prior & states she did this w/o back brace, sitting in chair upon OT arrival. RN notified of back brace orders in sitting & aware. ) Toilet Transfer Method: Sit to stand Toilet Transfer Equipment: Bedside commode Toileting - Clothing Manipulation and Hygiene: Simulated;Supervision/safety Where Assessed - Toileting Clothing Manipulation and Hygiene: Sit to stand from 3-in-1 or toilet;Sit on 3-in-1 or toilet Tub/Shower Transfer Method: Not assessed Equipment Used: Other (comment) (Note: pt up in chair today upon OT arrival w/o back brace. Orders in chart for apply brace in sitting. Pt reports she has gone to bathroom already this am.) Transfers/Ambulation Related to ADLs: Pt c/o 8/10 pain, "Clammy" feeling and "I just feel awful all of a sudden" BP taken and 96/60 in sitting, out of chair/transfer back to bed deferred at this time secondary to low BP, pt c/o nausea and "clammy" feeling as well as fever earlier per her report.  Pt repositioned/reclined, rest, ice, fluids & blankets. Awaiting back brace. RN made aware. ADL Comments: Pt/husband were educated in back precautions/handout issued and reviewed. Discussed ADL's w/ back precautions as well. Pt noted to be up in chair prior to OT arrival w/o back brace. Pt reports she had also gome to  bathroom w/ assist. Pt w/ low  BP and increased pain/nausea. RN made aware of back brace orders. Defer OOB activity until BP improves and brace applied.    OT Diagnosis: Generalized weakness;Acute pain  OT Problem List: Decreased activity tolerance;Decreased knowledge of precautions;Decreased knowledge of use of DME or AE;Cardiopulmonary status limiting activity;Pain OT Treatment Interventions: Self-care/ADL training;Energy conservation;DME and/or AE instruction;Patient/family education;Therapeutic activities   OT Goals(Current goals can be found in the care plan section) Acute Rehab OT Goals Patient Stated Goal: Home w/ husband assist Time For Goal Achievement: 04/22/13 Potential to Achieve Goals: Good  Visit Information  Last OT Received On: 04/08/13 Assistance Needed: +1 History of Present Illness: Pt is a 57y/o s/p Extension of fusion for L4-S1 to L2-S1, Revision of L4-S1 Rod, Transforaminal lumbar interbody fusion Right L2-3 and L3-4 with rod and screw internal fixation 3 segments, local bone graft, Conform sheet with bone marrow aspirate (N/A)       Prior Functioning     Home Living Family/patient expects to be discharged to:: Private residence Living Arrangements: Non-relatives/Friends Available Help at Discharge: Family Type of Home: Mobile home Home Access: Stairs to enter Technical brewer of Steps: 4 STE Entrance Stairs-Rails: Can reach both Home Layout: One level Home Equipment: Bedside commode;Walker - 2 wheels Prior Function Level of Independence: Independent Communication Communication: No difficulties Dominant Hand: Right    Vision/Perception Vision - Assessment Vision Assessment: Vision not tested   Cognition  Cognition Arousal/Alertness: Awake/alert Behavior During Therapy: WFL for tasks assessed/performed Overall Cognitive Status: Within Functional Limits for tasks assessed    Extremity/Trunk Assessment Upper Extremity Assessment Upper Extremity  Assessment: Overall WFL for tasks assessed Lower Extremity Assessment Lower Extremity Assessment: Defer to PT evaluation    Mobility Bed Mobility General bed mobility comments: Pt up in chair upon OT arrival. Transfers Overall transfer level: Needs assistance General transfer comment: Pt up in chair upon OT arrival. Pt w/o back brace and w/ low BP/nausea. Deferred OOB transfers at this time, TBA.            End of Session OT - End of Session Equipment Utilized During Treatment: Other (comment) (Awaiting arrival of back brace, RN notified and aware) Activity Tolerance: Treatment limited secondary to medical complications (Comment);Other (comment) (Low BP 96/60 (69) 94% O2 RA, nausea and no back brace in room.) Patient left: in chair;with call bell/phone within reach;with family/visitor present;with nursing/sitter in room Nurse Communication: Precautions;Other (comment) (Low BP & need for Back Brace for OOB activity. )  GO     Carlynn Herald, Amy Beth Dixon 04/08/2013, 8:48 AM

## 2013-04-08 NOTE — Progress Notes (Signed)
Utilization review completed.  

## 2013-04-08 NOTE — Progress Notes (Signed)
Up to bedside commode tonight c/o some numbness both thighs, this may be due to pressure of pads on thighs vs Local swelling at the upper lumbar areas due to resection of scar and decompression of bilateral L2, L3 and L4 nerve Roots. Foley out and able to void. Trying to promote GI motility but this may take another day to return. Fentanyl patch is helping along with tramadol. PT/OT POD#1 Likely with ileus or hypoactive bowel due to prolong surgery, narcotics and bedrest. K is minimally decreased Recheck electrolytes and H?H in the AM.

## 2013-04-08 NOTE — Progress Notes (Signed)
Subjective: 1 Day Post-Op Procedure(s) (LRB): Extension of fusion for L4-S1 to L2-S1, Revision of L4-S1 Rod, Transforaminal lumbar interbody fusion Right L2-3 and L3-4 with rod and screw internal fixation 3 segments, local bone graft, Conform sheet with bone marrow aspirate (N/A) Patient reports pain as severe.  All pain meds cause nausea Nausea this am without vomiting.  No flatus or BM.  Large amt of burping oob to bathroom and urinating well.  Has brace in room but not adequate fit.  Will call [employer]otech IV in left hand infiltrated and not in use, pt reports it is sore and wants removed. Sitting in chair without brace.  Objective: Vital signs in last 24 hours: Temp:  [97.7 F (36.5 C)-101.9 F (38.8 C)] 98.4 F (36.9 C) (01/14 0701) Pulse Rate:  [75-118] 92 (01/14 0832) Resp:  [9-20] 18 (01/14 0510) BP: (96-134)/(52-98) 103/61 mmHg (01/14 0832) SpO2:  [90 %-100 %] 96 % (01/14 0832) Weight:  [78.2 kg (172 lb 6.4 oz)] 78.2 kg (172 lb 6.4 oz) (01/13 1013)  Intake/Output from previous day: 01/13 0701 - 01/14 0700 In: 5440 [P.O.:1120; I.V.:3850; Blood:220; IV Piggyback:250] Out: 2785 [NATFT:7322; Drains:390; Blood:600] Intake/Output this shift: Total I/O In: -  Out: 62 [Drains:60]   Recent Labs  04/08/13 0535  HGB 9.6*    Recent Labs  04/08/13 0535  WBC 11.6*  RBC 3.30*  HCT 27.5*  PLT 271    Recent Labs  04/08/13 0535  NA 134*  K 3.1*  CL 97  CO2 27  BUN 5*  CREATININE 0.78  GLUCOSE 136*  CALCIUM 7.8*   No results found for this basename: LABPT, INR,  in the last 72 hours  Neurovascular intact Sensation intact distally Intact pulses distally Dorsiflexion/Plantar flexion intact Incision: hemovac removed.  dressing intact. Abdomen distended with hypoactive BS  Assessment/Plan: 1 Day Post-Op Procedure(s) (LRB): Extension of fusion for L4-S1 to L2-S1, Revision of L4-S1 Rod, Transforaminal lumbar interbody fusion Right L2-3 and L3-4 with rod and screw  internal fixation 3 segments, local bone graft, Conform sheet with bone marrow aspirate (N/A) Up with therapy when brace appropriate.  Back to bed now.  Should have brace for sitting also Probable early ilieus. Clear liquids.  Reglan.  Continue IVF at 100/hr.  Supp. Today and laxative tonight. Pain control   Will try tramadol.  One dose Toradol now IV.  May eventually try fentanyl patch.   Syretta Kochel M 04/08/2013, 9:57 AM

## 2013-04-08 NOTE — Progress Notes (Signed)
Patient examined and note reviewed. 

## 2013-04-08 NOTE — Progress Notes (Signed)
Orthopedic Tech Progress Note Patient Details:  [name] [birth year] [id number]  Patient ID: [name], female   DOB: [birth year], 57 y.o.   MRN: [id number] [employer] brace order completed  Hildred Priest 04/08/2013, 10:00 AM

## 2013-04-08 NOTE — Progress Notes (Signed)
Patient given fleet enema but it did not work. Patient could not hold fluids in. The clear fluids came back out but no BM.

## 2013-04-08 NOTE — Progress Notes (Signed)
Clinical Social Worker will sign off for now as social work intervention is no longer needed. Please consult [name] again if new need arises.   Rhea Pink, MSW, [place]

## 2013-04-09 ENCOUNTER — Inpatient Hospital Stay (HOSPITAL_COMMUNITY): Payer: Medicare HMO

## 2013-04-09 LAB — CBC
HCT: 28.6 % — ABNORMAL LOW (ref 36.0–46.0)
Hemoglobin: 9.7 g/dL — ABNORMAL LOW (ref 12.0–15.0)
MCH: 29.2 pg (ref 26.0–34.0)
MCHC: 33.9 g/dL (ref 30.0–36.0)
MCV: 86.1 fL (ref 78.0–100.0)
Platelets: 236 10*3/uL (ref 150–400)
RBC: 3.32 MIL/uL — AB (ref 3.87–5.11)
RDW: 13 % (ref 11.5–15.5)
WBC: 14.9 10*3/uL — AB (ref 4.0–10.5)

## 2013-04-09 LAB — URINE MICROSCOPIC-ADD ON

## 2013-04-09 LAB — URINALYSIS, ROUTINE W REFLEX MICROSCOPIC
BILIRUBIN URINE: NEGATIVE
Bilirubin Urine: NEGATIVE
GLUCOSE, UA: NEGATIVE mg/dL
Glucose, UA: NEGATIVE mg/dL
HGB URINE DIPSTICK: NEGATIVE
Hgb urine dipstick: NEGATIVE
KETONES UR: NEGATIVE mg/dL
Ketones, ur: NEGATIVE mg/dL
LEUKOCYTES UA: NEGATIVE
LEUKOCYTES UA: NEGATIVE
Nitrite: NEGATIVE
Nitrite: NEGATIVE
PH: 6 (ref 5.0–8.0)
PH: 6.5 (ref 5.0–8.0)
Protein, ur: NEGATIVE mg/dL
Protein, ur: NEGATIVE mg/dL
SPECIFIC GRAVITY, URINE: 1.009 (ref 1.005–1.030)
SPECIFIC GRAVITY, URINE: 1.005 (ref 1.005–1.030)
UROBILINOGEN UA: 0.2 mg/dL (ref 0.0–1.0)
Urobilinogen, UA: 0.2 mg/dL (ref 0.0–1.0)

## 2013-04-09 LAB — BASIC METABOLIC PANEL
BUN: 5 mg/dL — ABNORMAL LOW (ref 6–23)
CO2: 26 meq/L (ref 19–32)
Calcium: 7.7 mg/dL — ABNORMAL LOW (ref 8.4–10.5)
Chloride: 97 mEq/L (ref 96–112)
Creatinine, Ser: 0.78 mg/dL (ref 0.50–1.10)
GFR calc Af Amer: >90 mL/min (ref >90)
Glucose, Bld: 157 mg/dL — ABNORMAL HIGH (ref 70–99)
Potassium: 3.6 mEq/L — ABNORMAL LOW (ref 3.7–5.3)
SODIUM: 134 meq/L — AB (ref 137–147)

## 2013-04-09 MED ORDER — ALBUTEROL SULFATE (2.5 MG/3ML) 0.083% IN NEBU: 2.5000 mg | INHALATION_SOLUTION | Freq: Four times a day (QID) | RESPIRATORY_TRACT | Status: DC | PRN

## 2013-04-09 MED ORDER — POLYETHYLENE GLYCOL 3350 17 G PO PACK
17.0000 g | PACK | Freq: Every day | ORAL | Status: DC
  Administered 2013-04-09 – 2013-04-10 (×2): 17 g via ORAL
  Filled 2013-04-09 (×3): qty 1

## 2013-04-09 MED ORDER — BISACODYL 5 MG PO TBEC: 10.0000 mg | DELAYED_RELEASE_TABLET | Freq: Every day | ORAL | Status: DC | PRN

## 2013-04-09 MED ORDER — ALBUTEROL SULFATE (2.5 MG/3ML) 0.083% IN NEBU
2.5000 mg | INHALATION_SOLUTION | RESPIRATORY_TRACT | Status: DC
  Administered 2013-04-09 (×2): 2.5 mg via RESPIRATORY_TRACT
  Filled 2013-04-09 (×2): qty 3

## 2013-04-09 MED FILL — Sodium Chloride IV Soln 0.9%: INTRAVENOUS | Qty: 2000 | Status: AC

## 2013-04-09 MED FILL — Heparin Sodium (Porcine) Inj 1000 Unit/ML: INTRAMUSCULAR | Qty: 30 | Status: AC

## 2013-04-09 NOTE — Progress Notes (Signed)
Placed patient on auto cpap. Max pressure of 18cm and min of 4cm. Patient tolerating only for a few minutes before wanting to take it off. RT encouraged patient to wear the cpap and educated on the benefits. RT will continue to monitor.

## 2013-04-09 NOTE — Progress Notes (Signed)
Patient refused CPAP tonight. There Is a machine in the room at this time. RN aware. Explained to Patient that if they changed their mind, to just have the RN call Respiratory and we would come set them up. 

## 2013-04-09 NOTE — Progress Notes (Signed)
Occupational Therapy Treatment Patient Details Name: [name] MRN: [id number] DOB: 08-Oct-1955 Today's Date: 04/09/2013 Time: 3762-8315 OT Time Calculation (min): 23 min  OT Assessment / Plan / Recommendation  History of present illness Pt is a 57y/o s/p Extension of fusion for L4-S1 to L2-S1, Revision of L4-S1 Rod, Transforaminal lumbar interbody fusion Right L2-3 and L3-4 with rod and screw internal fixation 3 segments, local bone graft, Conform sheet with bone marrow aspirate (N/A)   OT comments  Pt progressing to bathroom level this session. Pt is unable to don brace or complete posterior peri care at this time. Pt reports having boyfriends (A) upon d/c home. Pt could benefit from [facility] to address ADL deficits. Pt is high risk to break precautions due to inability to complete these task.   Follow Up Recommendations  Supervision - Intermittent;Supervision/Assistance - 24 hour;Home health OT (currently unable to don brace without (A))    Barriers to Discharge       Equipment Recommendations  None recommended by OT    Recommendations for Other Services    Frequency Min 2X/week   Progress towards OT Goals Progress towards OT goals: Progressing toward goals  Plan Discharge plan needs to be updated    Precautions / Restrictions Precautions Precautions: Back Spinal Brace: Applied in sitting position Spinal Brace Comments: pt required total (A) with brace unable to don independently. Pt reports boyfriend don each time   Pertinent Vitals/Pain 3 out 10     ADL  Eating/Feeding: Independent Where Assessed - Eating/Feeding: Chair Grooming: Wash/dry hands;Wash/dry face;Min guard Where Assessed - Grooming: Unsupported standing Toilet Transfer: Min Psychiatric nurse Method: Sit to Loss adjuster, chartered: Regular height toilet Toileting - Clothing Manipulation and Hygiene: Maximal assistance Where Assessed - Best boy and Hygiene: Sit to stand  from 3-in-1 or toilet Tub/Shower Transfer: Mining engineer: Walk in shower (simulated with box and doorway to bathroom) Equipment Used: Rolling walker;Back brace Transfers/Ambulation Related to ADLs: Pt navigating RW without (A) and requires v/c for hand placement ADL Comments: Pt supine on arrival and agreeable to OOB to transfer to 3n1 over toilet. pt required total (A) with brace. Pt v/c for hand placement over 3n1. Pt unable to complete peri care. Pt plans to have boyfriend (A) with peri care. pt ambualted to sink washing hands. pt return to sitting in chair.     OT Diagnosis:    OT Problem List:   OT Treatment Interventions:     OT Goals(current goals can now be found in the care plan section) Acute Rehab OT Goals Patient Stated Goal: Home w/ husband assist Time For Goal Achievement: 04/22/13 Potential to Achieve Goals: Good ADL Goals Pt Will Perform Grooming: with supervision;with modified independence;standing Pt Will Perform Lower Body Dressing: with min assist;with supervision;sit to/from stand Pt Will Transfer to Toilet: with supervision;ambulating;bedside commode Pt Will Perform Toileting - Clothing Manipulation and hygiene: with supervision;sit to/from stand Pt Will Perform Tub/Shower Transfer: Shower transfer;with supervision;with min guard assist;ambulating;3 in 1;rolling walker Additional ADL Goal #1: Pt will I'ly state 3/3 back precautions and implement during ADL's  Visit Information  Assistance Needed: +1 History of Present Illness: Pt is a 57y/o s/p Extension of fusion for L4-S1 to L2-S1, Revision of L4-S1 Rod, Transforaminal lumbar interbody fusion Right L2-3 and L3-4 with rod and screw internal fixation 3 segments, local bone graft, Conform sheet with bone marrow aspirate (N/A)    Subjective Data      Prior Functioning  Cognition  Cognition Arousal/Alertness: Awake/alert Behavior During Therapy: WFL for tasks  assessed/performed Overall Cognitive Status: Within Functional Limits for tasks assessed    Mobility       Exercises      Balance    End of Session OT - End of Session Activity Tolerance: Patient tolerated treatment well Patient left: in chair;with call bell/phone within reach Nurse Communication: Mobility status;Precautions  GO     Peri Maris 04/09/2013, 3:49 PM Pager: [phone]

## 2013-04-09 NOTE — Evaluation (Signed)
Physical Therapy Evaluation Patient Details Name: [name] MRN: [id number] DOB: 1955-06-22 Today's Date: 04/09/2013 Time: 0258-5277 PT Time Calculation (min): 27 min  PT Assessment / Plan / Recommendation History of Present Illness  Pt is a 57y/o s/p Extension of fusion for L4-S1 to L2-S1, Revision of L4-S1 Rod, Transforaminal lumbar interbody fusion Right L2-3 and L3-4 with rod and screw internal fixation 3 segments, local bone graft, Conform sheet with bone marrow aspirate (N/A)  Clinical Impression  This patient presents with acute pain and decreased functional independence following the above mentioned procedure. At the time of PT eval, pt was able to ambulate well with RW, however required frequent cueing throughout gait and transfer training to maintain back precautions. Pt declines donning brace herself, stating her significant other will do so, however he did not participate in donning brace during session. This patient is appropriate for skilled PT interventions to address functional limitations, improve safety and independence with functional mobility, and return to PLOF.     PT Assessment  Patient needs continued PT services    Follow Up Recommendations  Home health PT    Does the patient have the potential to tolerate intense rehabilitation      Barriers to Discharge        Equipment Recommendations  3in1 (PT)    Recommendations for Other Services     Frequency Min 5X/week    Precautions / Restrictions Precautions Precautions: Back Required Braces or Orthoses: Spinal Brace Spinal Brace: Applied in sitting position Spinal Brace Comments: Pt continues to report that signficant other will don brace for her despite encouragement to try on her own. States she can't see it well enough to try. Restrictions Weight Bearing Restrictions: No   Pertinent Vitals/Pain Pt's main complaint is dry mouth. Does not report pain throughout session.       Mobility  Bed  Mobility Overal bed mobility: Needs Assistance Bed Mobility: Rolling;Sidelying to Sit;Sit to Sidelying Rolling: Min guard (For tactile cueing) Sidelying to sit: Min assist Sit to sidelying: Min assist General bed mobility comments: Assist to come to full sit on EOB as well as for LE elevation back onto bed. VC's for sequencing and technique for log roll. Transfers Overall transfer level: Needs assistance Equipment used: Rolling walker (2 wheeled) Transfers: Sit to/from Stand Sit to Stand: Min guard General transfer comment: VC's for hand placement on seated surface for safety. Pt initially with hands on bed and then brought up to walker to pull to stand at last second.  Ambulation/Gait Ambulation/Gait assistance: Min guard Ambulation Distance (Feet): 100 Feet Assistive device: Rolling walker (2 wheeled) Gait Pattern/deviations: Step-through pattern;Decreased stride length Gait velocity: Decreased Gait velocity interpretation: Below normal speed for age/gender General Gait Details: VC's for sequencing and safety with the RW. Cueing also to maintain back precautions during turns. Pt on 3L/min supplemental O2 throughout ambulation.     Exercises     PT Diagnosis: Difficulty walking;Acute pain  PT Problem List: Decreased strength;Decreased range of motion;Decreased activity tolerance;Decreased balance;Decreased mobility;Decreased knowledge of precautions;Decreased knowledge of use of DME;Decreased safety awareness;Pain PT Treatment Interventions: DME instruction;Gait training;Stair training;Functional mobility training;Therapeutic activities;Therapeutic exercise;Neuromuscular re-education;Patient/family education     PT Goals(Current goals can be found in the care plan section) Acute Rehab PT Goals Patient Stated Goal: Home with significant other PT Goal Formulation: With patient/family Time For Goal Achievement: 04/23/13 Potential to Achieve Goals: Good  Visit Information  Last PT  Received On: 04/09/13 Assistance Needed: +1 History of Present Illness: Pt  is a 57y/o s/p Extension of fusion for L4-S1 to L2-S1, Revision of L4-S1 Rod, Transforaminal lumbar interbody fusion Right L2-3 and L3-4 with rod and screw internal fixation 3 segments, local bone graft, Conform sheet with bone marrow aspirate (N/A)       Prior Functioning  Home Living Family/patient expects to be discharged to:: Private residence Living Arrangements: Non-relatives/Friends Available Help at Discharge: Family Type of Home: Mobile home Home Access: Stairs to enter Technical brewer of Steps: 4 STE Entrance Stairs-Rails: Can reach both Home Layout: One level Home Equipment: Bedside commode;Walker - 2 wheels Prior Function Level of Independence: Independent Communication Communication: No difficulties Dominant Hand: Right    Cognition  Cognition Arousal/Alertness: Awake/alert Behavior During Therapy: WFL for tasks assessed/performed Overall Cognitive Status: Within Functional Limits for tasks assessed    Extremity/Trunk Assessment Upper Extremity Assessment Upper Extremity Assessment: Defer to OT evaluation Lower Extremity Assessment Lower Extremity Assessment: Overall WFL for tasks assessed Cervical / Trunk Assessment Cervical / Trunk Assessment: Normal   Balance Balance Overall balance assessment: No apparent balance deficits (not formally assessed)  End of Session PT - End of Session Equipment Utilized During Treatment: Gait belt;Oxygen Activity Tolerance: Patient tolerated treatment well Patient left: in bed;with call bell/phone within reach;with family/visitor present Nurse Communication: Mobility status  GP     [name] 04/09/2013, 4:38 PM  [name], Mildred, DPT [phone]

## 2013-04-09 NOTE — Progress Notes (Signed)
Patient ID: [name], female   DOB: [birth year], 58 y.o.   MRN: [id number] Subjective: 2 Days Post-Op Procedure(s) (LRB): Extension of fusion for L4-S1 to L2-S1, Revision of L4-S1 Rod, Transforaminal lumbar interbody fusion Right L2-3 and L3-4 with rod and screw internal fixation 3 segments, local bone graft, Conform sheet with bone marrow aspirate (N/A) Awake,alert and oriented x 4. Fleets yesterday no BM pos flatus though. On O2 per nasal cannula nursing notes Sats at 91-92 on O2. Patient reports pain as moderate.    Objective:   VITALS:  Temp:  [98 F (36.7 C)-98.8 F (37.1 C)] 98 F (36.7 C) (01/15 0425) Pulse Rate:  [78-110] 78 (01/15 0425) Resp:  [16-18] 18 (01/15 0425) BP: (103-136)/(48-70) 120/70 mmHg (01/15 0425) SpO2:  [92 %-96 %] 96 % (01/15 0425)  Neurologically intact ABD soft Neurovascular intact Sensation intact distally Intact pulses distally Dorsiflexion/Plantar flexion intact Incision: no drainage and no fluctuance No cellulitis present   LABS  Recent Labs  04/08/13 0535 04/09/13 0255  HGB 9.6* 9.7*  WBC 11.6* 14.9*  PLT 271 236    Recent Labs  04/08/13 0535 04/09/13 0255  NA 134* 134*  K 3.1* 3.6*  CL 97 97  CO2 27 26  BUN 5* 5*  CREATININE 0.78 0.78  GLUCOSE 136* 157*   No results found for this basename: LABPT, INR,  in the last 72 hours   Assessment/Plan: 2 Days Post-Op Procedure(s) (LRB): Extension of fusion for L4-S1 to L2-S1, Revision of L4-S1 Rod, Transforaminal lumbar interbody fusion Right L2-3 and L3-4 with rod and screw internal fixation 3 segments, local bone graft, Conform sheet with bone marrow aspirate (N/A) Hgb is stable but WBC is increased. Low sats on O2.  Advance diet Up with therapy D/C IV fluids Check PCXR r/o early pneumonia vs atelectasis. Check UA to r/o UTI Miralax to promote bowel function. PT/OT Surveillance for signs of periop pneumonia. Pulmonary Toilet, IS and Resp Rx is necessary.  NITKA,JAMES  E 04/09/2013, 8:15 AM

## 2013-04-09 NOTE — Progress Notes (Signed)
Assessment done and patient scored a 2. NO treatments indicated, no wheezes xray clear but low aeration, instructed patient on IS again and worked with her on importance. Changed treatments to PRN for SOB or Wheezes. atient is negative for any respiratory history.

## 2013-04-10 LAB — CBC
HEMATOCRIT: 27.6 % — AB (ref 36.0–46.0)
HEMOGLOBIN: 9.2 g/dL — AB (ref 12.0–15.0)
MCH: 28.8 pg (ref 26.0–34.0)
MCHC: 33.3 g/dL (ref 30.0–36.0)
MCV: 86.5 fL (ref 78.0–100.0)
Platelets: 256 10*3/uL (ref 150–400)
RBC: 3.19 MIL/uL — AB (ref 3.87–5.11)
RDW: 12.8 % (ref 11.5–15.5)
WBC: 13.6 10*3/uL — ABNORMAL HIGH (ref 4.0–10.5)

## 2013-04-10 MED ORDER — SENNOSIDES-DOCUSATE SODIUM 8.6-50 MG PO TABS: 1.0000 | ORAL_TABLET | Freq: Every day | ORAL | Status: DC

## 2013-04-10 MED ORDER — POLYETHYLENE GLYCOL 3350 17 G PO PACK: 17.0000 g | PACK | Freq: Every day | ORAL | Status: DC

## 2013-04-10 MED ORDER — LIDOCAINE 5 % EX PTCH: 1.0000 | MEDICATED_PATCH | CUTANEOUS | Status: DC

## 2013-04-10 MED ORDER — METHOCARBAMOL 500 MG PO TABS: 500.0000 mg | ORAL_TABLET | Freq: Three times a day (TID) | ORAL | Status: DC

## 2013-04-10 MED ORDER — DIPHENHYDRAMINE HCL 25 MG PO CAPS: 25.0000 mg | ORAL_CAPSULE | Freq: Three times a day (TID) | ORAL | Status: DC | PRN

## 2013-04-10 MED ORDER — TRAMADOL HCL 50 MG PO TABS: 50.0000 mg | ORAL_TABLET | Freq: Four times a day (QID) | ORAL | Status: DC | PRN

## 2013-04-10 MED ORDER — ONDANSETRON HCL 4 MG PO TABS: 4.0000 mg | ORAL_TABLET | Freq: Three times a day (TID) | ORAL | Status: DC | PRN

## 2013-04-10 MED ORDER — FENTANYL 25 MCG/HR TD PT72: 25.0000 ug | MEDICATED_PATCH | TRANSDERMAL | Status: DC

## 2013-04-10 NOTE — Care Management Note (Signed)
CARE MANAGEMENT NOTE 04/10/2013  Patient:  [name], [name]   Account Number:  [id number]  Date Initiated:  04/08/2013  Documentation initiated by:  Ricki Miller  Subjective/Objective Assessment:   58 yr old female s/p extension of fusion,L2-3,L3-4, L4-S1 top L2-S1     Action/Plan:   PT/OT eval   Anticipated DC Date:  04/10/2013   Anticipated DC Plan:  [place]  CM consult      PAC Choice  DURABLE MEDICAL EQUIPMENT   Choice offered to / List presented to:     DME arranged  3-N-1      DME agency  [employer]     HH arranged  NA      Status of service:  Completed, signed off Medicare Important Message given?   (If response is "NO", the following Medicare IM given date fields will be blank) Date Medicare IM given:   Date Additional Medicare IM given:    Discharge Disposition:  HOME/SELF CARE

## 2013-04-10 NOTE — Progress Notes (Signed)
Patient ID: ARACELLY TENCZA, female   DOB: 05/14/1955, 58 y.o.   MRN: [id number] Subjective: 3 Days Post-Op Procedure(s) (LRB): Extension of fusion for L4-S1 to L2-S1, Revision of L4-S1 Rod, Transforaminal lumbar interbody fusion Right L2-3 and L3-4 with rod and screw internal fixation 3 segments, local bone graft, Conform sheet with bone marrow aspirate (N/A) Awake, alert and oriented x 4. Pain controlled with fentanyl patch 25 mcg and intermittant tramadol. Voiding without difficulty. Afebrile. Sats improved with Breathing treatments. Recommend that she continue breathing treatments with her inhaler every day at home for one week then as needed.  Patient reports pain as moderate.    Objective:   VITALS:  Temp:  [97.8 F (36.6 C)-99.1 F (37.3 C)] 97.8 F (36.6 C) (01/16 0500) Pulse Rate:  [103-104] 104 (01/16 0500) Resp:  [18-20] 18 (01/16 0500) BP: (95-119)/(59-74) 119/59 mmHg (01/16 0500) SpO2:  [94 %-96 %] 94 % (01/16 0500)  Neurologically intact ABD soft Neurovascular intact Sensation intact distally Intact pulses distally Dorsiflexion/Plantar flexion intact Incision: no drainage No cellulitis present New dressing applied.   LABS  Recent Labs  04/08/13 0535 04/09/13 0255 04/10/13 0415  HGB 9.6* 9.7* 9.2*  WBC 11.6* 14.9* 13.6*  PLT 271 236 256    Recent Labs  04/08/13 0535 04/09/13 0255  NA 134* 134*  K 3.1* 3.6*  CL 97 97  CO2 27 26  BUN 5* 5*  CREATININE 0.78 0.78  GLUCOSE 136* 157*  Urinalysis negative for UTI. No results found for this basename: LABPT, INR,  in the last 72 hours   Assessment/Plan: 3 Days Post-Op Procedure(s) (LRB): Extension of fusion for L4-S1 to L2-S1, Revision of L4-S1 Rod, Transforaminal lumbar interbody fusion Right L2-3 and L3-4 with rod and screw internal fixation 3 segments, local bone graft, Conform sheet with bone marrow aspirate (N/A) Decreased sats due to decreased activity low lung volumns due to poor effort and  narcotics. This is improved with inhalant treatment and IS.  Advance diet Up with therapy D/C IV fluids Discharge home with home health Pulmonary toilet important hourly at home and the use of inhalant aerosol treatment at home. Benadryl for itching. NITKA,JAMES E 04/10/2013, 8:27 AM

## 2013-04-10 NOTE — Addendum Note (Signed)
Addendum created 04/10/13 1927 by Kate Sable, MD   Modules edited: Anesthesia Responsible Staff

## 2013-04-10 NOTE — Progress Notes (Signed)
Occupational Therapy Treatment Patient Details Name: [name] MRN: [id number] DOB: [birth year] Today's Date: 04/10/2013 Time: 9509-3267 OT Time Calculation (min): 10 min  OT Assessment / Plan / Recommendation  History of present illness Pt is a 57y/o s/p Extension of fusion for L4-S1 to L2-S1, Revision of L4-S1 Rod, Transforaminal lumbar interbody fusion Right L2-3 and L3-4 with rod and screw internal fixation 3 segments, local bone graft, Conform sheet with bone marrow aspirate (N/A)   OT comments  Pt. Sig. Other present and encouraged to assist pt. In prep for safe return home.  Sig. Other able to don back brace with no adjustments or cues required for proper fit.  Pt. And sig. Other declined any further practice or review of adls in prep for home.  Both verbalize they understand how to "help" each other and feel they will be fine.  Pt. Very agitated stating over and over, "i just want to go home, when is that going to happen".    Follow Up Recommendations  Supervision - Intermittent           Equipment Recommendations  None recommended by OT        Frequency Min 2X/week   Progress towards OT Goals Progress towards OT goals: Progressing toward goals  Plan Discharge plan remains appropriate    Precautions / Restrictions Precautions Precautions: Back Precaution Comments: Pt issued and reviewed handout TI:WPYK precautions. Required Braces or Orthoses: Spinal Brace Spinal Brace: Applied in sitting position Spinal Brace Comments: pts sig. other was present and able to don brace for pt with no verbal cues required   Pertinent Vitals/Pain 6-8/10, repositioned from eob to recliner    ADL  Upper Body Dressing: Performed;+1 Total assistance;Other (comment) (sig. other donned brace for pt. ) Where Assessed - Upper Body Dressing: Unsupported sitting Toilet Transfer: Simulated;Min guard Toilet Transfer Method: Stand pivot;Sit to stand Toileting - Water quality scientist and Hygiene:  Simulated;Maximal assistance Where Assessed - Toileting Clothing Manipulation and Hygiene: Standing Transfers/Ambulation Related to ADLs: stand-pivot with rw and sig. other assisting with initiating movement of walker ADL Comments: pt. sig other initially "waved" the brace at me to come over and don for pt. as i entered the room but i reviewed with him that i was actually here to see him do it to ensure safety at home.  he then donned the brace with no cues or correction needed.        OT Goals(current goals can now be found in the care plan section)    Visit Information  Last OT Received On: 04/10/13 History of Present Illness: Pt is a 57y/o s/p Extension of fusion for L4-S1 to L2-S1, Revision of L4-S1 Rod, Transforaminal lumbar interbody fusion Right L2-3 and L3-4 with rod and screw internal fixation 3 segments, local bone graft, Conform sheet with bone marrow aspirate (N/A)    Subjective Data   (see above)          Cognition  Cognition Arousal/Alertness: Awake/alert Behavior During Therapy: Agitated Overall Cognitive Status: Within Functional Limits for tasks assessed    Mobility  Bed Mobility General bed mobility comments: pt. was eob upon arrival Transfers Overall transfer level: Needs assistance Equipment used: Rolling walker (2 wheeled) Transfers: Sit to/from [facility] Sit to Stand: Min guard General transfer comment: cues for hand placement on arm rests and cues for controlled sitting              End of Session OT - End of Session Equipment Utilized  During Treatment: Rolling walker;Back brace Activity Tolerance: Patient tolerated treatment well;Treatment limited secondary to agitation Patient left: in chair;with call bell/phone within reach;with family/visitor present       Janice Coffin, COTA/L 04/10/2013, 8:39 AM

## 2013-04-10 NOTE — Progress Notes (Signed)
Physical Therapy Treatment Patient Details Name: [name] MRN: [id number] DOB: 04/05/1955 Today's Date: 04/10/2013 Time: 1037-1100 PT Time Calculation (min): 23 min  PT Assessment / Plan / Recommendation  History of Present Illness Pt is a 57y/o s/p Extension of fusion for L4-S1 to L2-S1, Revision of L4-S1 Rod, Transforaminal lumbar interbody fusion Right L2-3 and L3-4 with rod and screw internal fixation 3 segments, local bone graft, Conform sheet with bone marrow aspirate (N/A)   PT Comments   Pt progressing with mobility/PT goals.  Pt very eager to d/c home.    Follow Up Recommendations  Home health PT     Does the patient have the potential to tolerate intense rehabilitation     Barriers to Discharge        Equipment Recommendations  3in1 (PT)    Recommendations for Other Services    Frequency Min 5X/week   Progress towards PT Goals Progress towards PT goals: Progressing toward goals  Plan Current plan remains appropriate    Precautions / Restrictions Precautions Precautions: Back Precaution Comments: Pt able to verbalize 3/3 back precautions but needed cues to reinforce with functional mobility Required Braces or Orthoses: Spinal Brace Spinal Brace: Applied in sitting position Restrictions Weight Bearing Restrictions: No   Pertinent Vitals/Pain 3-4/10 back.  Premedicated.     Mobility  Bed Mobility Overal bed mobility: Needs Assistance Bed Mobility: Sidelying to Sit Sidelying to sit: Supervision General bed mobility comments: Pt relied heavily on rail to sit upright Transfers Overall transfer level: Needs assistance Equipment used: Rolling walker (2 wheeled) Transfers: Sit to/from Stand Sit to Stand: Min guard General transfer comment: cues for hand placement & back precautions Ambulation/Gait Ambulation/Gait assistance: Min guard Ambulation Distance (Feet): 100 Feet Assistive device: Rolling walker (2 wheeled) Gait Pattern/deviations: Step-to  pattern;Decreased step length - right;Decreased step length - left Gait velocity: Decreased Gait velocity interpretation: Below normal speed for age/gender General Gait Details: Pt with very slow small shuffling steps.  Would take ~4-5 steps & then pause for brief period.    Stairs: Yes Stairs assistance: Min assist Stair Management: One rail Left;Step to pattern;Forwards Number of Stairs: 4 General stair comments: cues for sequencing & technique.  (A) provided via HHA on Rt side due to unable to reach both rails at in stairwell but pt states she will be able to reach bil rails at home.      Exercises     PT Diagnosis:    PT Problem List:   PT Treatment Interventions:     PT Goals (current goals can now be found in the care plan section) Acute Rehab PT Goals PT Goal Formulation: With patient/family Time For Goal Achievement: 04/23/13 Potential to Achieve Goals: Good  Visit Information  Last PT Received On: 04/10/13 Assistance Needed: +1 History of Present Illness: Pt is a 57y/o s/p Extension of fusion for L4-S1 to L2-S1, Revision of L4-S1 Rod, Transforaminal lumbar interbody fusion Right L2-3 and L3-4 with rod and screw internal fixation 3 segments, local bone graft, Conform sheet with bone marrow aspirate (N/A)    Subjective Data      Cognition  Cognition Arousal/Alertness: Awake/alert Behavior During Therapy: WFL for tasks assessed/performed Overall Cognitive Status: Within Functional Limits for tasks assessed    Balance     End of Session PT - End of Session Equipment Utilized During Treatment: Back brace Activity Tolerance: Patient tolerated treatment well Patient left: in chair;with call bell/phone within reach;with family/visitor present Nurse Communication: Mobility status   GP  Sena Hitch 04/10/2013, 2:39 PM  Sarajane Marek, PTA [phone] 04/10/2013

## 2013-04-20 NOTE — Discharge Summary (Signed)
Physician Discharge Summary  Patient ID: GWENDOLYNE WELFORD MRN: [id number] DOB/AGE: 59-Feb-1957 59 y.o.  Admit date: 04/07/2013 Discharge date: 04/10/2013  Admission Diagnoses:  Spinal stenosis, lumbar region, with neurogenic claudication L2-3 spondylolisthesis with foraminal stenosis, L3-4 degenerative disc disease and foraminal stenosis, solid fusion L4-S1  Discharge Diagnoses:  Principal Problem:   Spinal stenosis, lumbar region, with neurogenic claudication Active Problems:   Degenerative spondylolisthesis   Spinal stenosis of lumbar region with neurogenic claudication L2-3 spondylolisthesis with foraminal stenosis, L3-4 degenerative disc disease and foraminal stenosis, solid fusion L4-S1   Past Medical History  Diagnosis Date  . Hypertension   . Asthma   . Cold     recent  rx  . GERD (gastroesophageal reflux disease)   . H/O hiatal hernia   . Arthritis   . Cancer     cervical , skin  . Shortness of breath   . Sleep apnea     ' mild " does not use cpap  . Fibromyalgia     Surgeries: Procedure(s): Extension of fusion for L4-S1 to L2-S1, Revision of L4-S1 Rod, Transforaminal lumbar interbody fusion Right L2-3 and L3-4 with rod and screw internal fixation 3 segments, local bone graft, Conform sheet with bone marrow aspirate on 04/07/2013   Consultants (if any):  none  Discharged Condition: Improved  Hospital Course: ORAH SONNEN is an 58 y.o. female who was admitted 04/07/2013 with a diagnosis of Spinal stenosis, lumbar region, with neurogenic claudication and went to the operating room on 04/07/2013 and underwent the above named procedures.    She was given perioperative antibiotics:  Anti-infectives   Start     Dose/Rate Route Frequency Ordered Stop   04/07/13 1730  ceFAZolin (ANCEF) IVPB 1 g/50 mL premix     1 g 100 mL/hr over 30 Minutes Intravenous Every 8 hours 04/07/13 1658 04/08/13 0139   04/07/13 0600  ceFAZolin (ANCEF) IVPB 2 g/50 mL premix     2 g 100  mL/hr over 30 Minutes Intravenous On call to O.R. 04/06/13 1406 04/07/13 1206    Pt developed severe post op nausea and was slow for recovery of bowel function.  Clear liquid diet, antiemetics, stool softeners and laxatives were used.  Eventually pt had return of bowel function and nausea resolved.  She was taking a regular diet at discharge.  Due to the significant nausea with analgesics, fentanyl patch was utilized with success of pain control.   Brace fitted and pt participated with PT and OT who felt pt was stable for discharge to home with her family. Pt was voiding well at discharge.  Low O2 sats required use of IS and improved.  IS was sent home with pt for use.  She was given sequential compression devices, early ambulation for DVT prophylaxis.  She benefited maximally from the hospital stay and there were no complications.    Recent vital signs:  Filed Vitals:   04/10/13 0500  BP: 119/59  Pulse: 104  Temp: 97.8 F (36.6 C)  Resp: 18    Recent laboratory studies:  Lab Results  Component Value Date   HGB 9.2* 04/10/2013   HGB 9.7* 04/09/2013   HGB 9.6* 04/08/2013   Lab Results  Component Value Date   WBC 13.6* 04/10/2013   PLT 256 04/10/2013   Lab Results  Component Value Date   INR 0.85 07/14/2010   Lab Results  Component Value Date   NA 134* 04/09/2013   K 3.6* 04/09/2013   CL 97 04/09/2013  CO2 26 04/09/2013   BUN 5* 04/09/2013   CREATININE 0.78 04/09/2013   GLUCOSE 157* 04/09/2013    Discharge Medications:     Medication List    STOP taking these medications       clonazePAM 0.5 MG tablet  Commonly known as:  KLONOPIN      TAKE these medications       diltiazem 240 MG 24 hr capsule  Commonly known as:  CARDIZEM CD  Take 240 mg by mouth daily.     diphenhydrAMINE 25 mg capsule  Commonly known as:  BENADRYL  Take 1 capsule (25 mg total) by mouth every 8 (eight) hours as needed for itching.     estradiol 0.5 MG tablet  Commonly known as:  ESTRACE  Take  0.5 mg by mouth daily.     fentaNYL 25 MCG/HR patch  Commonly known as:  DURAGESIC - dosed mcg/hr  Place 1 patch (25 mcg total) onto the skin every 3 (three) days.     fluticasone 110 MCG/ACT inhaler  Commonly known as:  FLOVENT HFA  Inhale 1 puff into the lungs as needed.     lidocaine 5 %  Commonly known as:  LIDODERM  Place 1 patch onto the skin daily. Remove & Discard patch within 12 hours or as directed by MD     loratadine 10 MG tablet  Commonly known as:  CLARITIN  Take 10 mg by mouth daily.     methocarbamol 500 MG tablet  Commonly known as:  ROBAXIN  Take 1 tablet (500 mg total) by mouth 3 (three) times daily.     ondansetron 4 MG tablet  Commonly known as:  ZOFRAN  Take 1 tablet (4 mg total) by mouth every 8 (eight) hours as needed for nausea or vomiting.     pantoprazole 40 MG tablet  Commonly known as:  PROTONIX  Take 40 mg by mouth 2 (two) times daily.     polyethylene glycol packet  Commonly known as:  MIRALAX / GLYCOLAX  Take 17 g by mouth daily.     senna-docusate 8.6-50 MG per tablet  Commonly known as:  Senokot-S  Take 1 tablet by mouth at bedtime.     traMADol 50 MG tablet  Commonly known as:  ULTRAM  Take 1 tablet (50 mg total) by mouth every 6 (six) hours as needed for moderate pain (For break through pain).        Diagnostic Studies: Dg Chest 2 View  04/01/2013   CLINICAL DATA:  Preoperative study prior to lumbar surgery  EXAM: CHEST  2 VIEW  COMPARISON:  PA and lateral chest x-ray dated September 15, 2012.  FINDINGS: The lungs are adequately inflated and clear. The cardiopericardial silhouette is normal in size. The pulmonary vascularity is not engorged. There is no pleural effusion. The observed portions of the bony thorax appear normal.  IMPRESSION: There is no evidence of acute cardiopulmonary abnormality. No significant chronic abnormality is demonstrated either.   Electronically Signed   By: David  Martinique   On: 04/01/2013 10:28   Dg Lumbar Spine  2-3 Views  04/07/2013   CLINICAL DATA:  Extension of lumbar fusion for L4-S1 and L2-S1 with revision of L4-S1 broad  EXAM: LUMBAR SPINE - 2-3 VIEW; DG C-ARM GT 120 MIN  COMPARISON:  Lumbar spine MRI - 02/13/2013; lumbar spine CT - 07/19/2010  FINDINGS: 4 spot intraoperative radiographic images of the lower lumbar spine are provided for review.  Lumbar spine labeling is in  keeping with preprocedural lumbar spine MRI and CT.  There has been cranial extension of previously noted bilateral L4-S1 paraspinal fusion and intervertebral disc space replacement, now extending on the right side to involve L3-L4 and on the left side, L2-L3 and L3-L4.  A horizontally oriented support bar is seen regional to the superior endplate of L4.  No definite evidence of hardware failure or loosening.  A radiopaque surgical instrument is seen overlying the left-side of the operative site on several of the provided radiographs, presumably external to the patient.  IMPRESSION: Cranial extension of previously noted paraspinal fusion hardware as above.   Electronically Signed   By: Sandi Mariscal M.D.   On: 04/07/2013 13:43   Dg Chest Port 1 View  04/09/2013   CLINICAL DATA:  Short of breath  EXAM: PORTABLE CHEST - 1 VIEW  COMPARISON:  04/01/2013  FINDINGS: Lungs are under aerated and grossly clear. Normal heart size. No pneumothorax. No pleural effusion. No acute bony deformity.  IMPRESSION: Low volumes.  No active cardiopulmonary disease.   Electronically Signed   By: Maryclare Bean M.D.   On: 04/09/2013 08:46   Dg C-arm Gt 120 Min  04/07/2013   CLINICAL DATA:  Extension of lumbar fusion for L4-S1 and L2-S1 with revision of L4-S1 broad  EXAM: LUMBAR SPINE - 2-3 VIEW; DG C-ARM GT 120 MIN  COMPARISON:  Lumbar spine MRI - 02/13/2013; lumbar spine CT - 07/19/2010  FINDINGS: 4 spot intraoperative radiographic images of the lower lumbar spine are provided for review.  Lumbar spine labeling is in keeping with preprocedural lumbar spine MRI and CT.   There has been cranial extension of previously noted bilateral L4-S1 paraspinal fusion and intervertebral disc space replacement, now extending on the right side to involve L3-L4 and on the left side, L2-L3 and L3-L4.  A horizontally oriented support bar is seen regional to the superior endplate of L4.  No definite evidence of hardware failure or loosening.  A radiopaque surgical instrument is seen overlying the left-side of the operative site on several of the provided radiographs, presumably external to the patient.  IMPRESSION: Cranial extension of previously noted paraspinal fusion hardware as above.   Electronically Signed   By: Sandi Mariscal M.D.   On: 04/07/2013 13:43    Disposition: 01-Home or Self Care      Discharge Orders   Future Orders Complete By Expires   Call MD / Call 911  As directed    Comments:     If you experience chest pain or shortness of breath, CALL 911 and be transported to the hospital emergency room.  If you develope a fever above 101 F, pus (white drainage) or increased drainage or redness at the wound, or calf pain, call your surgeon's office.   Constipation Prevention  As directed    Comments:     Drink plenty of fluids.  Prune juice may be helpful.  You may use a stool softener, such as Colace (over the counter) 100 mg twice a day.  Use MiraLax (over the counter) for constipation as needed.   Diet - low sodium heart healthy  As directed    Discharge instructions  As directed    Comments:     Call if there is increasing drainage, fever greater than 101.5, severe head aches, and worsening nausea or light sensitivity. If shortness of breath, bloody cough or chest tightness or pain go to an emergency room. No lifting greater than 10 lbs. Avoid bending, stooping and  twisting. Use brace when sitting and out of bed even to go to bathroom. Walk in house for first 2 weeks then may start to get out slowly increasing distances up to one mile by 4-6 weeks post op. After 5  days may shower and change dressing following bathing with shower.When bathing remove the brace shower and replace brace before getting out of the shower. If drainage, keep dry dressing and do not bathe the incision, use an moisture impervious dressing. Please call and return for scheduled follow up appointment 2 weeks from the time of surgery Use incentive spirometry every hour for at least 5 min while awake. Use your inhaler once daily to improve your lung function. Use the least amount of pain medications possible to relieve your pain as they decrease your breathing function. Hold the use of klonopin or your sleep medications if your pain medication is adequate.   Driving restrictions  As directed    Comments:     No driving for 6 weeks   Increase activity slowly as tolerated  As directed    Lifting restrictions  As directed    Comments:     No lifting for 6 weeks      Follow-up Information   Follow up with NITKA,JAMES E, MD In 2 weeks.   Specialty:  Orthopedic Surgery   Contact information:   [place] [address] [phone]        Signed: Epimenio Foot 04/20/2013, 4:10 PM

## 2013-04-21 NOTE — Discharge Summary (Signed)
Patient d/c summary note and lab reviewed.  

## 2014-03-31 DIAGNOSIS — M797 Fibromyalgia: Secondary | ICD-10-CM | POA: Diagnosis not present

## 2014-03-31 DIAGNOSIS — M47817 Spondylosis without myelopathy or radiculopathy, lumbosacral region: Secondary | ICD-10-CM | POA: Diagnosis not present

## 2014-03-31 DIAGNOSIS — M4808 Spinal stenosis, sacral and sacrococcygeal region: Secondary | ICD-10-CM | POA: Diagnosis not present

## 2014-04-02 DIAGNOSIS — K219 Gastro-esophageal reflux disease without esophagitis: Secondary | ICD-10-CM | POA: Diagnosis not present

## 2014-04-02 DIAGNOSIS — E782 Mixed hyperlipidemia: Secondary | ICD-10-CM | POA: Diagnosis not present

## 2014-04-02 DIAGNOSIS — M797 Fibromyalgia: Secondary | ICD-10-CM | POA: Diagnosis not present

## 2014-04-02 DIAGNOSIS — N3090 Cystitis, unspecified without hematuria: Secondary | ICD-10-CM | POA: Diagnosis not present

## 2014-04-02 DIAGNOSIS — M545 Low back pain: Secondary | ICD-10-CM | POA: Diagnosis not present

## 2014-04-02 DIAGNOSIS — Z0000 Encounter for general adult medical examination without abnormal findings: Secondary | ICD-10-CM | POA: Diagnosis not present

## 2014-04-09 DIAGNOSIS — I10 Essential (primary) hypertension: Secondary | ICD-10-CM | POA: Diagnosis not present

## 2014-04-09 DIAGNOSIS — K219 Gastro-esophageal reflux disease without esophagitis: Secondary | ICD-10-CM | POA: Diagnosis not present

## 2014-04-09 DIAGNOSIS — Z1389 Encounter for screening for other disorder: Secondary | ICD-10-CM | POA: Diagnosis not present

## 2014-04-09 DIAGNOSIS — J301 Allergic rhinitis due to pollen: Secondary | ICD-10-CM | POA: Diagnosis not present

## 2014-04-09 DIAGNOSIS — E782 Mixed hyperlipidemia: Secondary | ICD-10-CM | POA: Diagnosis not present

## 2014-04-09 DIAGNOSIS — J4522 Mild intermittent asthma with status asthmaticus: Secondary | ICD-10-CM | POA: Diagnosis not present

## 2014-04-09 DIAGNOSIS — M545 Low back pain: Secondary | ICD-10-CM | POA: Diagnosis not present

## 2014-04-15 DIAGNOSIS — Z1231 Encounter for screening mammogram for malignant neoplasm of breast: Secondary | ICD-10-CM | POA: Diagnosis not present

## 2014-04-26 ENCOUNTER — Encounter: Payer: Self-pay | Admitting: Obstetrics & Gynecology

## 2014-04-26 ENCOUNTER — Ambulatory Visit (INDEPENDENT_AMBULATORY_CARE_PROVIDER_SITE_OTHER): Payer: 59 | Admitting: Obstetrics & Gynecology

## 2014-04-26 VITALS — BP 126/83 | HR 70 | Resp 16 | Ht 68.0 in | Wt 154.0 lb

## 2014-04-26 DIAGNOSIS — N907 Vulvar cyst: Secondary | ICD-10-CM

## 2014-04-26 NOTE — Progress Notes (Signed)
Patient ID: [name], female   DOB: [birth year], 59 y.o.   MRN: [id number]  Chief Complaint  Patient presents with  . Rash    Pt states she noticed vaginal bumps that are painful and would like to have them checked    HPI [name] is a 59 y.o. female.  Above sx for 2 months.Occasional vaginal discharge. The lesions generally cause no discomfort.   HPI  Past Medical History  Diagnosis Date  . Hypertension   . Asthma   . Cold     recent  rx  . GERD (gastroesophageal reflux disease)   . H/O hiatal hernia   . Arthritis   . Cancer     cervical , skin  . Shortness of breath   . Sleep apnea     ' mild " does not use cpap  . Fibromyalgia     Past Surgical History  Procedure Laterality Date  . Cervical conization w/bx    . Appendectomy    . Abdominal hysterectomy  04  . Cholecystectomy  03  . Cervical disc surgery  06  . Back surgery      x3  . Tonsillectomy    . Lumbar fusion  04/07/2013    L 2 L3 L4    with rod     DR NITKA    History reviewed. No pertinent family history.  Social History History  Substance Use Topics  . Smoking status: Never Smoker   . Smokeless tobacco: Never Used  . Alcohol Use: No    Allergies  Allergen Reactions  . Asa [Aspirin] Nausea And Vomiting  . Codeine Hives  . Doxycycline Nausea And Vomiting  . Morphine And Related     High blood pressure   . Sulfa Antibiotics Hives and Nausea And Vomiting  . Tricyclic Antidepressants Nausea And Vomiting    Current Outpatient Prescriptions  Medication Sig Dispense Refill  . diltiazem (CARDIZEM CD) 240 MG 24 hr capsule Take 240 mg by mouth daily.    . diphenhydrAMINE (BENADRYL) 25 mg capsule Take 1 capsule (25 mg total) by mouth every 8 (eight) hours as needed for itching. 40 capsule 1  . estradiol (ESTRACE) 0.5 MG tablet Take 0.5 mg by mouth daily.    . fentaNYL (DURAGESIC - DOSED MCG/HR) 25 MCG/HR patch Place 1 patch (25 mcg total) onto the skin every 3 (three) days. 10 patch 0   . fluticasone (FLOVENT HFA) 110 MCG/ACT inhaler Inhale 1 puff into the lungs as needed.    . lidocaine (LIDODERM) 5 % Place 1 patch onto the skin daily. Remove & Discard patch within 12 hours or as directed by MD 30 patch 2  . loratadine (CLARITIN) 10 MG tablet Take 10 mg by mouth daily.    . methocarbamol (ROBAXIN) 500 MG tablet Take 1 tablet (500 mg total) by mouth 3 (three) times daily. 60 tablet 2  . ondansetron (ZOFRAN) 4 MG tablet Take 1 tablet (4 mg total) by mouth every 8 (eight) hours as needed for nausea or vomiting. 30 tablet 1  . pantoprazole (PROTONIX) 40 MG tablet Take 40 mg by mouth 2 (two) times daily.    . polyethylene glycol (MIRALAX / GLYCOLAX) packet Take 17 g by mouth daily. 14 each 1  . senna-docusate (SENOKOT-S) 8.6-50 MG per tablet Take 1 tablet by mouth at bedtime. 30 tablet 1  . traMADol (ULTRAM) 50 MG tablet Take 1 tablet (50 mg total) by mouth every 6 (six) hours as needed  for moderate pain (For break through pain). 50 tablet 1   No current facility-administered medications for this visit.    Review of Systems Review of Systems  Constitutional:       Body aches  Genitourinary: Positive for vaginal discharge. Negative for vaginal bleeding.  Musculoskeletal: Positive for myalgias and back pain.  Psychiatric/Behavioral: Positive for sleep disturbance and agitation.    Blood pressure 126/83, pulse 70, resp. rate 16, height 5\' 8"  (1.727 m), weight 154 lb (69.854 kg).  Physical Exam Physical Exam  Constitutional: She is oriented to person, place, and time. No distress.  Pulmonary/Chest: Effort normal. No respiratory distress.  Abdominal: Soft.  Genitourinary: No vaginal discharge found.  3 small inclusion cysts c/w sebaceous cysts on vulva, not inflamed or tender, vagina good support   Neurological: She is alert and oriented to person, place, and time.  Skin: Skin is warm and dry.  Psychiatric: She has a normal mood and affect. Her behavior is normal.     Data Reviewed Hx, meds and allergy  Assessment    Small sebaceous cysts vulva minimal sx. On HRT long term      Plan    Will try to wean estradiol. Report if vulvar sx worsen o/w no intervention recommended.        Mendoza,Sarah 04/26/2014, 10:59 AM

## 2014-04-26 NOTE — Patient Instructions (Signed)

## 2014-05-26 DIAGNOSIS — J019 Acute sinusitis, unspecified: Secondary | ICD-10-CM | POA: Diagnosis not present

## 2014-05-26 DIAGNOSIS — J069 Acute upper respiratory infection, unspecified: Secondary | ICD-10-CM | POA: Diagnosis not present

## 2014-06-02 DIAGNOSIS — M545 Low back pain: Secondary | ICD-10-CM | POA: Diagnosis not present

## 2014-06-04 ENCOUNTER — Other Ambulatory Visit: Payer: Self-pay | Admitting: Specialist

## 2014-06-04 DIAGNOSIS — M545 Low back pain: Secondary | ICD-10-CM

## 2014-06-05 ENCOUNTER — Ambulatory Visit
Admission: RE | Admit: 2014-06-05 | Discharge: 2014-06-05 | Disposition: A | Discharge status: Home / Self Care | Payer: Medicare Other | Source: Ambulatory Visit | Attending: Specialist | Admitting: Specialist

## 2014-06-05 DIAGNOSIS — M4316 Spondylolisthesis, lumbar region: Secondary | ICD-10-CM | POA: Diagnosis not present

## 2014-06-05 DIAGNOSIS — M545 Low back pain: Secondary | ICD-10-CM

## 2014-06-15 DIAGNOSIS — M47817 Spondylosis without myelopathy or radiculopathy, lumbosacral region: Secondary | ICD-10-CM | POA: Diagnosis not present

## 2014-06-15 DIAGNOSIS — M4808 Spinal stenosis, sacral and sacrococcygeal region: Secondary | ICD-10-CM | POA: Diagnosis not present

## 2014-07-07 DIAGNOSIS — E782 Mixed hyperlipidemia: Secondary | ICD-10-CM | POA: Diagnosis not present

## 2014-07-07 DIAGNOSIS — I10 Essential (primary) hypertension: Secondary | ICD-10-CM | POA: Diagnosis not present

## 2014-07-07 DIAGNOSIS — J301 Allergic rhinitis due to pollen: Secondary | ICD-10-CM | POA: Diagnosis not present

## 2014-07-07 DIAGNOSIS — M797 Fibromyalgia: Secondary | ICD-10-CM | POA: Diagnosis not present

## 2014-07-07 DIAGNOSIS — K219 Gastro-esophageal reflux disease without esophagitis: Secondary | ICD-10-CM | POA: Diagnosis not present

## 2014-07-15 DIAGNOSIS — R509 Fever, unspecified: Secondary | ICD-10-CM | POA: Diagnosis not present

## 2014-07-15 DIAGNOSIS — J209 Acute bronchitis, unspecified: Secondary | ICD-10-CM | POA: Diagnosis not present

## 2014-07-15 DIAGNOSIS — J111 Influenza due to unidentified influenza virus with other respiratory manifestations: Secondary | ICD-10-CM | POA: Diagnosis not present

## 2014-07-22 DIAGNOSIS — J209 Acute bronchitis, unspecified: Secondary | ICD-10-CM | POA: Diagnosis not present

## 2014-07-22 DIAGNOSIS — J019 Acute sinusitis, unspecified: Secondary | ICD-10-CM | POA: Diagnosis not present

## 2014-07-27 ENCOUNTER — Other Ambulatory Visit (HOSPITAL_COMMUNITY): Payer: Self-pay | Admitting: Specialist

## 2014-08-04 ENCOUNTER — Encounter (HOSPITAL_COMMUNITY)
Admission: RE | Admit: 2014-08-04 | Discharge: 2014-08-04 | Disposition: A | Discharge status: Home / Self Care | Payer: Medicare Other | Source: Ambulatory Visit | Attending: Specialist | Admitting: Specialist

## 2014-08-04 ENCOUNTER — Encounter (HOSPITAL_COMMUNITY): Payer: Self-pay

## 2014-08-04 DIAGNOSIS — Z01812 Encounter for preprocedural laboratory examination: Secondary | ICD-10-CM | POA: Insufficient documentation

## 2014-08-04 DIAGNOSIS — Z01810 Encounter for preprocedural cardiovascular examination: Secondary | ICD-10-CM | POA: Insufficient documentation

## 2014-08-04 DIAGNOSIS — I10 Essential (primary) hypertension: Secondary | ICD-10-CM | POA: Diagnosis not present

## 2014-08-04 HISTORY — DX: Other specified postprocedural states: Z98.890

## 2014-08-04 HISTORY — DX: Nausea with vomiting, unspecified: R11.2

## 2014-08-04 LAB — URINALYSIS, ROUTINE W REFLEX MICROSCOPIC
Bilirubin Urine: NEGATIVE
Glucose, UA: NEGATIVE mg/dL
Hgb urine dipstick: NEGATIVE
Ketones, ur: NEGATIVE mg/dL
LEUKOCYTES UA: NEGATIVE
NITRITE: NEGATIVE
PH: 5.5 (ref 5.0–8.0)
PROTEIN: NEGATIVE mg/dL
SPECIFIC GRAVITY, URINE: 1.01 (ref 1.005–1.030)
UROBILINOGEN UA: 0.2 mg/dL (ref 0.0–1.0)

## 2014-08-04 LAB — COMPREHENSIVE METABOLIC PANEL
ALT: 11 U/L — AB (ref 14–54)
AST: 14 U/L — ABNORMAL LOW (ref 15–41)
Albumin: 3.8 g/dL (ref 3.5–5.0)
Alkaline Phosphatase: 64 U/L (ref 38–126)
Anion gap: 10 (ref 5–15)
BILIRUBIN TOTAL: 0.4 mg/dL (ref 0.3–1.2)
BUN: 10 mg/dL (ref 6–20)
CHLORIDE: 105 mmol/L (ref 101–111)
CO2: 23 mmol/L (ref 22–32)
Calcium: 9.3 mg/dL (ref 8.9–10.3)
Creatinine, Ser: 1.02 mg/dL — ABNORMAL HIGH (ref 0.44–1.00)
GFR calc Af Amer: >60 mL/min (ref >60)
GFR calc non Af Amer: 59 mL/min — ABNORMAL LOW (ref >60)
GLUCOSE: 97 mg/dL (ref 70–99)
Potassium: 3.5 mmol/L (ref 3.5–5.1)
SODIUM: 138 mmol/L (ref 135–145)
TOTAL PROTEIN: 6.1 g/dL — AB (ref 6.5–8.1)

## 2014-08-04 LAB — TYPE AND SCREEN
ABO/RH(D): B POS
Antibody Screen: NEGATIVE

## 2014-08-04 LAB — SURGICAL PCR SCREEN
MRSA, PCR: NEGATIVE
STAPHYLOCOCCUS AUREUS: NEGATIVE

## 2014-08-04 LAB — CBC
HEMATOCRIT: 40.6 % (ref 36.0–46.0)
Hemoglobin: 13.8 g/dL (ref 12.0–15.0)
MCH: 28.6 pg (ref 26.0–34.0)
MCHC: 34 g/dL (ref 30.0–36.0)
MCV: 84.2 fL (ref 78.0–100.0)
Platelets: 294 10*3/uL (ref 150–400)
RBC: 4.82 MIL/uL (ref 3.87–5.11)
RDW: 13.5 % (ref 11.5–15.5)
WBC: 8.5 10*3/uL (ref 4.0–10.5)

## 2014-08-04 NOTE — Progress Notes (Addendum)
Anesthesia Chart Review:  Pt is 59 year old female scheduled for re-do transformaminal lumbar interbody fusion L2-3, extension of hardware to L1-2 with posterior fusion L1-2 with removal of L2 pedicle screw and rod on 08/09/2014 with Dr. Louanne Skye.   PCP is Dr. Kern Alberta in [place] ([facility]).   PMH includes: HTN, OSA (does not use CPAP), asthma, GERD, cervical cancer. Never smoker. BMI 23. S/p extension of fusion for L4-S1 to L2-S1, transforaminal lumbar interbody fusion right L2-3 and L3-4 on 04/07/13.   Reports history of severe N/V after 03/2013 surgery that lasted a month.   Medications include: diltiazem  Preoperative labs reviewed.    EKG: NSR. Cannot rule out Anterior infarct, age undetermined. Appears to be unchanged when compared with tracing dated 12/17/2012 (found in correspondence dated 04/11/2013 under media tab) and 07/14/2010.   Nuclear stress test on 12/30/12 [facility]) showed no evidence of inducible ischemia, normal LV systolic function and wall motion, EF 77%, non-diagnostic ECG portion due to baseline ECG abnormalities. Of note, the calculated TID ration is at the upper limits of normal. Visually, the LV cavity does not appear to dilate with vasodilator administration. (Report can be found in correspondence dated 04/11/2013 under media tab).   If no changes, I anticipate pt can proceed with surgery as scheduled.   Willeen Cass, FNP-BC [facility]/Anesthesiology Phone: [phone] 08/05/2014 12:49 PM

## 2014-08-04 NOTE — Progress Notes (Signed)
PCP: Dr. Kern Alberta in [place] ([facility] No cardiologist  Stress test couple yrs. Ago. States she had abnormal ekg at Dr. Olena Heckle office and he ordered stress test.States normal. Sleep study done several yrs. Ago, pt. Refuses to use cpap. Will request these studies.  Also states with last surgery her [facility] 03/2013 she had n/v after surgery that lasted one month. Stated she lost 20 lbs. Dr. Louanne Skye put her on a nausea med. That made her worst. Pt. Went to PCP and he put her on another medicine that worked. Pt. Will find out what medicine it was and let us know DOS.

## 2014-08-04 NOTE — Pre-Procedure Instructions (Signed)
[name]  08/04/2014   Your procedure is scheduled on:  Monday May 16  Report to [facility] Main Entrance "A" at 5:30AM.  Call this number if you have problems the morning of surgery: [phone]               Any questions prior to surgery call pre-admission [phone] (monday-Friday 8:00am-4:00pm)   Remember:   Do not eat food or drink liquids after midnight.   Take these medicines the morning of surgery with A SIP OF WATER: diltiazem, benadryl if needed, estradiol, flovent if needed (bring it to hospital), clariton if needed, protonix               Stop mobic and do not take any nonsteroidal anti-inflammatory: advil ,motrin, aleve or aspirin containing products   Do not wear jewelry, make-up or nail polish.  Do not wear lotions, powders, or perfumes. You may wear deodorant.  Do not shave 48 hours prior to surgery. Men may shave face and neck.  Do not bring valuables to the hospital.  [facility] is not responsible   for any belongings or valuables.               Contacts, dentures or bridgework may not be worn into surgery.  Leave suitcase in the car. After surgery it may be brought to your room.  For patients admitted to the hospital, discharge time is determined by your                treatment team.               Patients discharged the day of surgery will not be allowed to drive  home.  Name and phone number of your driver:   Special Instructions: review preparing for surgery handout   Please read over the following fact sheets that you were given: Pain Booklet, Coughing and Deep Breathing, Blood Transfusion Information, MRSA Information and Surgical Site Infection Prevention

## 2014-08-09 ENCOUNTER — Inpatient Hospital Stay (HOSPITAL_COMMUNITY): Payer: Medicare Other

## 2014-08-09 ENCOUNTER — Inpatient Hospital Stay (HOSPITAL_COMMUNITY): Payer: Medicare Other | Admitting: Anesthesiology

## 2014-08-09 ENCOUNTER — Inpatient Hospital Stay (HOSPITAL_COMMUNITY): Payer: Medicare Other | Admitting: Vascular Surgery

## 2014-08-09 ENCOUNTER — Inpatient Hospital Stay (HOSPITAL_COMMUNITY)
Admission: RE | Admit: 2014-08-09 | Discharge: 2014-08-13 | DRG: 460 | Disposition: A | Discharge status: Home Health | Payer: Medicare Other | Source: Ambulatory Visit | Attending: Specialist | Admitting: Specialist

## 2014-08-09 ENCOUNTER — Encounter (HOSPITAL_COMMUNITY)
Admission: RE | Disposition: A | Discharge status: Home Health | Payer: Medicare Other | Source: Ambulatory Visit | Attending: Specialist

## 2014-08-09 ENCOUNTER — Encounter (HOSPITAL_COMMUNITY): Payer: Self-pay | Admitting: *Deleted

## 2014-08-09 DIAGNOSIS — M5136 Other intervertebral disc degeneration, lumbar region: Secondary | ICD-10-CM | POA: Diagnosis present

## 2014-08-09 DIAGNOSIS — G9741 Accidental puncture or laceration of dura during a procedure: Secondary | ICD-10-CM | POA: Diagnosis not present

## 2014-08-09 DIAGNOSIS — M960 Pseudarthrosis after fusion or arthrodesis: Secondary | ICD-10-CM | POA: Diagnosis not present

## 2014-08-09 DIAGNOSIS — R339 Retention of urine, unspecified: Secondary | ICD-10-CM | POA: Diagnosis not present

## 2014-08-09 DIAGNOSIS — M797 Fibromyalgia: Secondary | ICD-10-CM | POA: Diagnosis present

## 2014-08-09 DIAGNOSIS — Z419 Encounter for procedure for purposes other than remedying health state, unspecified: Secondary | ICD-10-CM

## 2014-08-09 DIAGNOSIS — Y658 Other specified misadventures during surgical and medical care: Secondary | ICD-10-CM | POA: Diagnosis not present

## 2014-08-09 DIAGNOSIS — Z8541 Personal history of malignant neoplasm of cervix uteri: Secondary | ICD-10-CM | POA: Diagnosis not present

## 2014-08-09 DIAGNOSIS — R1110 Vomiting, unspecified: Secondary | ICD-10-CM

## 2014-08-09 DIAGNOSIS — M40205 Unspecified kyphosis, thoracolumbar region: Secondary | ICD-10-CM | POA: Diagnosis present

## 2014-08-09 DIAGNOSIS — Y92234 Operating room of hospital as the place of occurrence of the external cause: Secondary | ICD-10-CM

## 2014-08-09 DIAGNOSIS — M1990 Unspecified osteoarthritis, unspecified site: Secondary | ICD-10-CM | POA: Diagnosis not present

## 2014-08-09 DIAGNOSIS — Z85828 Personal history of other malignant neoplasm of skin: Secondary | ICD-10-CM | POA: Diagnosis not present

## 2014-08-09 DIAGNOSIS — M4316 Spondylolisthesis, lumbar region: Secondary | ICD-10-CM | POA: Diagnosis not present

## 2014-08-09 DIAGNOSIS — I10 Essential (primary) hypertension: Secondary | ICD-10-CM | POA: Diagnosis present

## 2014-08-09 DIAGNOSIS — K567 Ileus, unspecified: Secondary | ICD-10-CM | POA: Diagnosis not present

## 2014-08-09 DIAGNOSIS — G4730 Sleep apnea, unspecified: Secondary | ICD-10-CM | POA: Diagnosis not present

## 2014-08-09 DIAGNOSIS — M4808 Spinal stenosis, sacral and sacrococcygeal region: Secondary | ICD-10-CM | POA: Diagnosis not present

## 2014-08-09 DIAGNOSIS — M47817 Spondylosis without myelopathy or radiculopathy, lumbosacral region: Secondary | ICD-10-CM | POA: Diagnosis not present

## 2014-08-09 DIAGNOSIS — S32038K Other fracture of third lumbar vertebra, subsequent encounter for fracture with nonunion: Secondary | ICD-10-CM | POA: Diagnosis not present

## 2014-08-09 DIAGNOSIS — M5126 Other intervertebral disc displacement, lumbar region: Secondary | ICD-10-CM | POA: Diagnosis present

## 2014-08-09 DIAGNOSIS — J9811 Atelectasis: Secondary | ICD-10-CM | POA: Diagnosis not present

## 2014-08-09 DIAGNOSIS — K6389 Other specified diseases of intestine: Secondary | ICD-10-CM | POA: Diagnosis not present

## 2014-08-09 DIAGNOSIS — T8484XA Pain due to internal orthopedic prosthetic devices, implants and grafts, initial encounter: Secondary | ICD-10-CM | POA: Diagnosis present

## 2014-08-09 DIAGNOSIS — S32028K Other fracture of second lumbar vertebra, subsequent encounter for fracture with nonunion: Secondary | ICD-10-CM | POA: Diagnosis not present

## 2014-08-09 DIAGNOSIS — K219 Gastro-esophageal reflux disease without esophagitis: Secondary | ICD-10-CM | POA: Diagnosis not present

## 2014-08-09 DIAGNOSIS — T84039A Mechanical loosening of unspecified internal prosthetic joint, initial encounter: Secondary | ICD-10-CM | POA: Diagnosis not present

## 2014-08-09 DIAGNOSIS — K9189 Other postprocedural complications and disorders of digestive system: Secondary | ICD-10-CM

## 2014-08-09 DIAGNOSIS — M4326 Fusion of spine, lumbar region: Secondary | ICD-10-CM | POA: Diagnosis not present

## 2014-08-09 SURGERY — POSTERIOR LUMBAR FUSION 1 WITH HARDWARE REMOVAL
Anesthesia: General | Site: Back

## 2014-08-09 MED ORDER — DEXAMETHASONE SODIUM PHOSPHATE 10 MG/ML IJ SOLN
INTRAMUSCULAR | Status: AC
  Filled 2014-08-09: qty 1

## 2014-08-09 MED ORDER — FLUTICASONE PROPIONATE HFA 110 MCG/ACT IN AERO: 1.0000 | INHALATION_SPRAY | RESPIRATORY_TRACT | Status: DC | PRN

## 2014-08-09 MED ORDER — CHLORHEXIDINE GLUCONATE 4 % EX LIQD
60.0000 mL | Freq: Once | CUTANEOUS | Status: DC
  Filled 2014-08-09: qty 60

## 2014-08-09 MED ORDER — SODIUM CHLORIDE 0.9 % IV SOLN: 250.0000 mL | INTRAVENOUS | Status: DC

## 2014-08-09 MED ORDER — LIDOCAINE HCL (CARDIAC) 20 MG/ML IV SOLN
INTRAVENOUS | Status: DC | PRN
  Administered 2014-08-09: 50 mg via INTRAVENOUS

## 2014-08-09 MED ORDER — PHENYLEPHRINE 40 MCG/ML (10ML) SYRINGE FOR IV PUSH (FOR BLOOD PRESSURE SUPPORT)
PREFILLED_SYRINGE | INTRAVENOUS | Status: AC
  Filled 2014-08-09: qty 10

## 2014-08-09 MED ORDER — PROMETHAZINE HCL 25 MG/ML IJ SOLN: 6.2500 mg | INTRAMUSCULAR | Status: DC | PRN

## 2014-08-09 MED ORDER — SODIUM CHLORIDE 0.9 % IJ SOLN: 3.0000 mL | INTRAMUSCULAR | Status: DC | PRN

## 2014-08-09 MED ORDER — SODIUM CHLORIDE 0.9 % IJ SOLN
3.0000 mL | Freq: Two times a day (BID) | INTRAMUSCULAR | Status: DC
  Administered 2014-08-10 – 2014-08-12 (×3): 3 mL via INTRAVENOUS

## 2014-08-09 MED ORDER — LORATADINE 10 MG PO TABS
10.0000 mg | ORAL_TABLET | Freq: Every day | ORAL | Status: DC | PRN
  Administered 2014-08-11 – 2014-08-13 (×2): 10 mg via ORAL
  Filled 2014-08-09 (×2): qty 1

## 2014-08-09 MED ORDER — MENTHOL 3 MG MT LOZG: 1.0000 | LOZENGE | OROMUCOSAL | Status: DC | PRN

## 2014-08-09 MED ORDER — THROMBIN 20000 UNITS EX SOLR
CUTANEOUS | Status: AC
  Filled 2014-08-09: qty 20000

## 2014-08-09 MED ORDER — CEFAZOLIN SODIUM 1-5 GM-% IV SOLN
1.0000 g | Freq: Three times a day (TID) | INTRAVENOUS | Status: AC
  Administered 2014-08-09 – 2014-08-10 (×2): 1 g via INTRAVENOUS
  Filled 2014-08-09 (×2): qty 50

## 2014-08-09 MED ORDER — EPHEDRINE SULFATE 50 MG/ML IJ SOLN
INTRAMUSCULAR | Status: AC
  Filled 2014-08-09: qty 1

## 2014-08-09 MED ORDER — FENTANYL CITRATE (PF) 100 MCG/2ML IJ SOLN
INTRAMUSCULAR | Status: DC | PRN
  Administered 2014-08-09 (×2): 100 ug via INTRAVENOUS
  Administered 2014-08-09: 150 ug via INTRAVENOUS
  Administered 2014-08-09 (×2): 50 ug via INTRAVENOUS
  Administered 2014-08-09 (×2): 100 ug via INTRAVENOUS
  Administered 2014-08-09: 50 ug via INTRAVENOUS

## 2014-08-09 MED ORDER — ACETAMINOPHEN 650 MG RE SUPP: 650.0000 mg | RECTAL | Status: DC | PRN

## 2014-08-09 MED ORDER — PROPOFOL 10 MG/ML IV BOLUS
INTRAVENOUS | Status: AC
  Filled 2014-08-09: qty 20

## 2014-08-09 MED ORDER — FLEET ENEMA 7-19 GM/118ML RE ENEM: 1.0000 | ENEMA | Freq: Once | RECTAL | Status: AC | PRN

## 2014-08-09 MED ORDER — ESTRADIOL 1 MG PO TABS
0.5000 mg | ORAL_TABLET | Freq: Every day | ORAL | Status: DC
  Administered 2014-08-10 – 2014-08-12 (×3): 0.5 mg via ORAL
  Filled 2014-08-09 (×5): qty 0.5

## 2014-08-09 MED ORDER — CEFAZOLIN SODIUM-DEXTROSE 2-3 GM-% IV SOLR
2.0000 g | INTRAVENOUS | Status: AC
  Administered 2014-08-09 (×2): 2 g via INTRAVENOUS
  Filled 2014-08-09: qty 50

## 2014-08-09 MED ORDER — GLYCOPYRROLATE 0.2 MG/ML IJ SOLN
INTRAMUSCULAR | Status: DC | PRN
  Administered 2014-08-09: 0.4 mg via INTRAVENOUS

## 2014-08-09 MED ORDER — 0.9 % SODIUM CHLORIDE (POUR BTL) OPTIME
TOPICAL | Status: DC | PRN
  Administered 2014-08-09 (×2): 1000 mL

## 2014-08-09 MED ORDER — OXYCODONE HCL 5 MG PO TABS
ORAL_TABLET | ORAL | Status: AC
  Filled 2014-08-09: qty 1

## 2014-08-09 MED ORDER — ACETAMINOPHEN 10 MG/ML IV SOLN
INTRAVENOUS | Status: DC | PRN
  Administered 2014-08-09: 1000 mg via INTRAVENOUS

## 2014-08-09 MED ORDER — DIPHENHYDRAMINE HCL 50 MG/ML IJ SOLN
INTRAMUSCULAR | Status: DC | PRN
  Administered 2014-08-09: 25 mg via INTRAVENOUS

## 2014-08-09 MED ORDER — ONDANSETRON HCL 4 MG/2ML IJ SOLN
INTRAMUSCULAR | Status: AC
  Filled 2014-08-09: qty 2

## 2014-08-09 MED ORDER — SENNOSIDES-DOCUSATE SODIUM 8.6-50 MG PO TABS
1.0000 | ORAL_TABLET | Freq: Every day | ORAL | Status: DC
  Administered 2014-08-09 – 2014-08-12 (×4): 1 via ORAL
  Filled 2014-08-09 (×4): qty 1

## 2014-08-09 MED ORDER — OXYCODONE HCL 5 MG PO TABS
5.0000 mg | ORAL_TABLET | Freq: Once | ORAL | Status: AC | PRN
  Administered 2014-08-09: 5 mg via ORAL

## 2014-08-09 MED ORDER — PROPOFOL INFUSION 10 MG/ML OPTIME
INTRAVENOUS | Status: DC | PRN
  Administered 2014-08-09: 25 ug/kg/min via INTRAVENOUS

## 2014-08-09 MED ORDER — FENTANYL 25 MCG/HR TD PT72: 25.0000 ug | MEDICATED_PATCH | TRANSDERMAL | Status: DC

## 2014-08-09 MED ORDER — SODIUM CHLORIDE 0.9 % IJ SOLN
INTRAMUSCULAR | Status: AC
  Filled 2014-08-09: qty 10

## 2014-08-09 MED ORDER — OXYCODONE HCL 5 MG/5ML PO SOLN: 5.0000 mg | Freq: Once | ORAL | Status: AC | PRN

## 2014-08-09 MED ORDER — DILTIAZEM HCL ER COATED BEADS 240 MG PO CP24
240.0000 mg | ORAL_CAPSULE | Freq: Every day | ORAL | Status: DC
  Administered 2014-08-10 – 2014-08-12 (×3): 240 mg via ORAL
  Filled 2014-08-09 (×5): qty 1

## 2014-08-09 MED ORDER — PANTOPRAZOLE SODIUM 40 MG PO TBEC
40.0000 mg | DELAYED_RELEASE_TABLET | Freq: Two times a day (BID) | ORAL | Status: DC
  Administered 2014-08-09 – 2014-08-12 (×7): 40 mg via ORAL
  Filled 2014-08-09 (×7): qty 1

## 2014-08-09 MED ORDER — BUPIVACAINE HCL 0.5 % IJ SOLN
INTRAMUSCULAR | Status: DC | PRN
Start: 2014-08-09 — End: 2014-08-09
  Administered 2014-08-09: 30 mL

## 2014-08-09 MED ORDER — NALBUPHINE HCL 10 MG/ML IJ SOLN
10.0000 mg | INTRAMUSCULAR | Status: DC | PRN
  Filled 2014-08-09: qty 1

## 2014-08-09 MED ORDER — NEOSTIGMINE METHYLSULFATE 10 MG/10ML IV SOLN
INTRAVENOUS | Status: AC
  Filled 2014-08-09: qty 1

## 2014-08-09 MED ORDER — DIPHENHYDRAMINE HCL 50 MG/ML IJ SOLN
25.0000 mg | Freq: Four times a day (QID) | INTRAMUSCULAR | Status: DC | PRN
  Administered 2014-08-09 – 2014-08-10 (×3): 25 mg via INTRAVENOUS
  Filled 2014-08-09 (×3): qty 1

## 2014-08-09 MED ORDER — PHENYLEPHRINE HCL 10 MG/ML IJ SOLN
10.0000 mg | INTRAVENOUS | Status: DC | PRN
  Administered 2014-08-09: 25 ug/min via INTRAVENOUS

## 2014-08-09 MED ORDER — SCOPOLAMINE 1 MG/3DAYS TD PT72
MEDICATED_PATCH | TRANSDERMAL | Status: AC
Start: 2014-08-09 — End: 2014-08-09
  Administered 2014-08-09: 1.5 mg via TRANSDERMAL
  Filled 2014-08-09: qty 1

## 2014-08-09 MED ORDER — ACETAMINOPHEN 325 MG PO TABS: 650.0000 mg | ORAL_TABLET | ORAL | Status: DC | PRN

## 2014-08-09 MED ORDER — THROMBIN 20000 UNITS EX KIT
PACK | CUTANEOUS | Status: DC | PRN
  Administered 2014-08-09: 20000 [IU] via TOPICAL

## 2014-08-09 MED ORDER — FENTANYL CITRATE (PF) 250 MCG/5ML IJ SOLN
INTRAMUSCULAR | Status: AC
  Filled 2014-08-09: qty 5

## 2014-08-09 MED ORDER — CLONAZEPAM 0.5 MG PO TABS
0.5000 mg | ORAL_TABLET | Freq: Every day | ORAL | Status: DC
  Administered 2014-08-09 – 2014-08-12 (×4): 0.5 mg via ORAL
  Filled 2014-08-09 (×4): qty 1

## 2014-08-09 MED ORDER — LACTATED RINGERS IV SOLN
INTRAVENOUS | Status: DC | PRN
  Administered 2014-08-09: 1000 mL via INTRAVENOUS

## 2014-08-09 MED ORDER — METHOCARBAMOL 500 MG PO TABS: 500.0000 mg | ORAL_TABLET | Freq: Three times a day (TID) | ORAL | Status: DC

## 2014-08-09 MED ORDER — DEXAMETHASONE SODIUM PHOSPHATE 10 MG/ML IJ SOLN
INTRAMUSCULAR | Status: DC | PRN
  Administered 2014-08-09: 10 mg via INTRAVENOUS

## 2014-08-09 MED ORDER — DIAZEPAM 5 MG PO TABS
5.0000 mg | ORAL_TABLET | Freq: Four times a day (QID) | ORAL | Status: DC | PRN
  Administered 2014-08-10: 5 mg via ORAL
  Filled 2014-08-09: qty 1

## 2014-08-09 MED ORDER — POLYETHYLENE GLYCOL 3350 17 G PO PACK: 17.0000 g | PACK | Freq: Every day | ORAL | Status: DC

## 2014-08-09 MED ORDER — ZOLPIDEM TARTRATE 5 MG PO TABS: 5.0000 mg | ORAL_TABLET | Freq: Every evening | ORAL | Status: DC | PRN

## 2014-08-09 MED ORDER — ROCURONIUM BROMIDE 100 MG/10ML IV SOLN
INTRAVENOUS | Status: DC | PRN
  Administered 2014-08-09: 40 mg via INTRAVENOUS
  Administered 2014-08-09 (×2): 30 mg via INTRAVENOUS

## 2014-08-09 MED ORDER — ONDANSETRON HCL 4 MG/2ML IJ SOLN
INTRAMUSCULAR | Status: DC | PRN
  Administered 2014-08-09: 4 mg via INTRAVENOUS

## 2014-08-09 MED ORDER — ONDANSETRON HCL 4 MG/2ML IJ SOLN
4.0000 mg | INTRAMUSCULAR | Status: DC | PRN
  Administered 2014-08-10 – 2014-08-12 (×5): 4 mg via INTRAVENOUS
  Filled 2014-08-09 (×5): qty 2

## 2014-08-09 MED ORDER — FENTANYL CITRATE (PF) 250 MCG/5ML IJ SOLN
INTRAMUSCULAR | Status: AC
Start: 2014-08-09 — End: 2014-08-09
  Filled 2014-08-09: qty 5

## 2014-08-09 MED ORDER — PANTOPRAZOLE SODIUM 40 MG IV SOLR: 40.0000 mg | Freq: Every day | INTRAVENOUS | Status: DC

## 2014-08-09 MED ORDER — GLYCOPYRROLATE 0.2 MG/ML IJ SOLN
INTRAMUSCULAR | Status: AC
  Filled 2014-08-09: qty 2

## 2014-08-09 MED ORDER — SODIUM CHLORIDE 0.9 % IV SOLN
INTRAVENOUS | Status: DC | PRN
  Administered 2014-08-09: 12:00:00 via INTRAVENOUS

## 2014-08-09 MED ORDER — METHOCARBAMOL 500 MG PO TABS
500.0000 mg | ORAL_TABLET | Freq: Three times a day (TID) | ORAL | Status: DC | PRN
  Administered 2014-08-09 – 2014-08-10 (×3): 500 mg via ORAL
  Filled 2014-08-09 (×3): qty 1

## 2014-08-09 MED ORDER — DIPHENHYDRAMINE HCL 50 MG/ML IJ SOLN
INTRAMUSCULAR | Status: AC
  Filled 2014-08-09: qty 1

## 2014-08-09 MED ORDER — ROCURONIUM BROMIDE 50 MG/5ML IV SOLN
INTRAVENOUS | Status: AC
  Filled 2014-08-09: qty 1

## 2014-08-09 MED ORDER — PHENOL 1.4 % MT LIQD: 1.0000 | OROMUCOSAL | Status: DC | PRN

## 2014-08-09 MED ORDER — SUCCINYLCHOLINE CHLORIDE 20 MG/ML IJ SOLN
INTRAMUSCULAR | Status: AC
  Filled 2014-08-09: qty 1

## 2014-08-09 MED ORDER — CEFAZOLIN SODIUM-DEXTROSE 2-3 GM-% IV SOLR
INTRAVENOUS | Status: AC
  Filled 2014-08-09: qty 50

## 2014-08-09 MED ORDER — ALUM & MAG HYDROXIDE-SIMETH 200-200-20 MG/5ML PO SUSP
30.0000 mL | Freq: Four times a day (QID) | ORAL | Status: DC | PRN
  Administered 2014-08-10 – 2014-08-12 (×4): 30 mL via ORAL
  Filled 2014-08-09 (×4): qty 30

## 2014-08-09 MED ORDER — HEMOSTATIC AGENTS (NO CHARGE) OPTIME
TOPICAL | Status: DC | PRN
  Administered 2014-08-09 (×2): 1 via TOPICAL

## 2014-08-09 MED ORDER — LIDOCAINE HCL (CARDIAC) 20 MG/ML IV SOLN
INTRAVENOUS | Status: AC
  Filled 2014-08-09: qty 5

## 2014-08-09 MED ORDER — NEOSTIGMINE METHYLSULFATE 10 MG/10ML IV SOLN
INTRAVENOUS | Status: DC | PRN
  Administered 2014-08-09: 3 mg via INTRAVENOUS

## 2014-08-09 MED ORDER — BUPIVACAINE LIPOSOME 1.3 % IJ SUSP
20.0000 mL | INTRAMUSCULAR | Status: AC
  Administered 2014-08-09: 20 mL
  Filled 2014-08-09: qty 20

## 2014-08-09 MED ORDER — HYDROMORPHONE HCL 1 MG/ML IJ SOLN: 0.2500 mg | INTRAMUSCULAR | Status: DC | PRN

## 2014-08-09 MED ORDER — POTASSIUM CHLORIDE IN NACL 20-0.9 MEQ/L-% IV SOLN
INTRAVENOUS | Status: DC
  Administered 2014-08-09 – 2014-08-11 (×5): via INTRAVENOUS
  Filled 2014-08-09 (×9): qty 1000

## 2014-08-09 MED ORDER — PHENYLEPHRINE HCL 10 MG/ML IJ SOLN
INTRAMUSCULAR | Status: DC | PRN
  Administered 2014-08-09 (×4): 80 ug via INTRAVENOUS

## 2014-08-09 MED ORDER — ROCURONIUM BROMIDE 50 MG/5ML IV SOLN
INTRAVENOUS | Status: AC
  Filled 2014-08-09: qty 2

## 2014-08-09 MED ORDER — PROPOFOL 10 MG/ML IV BOLUS
INTRAVENOUS | Status: DC | PRN
  Administered 2014-08-09: 150 mg via INTRAVENOUS

## 2014-08-09 MED ORDER — LACTATED RINGERS IV SOLN
INTRAVENOUS | Status: DC | PRN
  Administered 2014-08-09 (×3): via INTRAVENOUS

## 2014-08-09 MED ORDER — MIDAZOLAM HCL 2 MG/2ML IJ SOLN
INTRAMUSCULAR | Status: AC
  Filled 2014-08-09: qty 2

## 2014-08-09 MED ORDER — ACETAMINOPHEN 10 MG/ML IV SOLN
INTRAVENOUS | Status: AC
  Filled 2014-08-09: qty 100

## 2014-08-09 MED ORDER — MIDAZOLAM HCL 5 MG/5ML IJ SOLN
INTRAMUSCULAR | Status: DC | PRN
  Administered 2014-08-09: 2 mg via INTRAVENOUS

## 2014-08-09 MED ORDER — TRAMADOL HCL 50 MG PO TABS
50.0000 mg | ORAL_TABLET | Freq: Four times a day (QID) | ORAL | Status: DC | PRN
  Administered 2014-08-09 – 2014-08-10 (×4): 50 mg via ORAL
  Filled 2014-08-09 (×4): qty 1

## 2014-08-09 MED ORDER — DIPHENHYDRAMINE HCL 25 MG PO CAPS: 25.0000 mg | ORAL_CAPSULE | Freq: Three times a day (TID) | ORAL | Status: DC | PRN

## 2014-08-09 MED FILL — Heparin Sodium (Porcine) Inj 1000 Unit/ML: INTRAMUSCULAR | Qty: 30 | Status: AC

## 2014-08-09 MED FILL — Sodium Chloride Irrigation Soln 0.9%: Qty: 3000 | Status: AC

## 2014-08-09 MED FILL — Sodium Chloride IV Soln 0.9%: INTRAVENOUS | Qty: 1000 | Status: AC

## 2014-08-09 SURGICAL SUPPLY — 93 items
ADH SKN CLS APL DERMABOND .7 (GAUZE/BANDAGES/DRESSINGS) ×1
APL SRG 60D 8 XTD TIP BNDBL (TIP) ×1
BONE CANC CHIPS 20CC PCAN1/4 (Bone Implant) ×3 IMPLANT
BONE MATRIX VIVIGEN 1CC (Bone Implant) ×2 IMPLANT
BONE MATRIX VIVIGEN 5CC (Bone Implant) ×3 IMPLANT
BUR MATCHSTICK NEURO 3.0 LAGG (BURR) ×3 IMPLANT
BUR RND FLUTED 2.5 (BURR) IMPLANT
BUR SABER RD CUTTING 3.0 (BURR) IMPLANT
BUR SABER RD CUTTING 3.0MM (BURR)
CAGE BULLET CONCORDE 9X10X27 (Cage) ×1 IMPLANT
CAGE BULLET CONCORDE 9X10X27MM (Cage) ×1 IMPLANT
CHIPS CANC BONE 20CC PCAN1/4 (Bone Implant) ×1 IMPLANT
CLOSURE STERI-STRIP 1/2X4 (GAUZE/BANDAGES/DRESSINGS) ×2
CLSR STERI-STRIP ANTIMIC 1/2X4 (GAUZE/BANDAGES/DRESSINGS) ×2 IMPLANT
CONNECTOR EXPEDIUM TI 55MM (Connector) ×4 IMPLANT
COVER MAYO STAND STRL (DRAPES) ×6 IMPLANT
COVER SURGICAL LIGHT HANDLE (MISCELLANEOUS) ×3 IMPLANT
DERMABOND ADVANCED (GAUZE/BANDAGES/DRESSINGS) ×2
DERMABOND ADVANCED .7 DNX12 (GAUZE/BANDAGES/DRESSINGS) ×1 IMPLANT
DRAPE C-ARM 42X72 X-RAY (DRAPES) ×6 IMPLANT
DRAPE MICROSCOPE LEICA (MISCELLANEOUS) ×3 IMPLANT
DRAPE SURG 17X23 STRL (DRAPES) ×9 IMPLANT
DRAPE TABLE COVER HEAVY DUTY (DRAPES) ×3 IMPLANT
DRSG MEPILEX BORDER 4X4 (GAUZE/BANDAGES/DRESSINGS) IMPLANT
DRSG MEPILEX BORDER 4X8 (GAUZE/BANDAGES/DRESSINGS) ×2 IMPLANT
DRSG PAD ABDOMINAL 8X10 ST (GAUZE/BANDAGES/DRESSINGS) ×3 IMPLANT
DURAPREP 26ML APPLICATOR (WOUND CARE) ×3 IMPLANT
DURASEAL APPLICATOR TIP (TIP) ×2 IMPLANT
DURASEAL SPINE SEALANT 3ML (MISCELLANEOUS) ×2 IMPLANT
ELECT BLADE 4.0 EZ CLEAN MEGAD (MISCELLANEOUS) ×3
ELECT BLADE 6.5 EXT (BLADE) IMPLANT
ELECT CAUTERY BLADE 6.4 (BLADE) ×3 IMPLANT
ELECT REM PT RETURN 9FT ADLT (ELECTROSURGICAL) ×3
ELECTRODE BLDE 4.0 EZ CLN MEGD (MISCELLANEOUS) ×1 IMPLANT
ELECTRODE REM PT RTRN 9FT ADLT (ELECTROSURGICAL) ×1 IMPLANT
EVACUATOR 1/8 PVC DRAIN (DRAIN) IMPLANT
GAUZE SPONGE 4X4 12PLY STRL (GAUZE/BANDAGES/DRESSINGS) ×3 IMPLANT
GLOVE BIO SURGEON STRL SZ 6.5 (GLOVE) ×1 IMPLANT
GLOVE BIO SURGEONS STRL SZ 6.5 (GLOVE) ×1
GLOVE BIOGEL PI IND STRL 7.0 (GLOVE) IMPLANT
GLOVE BIOGEL PI IND STRL 7.5 (GLOVE) IMPLANT
GLOVE BIOGEL PI IND STRL 8 (GLOVE) ×1 IMPLANT
GLOVE BIOGEL PI INDICATOR 7.0 (GLOVE) ×4
GLOVE BIOGEL PI INDICATOR 7.5 (GLOVE) ×2
GLOVE BIOGEL PI INDICATOR 8 (GLOVE) ×2
GLOVE ECLIPSE 6.5 STRL STRAW (GLOVE) ×12 IMPLANT
GLOVE ECLIPSE 8.5 STRL (GLOVE) ×3 IMPLANT
GLOVE ECLIPSE 9.0 STRL (GLOVE) ×3 IMPLANT
GLOVE ORTHO TXT STRL SZ7.5 (GLOVE) ×3 IMPLANT
GLOVE SURG 8.5 LATEX PF (GLOVE) ×5 IMPLANT
GLOVE SURG SS PI 6.5 STRL IVOR (GLOVE) ×2 IMPLANT
GLOVE SURG SS PI 8.0 STRL IVOR (GLOVE) ×2 IMPLANT
GOWN STRL REUS W/ TWL LRG LVL3 (GOWN DISPOSABLE) ×1 IMPLANT
GOWN STRL REUS W/TWL 2XL LVL3 (GOWN DISPOSABLE) ×9 IMPLANT
GOWN STRL REUS W/TWL LRG LVL3 (GOWN DISPOSABLE) ×9
GRAFT BNE CANC CHIPS 1-8 20CC (Bone Implant) IMPLANT
HEMOSTAT SURGICEL 2X14 (HEMOSTASIS) IMPLANT
HOOK EXPEDIUM 5.5 10 REGULAR (Hook) ×6 IMPLANT
HOOK EXPEDIUM 5.5 10MM ANGLED (Hook) ×2 IMPLANT
HOOK EXPEDIUM 5.5X12 (Hook) ×6 IMPLANT
KIT BASIN OR (CUSTOM PROCEDURE TRAY) ×3 IMPLANT
KIT POSITION SURG JACKSON T1 (MISCELLANEOUS) ×3 IMPLANT
KIT ROOM TURNOVER OR (KITS) ×3 IMPLANT
NDL ASP BONE MRW 11GX15 (NEEDLE) IMPLANT
NEEDLE 22X1 1/2 (OR ONLY) (NEEDLE) ×5 IMPLANT
NEEDLE ASP BONE MRW 11GX15 (NEEDLE) IMPLANT
NEEDLE BONE MARROW 8GAX6 (NEEDLE) IMPLANT
NS IRRIG 1000ML POUR BTL (IV SOLUTION) ×3 IMPLANT
PACK LAMINECTOMY ORTHO (CUSTOM PROCEDURE TRAY) ×3 IMPLANT
PACK TRANSFER 300ML (TOM) (MISCELLANEOUS) ×3 IMPLANT
PAD ARMBOARD 7.5X6 YLW CONV (MISCELLANEOUS) ×6 IMPLANT
PATTIES SURGICAL .75X.75 (GAUZE/BANDAGES/DRESSINGS) ×3 IMPLANT
ROD EXPEDIUM PREBENT 95MM (Rod) ×4 IMPLANT
ROD PREBENT EXPEDIUM 5.5 95MM (Rod) ×2 IMPLANT
SCREW POLY EXPEDIUM 6.35 5X40 (Screw) ×3 IMPLANT
SCREW SET SINGLE INNER (Screw) ×8 IMPLANT
SPONGE LAP 4X18 X RAY DECT (DISPOSABLE) IMPLANT
STYLET INTUB SATIN SLIP 14FR (MISCELLANEOUS) ×2 IMPLANT
SUT NURALON 4 0 TR CR/8 (SUTURE) ×2 IMPLANT
SUT VIC AB 0 CT1 27 (SUTURE) ×3
SUT VIC AB 0 CT1 27XBRD ANBCTR (SUTURE) ×1 IMPLANT
SUT VIC AB 1 CTX 36 (SUTURE) ×6
SUT VIC AB 1 CTX36XBRD ANBCTR (SUTURE) ×2 IMPLANT
SUT VIC AB 2-0 CT1 27 (SUTURE) ×6
SUT VIC AB 2-0 CT1 TAPERPNT 27 (SUTURE) ×1 IMPLANT
SUT VIC AB 3-0 X1 27 (SUTURE) ×5 IMPLANT
SYR 20CC LL (SYRINGE) ×3 IMPLANT
SYR CONTROL 10ML LL (SYRINGE) ×6 IMPLANT
TOWEL OR 17X24 6PK STRL BLUE (TOWEL DISPOSABLE) ×3 IMPLANT
TOWEL OR 17X26 10 PK STRL BLUE (TOWEL DISPOSABLE) ×3 IMPLANT
TRAY FOLEY CATH 16FRSI W/METER (SET/KITS/TRAYS/PACK) ×3 IMPLANT
WATER STERILE IRR 1000ML POUR (IV SOLUTION) ×3 IMPLANT
YANKAUER SUCT BULB TIP NO VENT (SUCTIONS) ×3 IMPLANT

## 2014-08-09 NOTE — Interval H&P Note (Signed)
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 After consideration of risks, benefits and other options for treatment, the patient has consented to  Procedure(s): Re-do transforaminal lumbar interbody fusion L2-3 with infuse BMP and iliac crest bone graft, Extension of hardware to L1-2 with posterior fusion L1-2 with removal of left L2 pedicle screw and rod (N/A) as a surgical intervention .  The patient's history has been reviewed, patient examined, no change in status, stable for surgery.  I have reviewed the patient's chart and labs.  Questions were answered to the patient's satisfaction.     NITKA,JAMES E

## 2014-08-09 NOTE — Anesthesia Procedure Notes (Signed)
Procedure Name: Intubation Date/Time: 08/09/2014 7:43 AM Performed by: Rebekah Chesterfield L Pre-anesthesia Checklist: Patient identified, Emergency Drugs available, Suction available, Patient being monitored and Timeout performed Patient Re-evaluated:Patient Re-evaluated prior to inductionOxygen Delivery Method: Circle system utilized Preoxygenation: Pre-oxygenation with 100% oxygen Intubation Type: IV induction Ventilation: Mask ventilation without difficulty Laryngoscope Size: Mac and 3 Grade View: Grade I Tube type: Oral Tube size: 7.5 mm Number of attempts: 1 Airway Equipment and Method: Stylet Placement Confirmation: ETT inserted through vocal cords under direct vision,  breath sounds checked- equal and bilateral and positive ETCO2 Secured at: 20 cm Tube secured with: Tape Dental Injury: Teeth and Oropharynx as per pre-operative assessment

## 2014-08-09 NOTE — Progress Notes (Signed)
Orthopedic Tech Progress Note Patient Details:  [name] 11-Mar-1956 [id number] [employer] called for brace order Patient ID: [name], female   DOB: 1955/08/31, 59 y.o.   MRN: [id number]   Fenton Foy 08/09/2014, 2:13 PM

## 2014-08-09 NOTE — Anesthesia Postprocedure Evaluation (Signed)
  Anesthesia Post-op Note  Patient: [name]  Procedure(s) Performed: Procedure(s): Re-do transforaminal lumbar interbody fusion L2-3 with  vivigen, cancellous allograft and local bone graft, Extension of hardware to L1-2 with posterior fusion L1-2 with removal of left L2 pedicle screw and rod. Insertion of  transforaminal lumbar interbody Concorde Bullett cage 55mm at L2-L3. Insertion of lumbar hooks L2-L3 on right, and insertion thoracic hooks L2-L3 on left. (N/A)  Patient Location: PACU  Anesthesia Type: General   Level of Consciousness: awake, alert  and oriented  Airway and Oxygen Therapy: Patient Spontanous Breathing  Post-op Pain: mild  Post-op Assessment: Post-op Vital signs reviewed  Post-op Vital Signs: Reviewed  Last Vitals:  Filed Vitals:   08/09/14 1931  BP: 129/87  Pulse: 98  Temp: 36.5 C  Resp:     Complications: No apparent anesthesia complications

## 2014-08-09 NOTE — Anesthesia Preprocedure Evaluation (Addendum)
Anesthesia Evaluation  Patient identified by MRN, date of birth, ID band Patient awake    Reviewed: Allergy & Precautions, NPO status , Patient's Chart, lab work & pertinent test results  History of Anesthesia Complications (+) PONV  Airway Mallampati: II  TM Distance: >3 FB Neck ROM: Full    Dental  (+) Edentulous Upper, Partial Lower   Pulmonary asthma , sleep apnea ,  breath sounds clear to auscultation        Cardiovascular hypertension, Pt. on medications Rhythm:Regular Rate:Normal     Neuro/Psych negative neurological ROS     GI/Hepatic Neg liver ROS, hiatal hernia, GERD-  ,  Endo/Other  negative endocrine ROS  Renal/GU negative Renal ROS     Musculoskeletal  (+) Arthritis -, Fibromyalgia -  Abdominal   Peds  Hematology negative hematology ROS (+)   Anesthesia Other Findings   Reproductive/Obstetrics                            Anesthesia Physical Anesthesia Plan  ASA: II  Anesthesia Plan: General   Post-op Pain Management:    Induction: Intravenous  Airway Management Planned: Oral ETT  Additional Equipment:   Intra-op Plan:   Post-operative Plan: Extubation in OR  Informed Consent: I have reviewed the patients History and Physical, chart, labs and discussed the procedure including the risks, benefits and alternatives for the proposed anesthesia with the patient or authorized representative who has indicated his/her understanding and acceptance.   Dental advisory given  Plan Discussed with: CRNA  Anesthesia Plan Comments:         Anesthesia Quick Evaluation

## 2014-08-09 NOTE — Brief Op Note (Signed)
08/09/2014  1:00 PM  PATIENT:  [name]  59 y.o. 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 Ola Spurr.  EBL:  Total I/O In: 7014 [I.V.:3100; Blood:120] Out: 1310 [Urine:960; Blood:350]  BLOOD ADMINISTERED:120 CC CELLSAVER  DRAINS: Urinary Catheter (Foley)   LOCAL MEDICATIONS USED:  MARCAINE 0.5% 1:1 EXPAREL 1.3% Amount:30 ml  SPECIMEN:  No Specimen  DISPOSITION OF SPECIMEN:  N/A   COMPLICATIONS: Left D0-3 dural tear, repaired with interrupted 4-O Neurolon and duraseal.  COUNTS:  YES  TOURNIQUET:  * No tourniquets in log *  DICTATION: .Dragon Dictation  PLAN OF CARE: Admit to inpatient   PATIENT DISPOSITION:  PACU - hemodynamically stable.   Delay start of Pharmacological VTE agent (>24hrs) due to surgical blood loss or risk of bleeding: yes

## 2014-08-09 NOTE — Transfer of Care (Signed)
Immediate Anesthesia Transfer of Care Note  Patient: [name]  Procedure(s) Performed: Procedure(s): Re-do transforaminal lumbar interbody fusion L2-3 with  vivigen, cancellous allograft and local bone graft, Extension of hardware to L1-2 with posterior fusion L1-2 with removal of left L2 pedicle screw and rod. Insertion of  transforaminal lumbar interbody Concorde Bullett cage 46mm at L2-L3. Insertion of lumbar hooks L2-L3 on right, and insertion thoracic hooks L2-L3 on left. (N/A)  Patient Location: PACU  Anesthesia Type:General  Level of Consciousness: sedated and responds to stimulation  Airway & Oxygen Therapy: Patient Spontanous Breathing and Patient connected to face mask oxygen  Post-op Assessment: Report given to RN, Post -op Vital signs reviewed and stable and Patient moving all extremities  Post vital signs: Reviewed and stable  Last Vitals:  Filed Vitals:   08/09/14 0623  BP: 139/98  Pulse: 65  Temp: 36.4 C  Resp: 20    Complications: No apparent anesthesia complications

## 2014-08-09 NOTE — Discharge Instructions (Signed)
° ° °  Call if there is increasing drainage, fever greater than 101.5, severe head aches, and °worsening nausea or light sensitivity. °If shortness of breath, bloody cough or chest tightness or pain go to an emergency room. °No lifting greater than 10 lbs. °Avoid bending, stooping and twisting. °Use brace when sitting and out of bed even to go to bathroom. °Walk in house for first 2 weeks then may start to get out slowly increasing distances up to one half mile by 4-6 weeks post op. °After 5 days may shower and change dressing following bathing with shower.When °bathing remove the brace shower and replace brace before getting out of the shower. °If drainage, keep dry dressing and do not bathe the incision, use an moisture impervious dressing. °Please call and return for scheduled follow up appointment 2 weeks from the time of surgery. ° ° °

## 2014-08-09 NOTE — Op Note (Signed)
08/09/2014  8:30 PM  PATIENT:  [name]  59 y.o. 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 Insertion of  transforaminal lumbar interbody Concorde Bullett cage 105m at right L2-L3. Insertion of lumbar hooks L2-L3 on right, and insertion thoracic hooks L2-L3 on left. OR Microscope.    SURGEON:  JJessy Oto MD     ASSISTANT:  JBenjiman Core PA-C  (Present throughout the entire procedure and necessary for completion of procedure in a timely manner)     ANESTHESIA:  General,supplemented with local anesthetic marcaine0.5% 1:1 exparel 1.3%, total 30 cc. Dr. FOla Spurr  EBL: 350cc  BLOOD RETURNED:  120cc cell saver.    COMPLICATIONS:Dural tear left L2-3, redo laminotomy, site of cage placement, freeing dura off undersurface of left L2 lamina. Repaired with interrupted 4-0 neurolon and duraseal.    COMPONENTS: Implant Name Type Inv. Item Serial No. Manufacturer Lot No. LRB No. Used  BONE MATRIX VIVIGEN 1CC - [id number] Implant BONE MATRIX VIVIGEN 1CC [id number] [place] [employer]  N/A 1  BONE MATRIX VIVIGEN 5CC - [id number] Implant BONE MATRIX VIVIGEN 5CC [id number] [place] [employer]  N/A 1  BONE CANC CHIPS 20CC - [id number] Implant BONE CANC CHIPS 20CC [id number] [place] [employer]  N/A 1  CAGE BULLET CONCORDE 9X10X27MM - [id number] CAGE BULLET CONCORDE 9X10X27MM  DEPUY SPINE  N/A 1  CONNECTOR EXPEDIUM 5.5X5.5-V - [id number] CONNECTOR EXPEDIUM 5.5X5.5-V   DEPUY SPINE  N/A 2  ROD EXPEDIUM PREBENT 95MM - [id number] ROD EXPEDIUM PREBENT 95MM  DEPUY SPINE  N/A 2  Expedium Hook    DEPUY SPINE  N/A 2  SCREW POLY EXPEDIUM 6.35 5X40 - [id number] SCREW POLY EXPEDIUM 6.35 5X40  DEPUY SPINE  N/A 1  Expedium Hook    DEPUY SPINE  N/A 2  Expedium Hook    DEPUY SPINE  N/A 1  Expedium Rod    DEPUY SPINE  N/A 1  SCREW SET SINGLE INNER - [id number] SCREW SET SINGLE INNER   DEPUY SPINE   N/A 4     PROCEDURE:    The patient was met in the holding area, and the appropriate left L1-2 and L2-3 lumbar levels identified and marked with an "X" and my initials. I had discussion with the patient in the preop holding area regarding consent form. 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 The patient was then transported to OR and was placed under general anesthetic without difficulty. The patient received appropriate preoperative antibiotic prophylaxis ancef 2 grams . Nursing staff inserted a Foley catheter under sterile conditions. The patient was then turned to a prone position using the [facility]. PAS. all pressure points well padded the arms at the side to 90 90. Standard prep with DuraPrep solution draped in the usual manner from the lower dorsal spine the mid sacral segment. Iodine Vi-Drape was used  and the incision was marked. Time-out procedure was called and correct. Loupe magnification and headlight were used during this portion procedure.  Skin in the midline between T12 to L4 was then infiltrated with local marcaine 0.5% 1:1 exparel 1.3% total of 30 cc used. Incision was then made through the skin and subcutaneous layers down to the patient's lumbodorsal fascia and spinous processes of T12 and L1. The incision then carried sharply lateral to the  supraspinous ligament and then continuing the lateral aspect of the spinous processes T12,L1 and L2 and carried lateral over the lateral facets at L2-3 and L3-4. Cobb elevators used to carefully elevate the paralumbar muscles off of the posterior elements using electrocautery carefully drilled bleeding and perform dissection of the muscle tissues of the preserving the facet at T12-L1. Continuing the exposure out laterally to expose the retained pedicle screws and rods at L2, L3 and L4. Bleeding controlled using electrocautery monopolar electrocautery. Cerebellar retractors were used for the incision.  C-arm fluoroscopy was then brought into the field and using C-arm fluoroscopy then a Coker clamp used to identify the interspinous process between T12 and L1. This was then marked with a single 2-0 Vicryl suture. Exposure of the retained hardware right side at L3-L4 and left side L2-L4 performed. A cross-link also exposed between the fasteners at L3 and L4 bilaterally and across the central portion of the incision site. The With the left L2 retained pedicle screw fastener was then removed and a bolt cutter used to divide the 5.5 mm titanium rod between the left L2 and L3 fastener holding onto the loose ends of the rod while dividing. This portion of the rod divided was then removed and a left L2 pedicle screw easily removed. Laminectomies were then performed at the T12-L1 level bilaterally using a 3 mm Kerrison to remove bone from the inferior aspect of lamina of T12 along with L rongeur. A ligamentum flavum was then able to be freed off of the ventral aspect of the inferior lamina of T12 and then carefully resected off the medial aspect of the facet at the T12-L1 level bilaterally off of the superior aspect of the lamina of L1 bilaterally. Care was taken to debride enough of the medial aspect of facet and the inferior lamina of to allow for placement of a sublaminar hook over the superior aspect of the lamina of L1.  Cottonoids were then placed over the laminotomy regions with thrombin-soaked Gelfoam. Next central laminotomy was performed at the L1-L2 level debriding interspinous ligament off the inferior aspect of the L1 spinous process and resecting a small amount of ligamentum flavum centrally at this level and off the superior aspect of the residual lamina of L2 of these central and the human flava was then identified and 3 mm Kerrison then introduced and used to debride ligamentum flavum both the left and right side and off the inferior aspect the lamina of L1 osteotomes were then used to remove bone off the medial aspect of facet at the L1-2 level was done bilaterally in order to allow for placement of hooks over the superior aspect of the lamina of of L2.  The inferior aspect of the lamina of the L2 was identified and a curet used to the debridement of soft tissue attachments and scar tissue to the inferior aspect of the lamina both on the left and right side. Curet was also used to define the the call on the right side with the right posterior elements. Osteotome then used to  resect the right inferior articular process of L2 and this was then carefully resected off the lateral aspect of the thecal sac using Kerrisons and a Penfield 4. Osteotome then used to resect the superior articular process of L3 on the right side gaining access to the right L2-3 neuroforamen and the superior aspect of the right L3 pedicle. Thecal sac was carefully freed up along the right side. While removing scar tissue off the inferior aspect of the tendon of L2 on the left side dural tear occurred with CSF leak. This was initially noted to be a bleb and cottonoids placed that eventually allowed for a CSF leak. About 2-3 mm of lamina was then resected off the inferior aspect of the lamina of L2 on the left side. The operating room microscope was draped sterilely and brought into the field and under the operating room microscope then the dural  tear was carefully evaluated on the left side found to be about a 3 eater dural tear with no neural involvement. As bleeding was minimal at this point dural repair was undertaken and of the dural edges were reapproximated with interrupted 4-0 Nurolon sutures. Valsalva performed following a dural repair demonstrated no significant leak. Thrombin-soaked Gelfoam cottonoids placed over the dural site and surgery continued.  The right inferior articular process of L2-3 was resected in order to provide for exposure of the right side  L2-3 neuroforamen for ease of performing a redo TLIF (transforaminal lumbar interbody fusion) residual portions of the right L2 lamina were also resected first beginning with the Leksell rongeur and then resecting using 2 and 3 mm Kerrison the central and right portions of the lamina of L2 performing foraminotomies on the right side at the L2-3 level.  The right L3 nerve root identified  the medial aspect of the L3 pedicle. Superior articular process of L3 was then resected from the right side further decompressing the right L3 nerve and providing for exposure of the area just superior to the L3 pedicle for a placement of cage. A moderate disc herniation was found on the right side at the L2-3 level and this was resected. The OR microscope was used for freeing up of the right side of the thecal sac at the L2-3 Level, elevating the L3 nerve root away from the medial right L3 pedicle, exposing the right L2-3 disc above the right L3 pedicle, Incising the disc and then resecting the disc with micropituitary and then pituitary with teeth.The right side recut resecting the superior articular process of L3 overlying the L2 nerve root as it exited at the L2-3 level decompressing the lateral recess along the medial aspect of the pedicle of L3 and resecting the superior to the process of L3 and decompressing the right L2 neuroforamen.A blunt nerve hook used to explore the spinal canal and  ensure Both the right L3 and L2 nerve roots were well decompressed.  Attention then turned to placement of the right transforaminal lumbar interbody fusion cages. Using a Penfield 4 the lateral aspect of the thecal sac and the inferior aspect of the L3 nerve root on the right side at the L2-3 level was carefully drilled. The thecal sac could then easily be retracted in the posterior lateral aspect of the L2-3 disc was exposed 15 blade scalpel used to incise the osteotome used to resect a small portion of bone off the superior aspect of the posterior superior vertebral body of L2 in order to ease the entry into the L2-3 disc space. A  pituitary rongeur was then able to be introduced in the disc space debrided it of degenerative disc material. 7 mm through 10 mm dilators were used to dialate  the L2-3 disc space on the right side to 10m successfully and using small curettes and the disc space was debrided a minimal degenerative disc present in the endplates debrided to bleeding endplate bone.retained cage on the left side was able to be palpated with the instruments used to debride the disc space on the right side. Trial cage placement within the disc place allowed for an 10 mm cage. An 10 mm lordotic Concorde cage was carefully packed with morcellized bone graft and the been harvested from previous laminotomies additional bone graft local morselized, cancellous allograft chips and vivigen was then packed into the intervertebral disc space using the 8 mm trial to impacted the graft multiple times. With this then a 10 mm x 27 mm lorditic Concorde cage was introdudced into the disc space on the right side in the correct degree convergence and then impacted then subset beneath the posterior aspect of the disc space by about 3 or 4 mm. Bleeding controlled using bipolar electrocautery thrombin soaked gel cottonoids. C-arm was then used to verify position alignment of the cage at the elbow to 3 level. Cage is found to be  extended to the anterior aspect of the disc space of was repositioned slightly by backing out posteriorly small amount using the insertion tool. Repeat C-arm demonstrated the cage in good position and alignment with restoration of the height of the disc and restoration of lumbar lordosis at the L2-3 level. The posterior aspect of the disc space was packed with a small amount autogenous local bone graft that been harvested from the central laminectomy, cancellous allograft chips and vivigen.  The cage was subset beneath the posterior aspect of the disc space by about 3-4 mm .   Bleeding was hemostasis attention was turned to the placement of the rods and Rod sleeves . The exposed retained hardware bilateral L3 to L4 pedicle screw fasteners and rods. The crosslink was removed at the L3-4 level  Crosslink was removed and the connecting sleeves connected to the retained rod at the L3-4 level bilaterally. With the transforaminal lumbar interbody fusion portion of the case  completed bleeders were controlled using bipolar electrocautery thrombin-soaked Gelfoam were appropriate. 2 thoracic hooks placed on the left over the superior aspect of the L1 and L2 lamina. 2  lumbar hooks placed on the right over the superior aspect of L1 and L2 lamina and then each carefully placed and aligned to allow for placement of rods. The 963mprebent rod was then contoured using a template on the right side and  placed into the rod sleeve on the right side  extending from L3-4 rod connector sleeve into each of the right L2 and L1 lumbar superior hooks and caps carefully placed loosely tightened. Attention turned to the left side were similarly thoracic hooks at L1 and L2 superior aspect of the lamina were carefully adjusted to allow for a better pattern of hooks to allow for placement of fixation rod template for the rod was then taken and a precontoured quarter inch 9542mitanium rod was further contoured and was placed. The rod  placed into the connector sleeve at the L3-4 level and reduced into the right L2 and L1 thoracic hooks. Both rod connector sleeve screws were tightened to 80 foot lbs. The right L1 rod fastener cap was then tightened 85 pounds  similarly. At the right side disc space at L2 hook fastened to the 5.76m rod and the compressor used to compress the L2-3 disc by placing a rod gripper and compressing between the gripper and the L2 hook. Posteriorly and the L2 hook fastener cap was torqued to 85 foot lbs, the right side between L1 and L2 hooks then compressed by loosening the cap at the L1 level, using the compressor and tightening the right L1 cap to 85 foot lbs.  Similarly this was done on the left side at L1-2 and L2-3 fastener hooks were compressed and tightened 85 pounds. Irrigation was carried out with copious amounts of saline solution this was done throughout the case. Cell Saver was used during the case.Permanent C-arm images were obtained in AP and lateral and oblique planes.  Under the operating room microscope then evaluation of the left L2-3 dural tear demonstrated no leak with valsalva to 30 mmHg. With this then DuraSeal was carefully placed over the dural repair site obtaining watertight seal. Decortication was carried out over the transverse processes at the L3 level the L2 level lateral aspect of the L1 pars areas area L1-2 and L2-3 facets were decorticated and bone grafted. Combination of vivigen, local autogenous bone graft and cancellus allograft chips Was used.  Remaining local bone graft was then applied along the left and right lateral posterior lateral region extending from L1 through L3 posterior facets following decortication of the facets and along the left L1-2 interlaminar area posteriorly. Gelfoam was then removed the lumbodorsal musculature carefully exam debrided of any devitalized tissue following removal of Vicryl retractors were the bleeders were controlled using electrocautery and  the area dorsal lumbar muscle were then approximated in the midline with interrupted #1 Vicryl sutures loose the dorsal fascia was reattached to the spinous process of T12, L1 and L2 to superiorly and  inferiorly this was done with #1 Vicryl sutures. The para lumbar muscle approximated over the lower incision site with #1 vicryl subcutaneous layers approximated with 0 Vicryl sutures and 2-0 Vicryl sutures. Skin was closed with a running subcutaneous stitch of 3-0 Vicryl Dermabond was applied then MedPlex bandage. All instrument and sponge counts were correct. The patient was then returned to a supine position on her bed reactivated extubated and returned to the recovery room in satisfactory condition.    JBenjiman Core PA-C perform the duties of assistant surgeon during this case. He was present from the beginning of the case to the end of the case assisting in transfer the patient from his stretcher to the OR table and back to the stretcher at the end of the case. Assisted in careful retraction and suction of the laminectomy site delicate neural structures operating under the operating room microscope. He performed closure of the incision from the fascia to the skin applying the dressing.      NITKA,JAMES E  08/09/2014, 8:30 PM

## 2014-08-09 NOTE — H&P (Signed)
[name] is an 59 y.o. female.   [name] returns today.  She has had an MRI scan done.  She has lumbar fusion from the L2 to S1.  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  Her studies have shown well developed nonunion at this segment.  She underwent most recent MRI, which demonstrates that she has apparent severe degenerative disk disease at L2-3 with retrolisthesis that is nearly a grade 2 retrolisthesis of L2 on L3.  We were only able to place a single pedicle screw on the left side at L2 on her fixation as her pedicle width was quite narrowed and about 3 to 3.5 mm.  Considerations are extending her fusion up an additional 1 or 2 segments in order to gain stability and redoing the TLIFs with perhaps placement of BMP.     The risks of surgery are discussed with [name].  She relates that she cannot live the way that she is with the severe pain that she is having.  She does poorly with narcotic medicines and has intractable nausea so that we are really unable to treat her pain with medications.  We discussed the risks and benefits of undergoing further intervention.  The concern has to do with internal fixation as we are seeing very little areas of purchase within her pedicles.  Pedicle screws can be placed, but they likely will need to be lateral in their position and alignment and low on the pedicle and then be inserted.  I discussed with [name] the possibility of using a hook-type construct at the upper end of the fusion site and this in order to try and regain some purchase.  Sublaminar wires may also be of benefit.     In either case I think that she has a significant problem here with developing kyphosis.  She does not wish to be seen at [facility] and her insurance will not cover it.   Her insurance would allow her to be seen at [facility].  I think Dr. Erlinda Hong is the attending spine surgeon there.  [name] indicates that she does not want to go anywhere, but to have her surgery here.     I have discussed with [name] her situation.  I think that it is complex, but I also think that considering that this is a situation where as she is not healing the surgery that we had done previously on her, I feel as though we should go ahead and extend her fusion up an additional 2-3 levels in order to gain purchase in order to be able to stabilize her spine and allow the fusion to take place at the L1-2 level.  This would also allow [name] to perhaps increase the degree of lordosis present and try and decrease her tendencies to proximal junction kyphosis.         Past Medical History  Diagnosis Date  . Hypertension   . Asthma   . Cold     recent  rx  . GERD (gastroesophageal reflux disease)   . H/O hiatal hernia   . Arthritis   . Cancer     cervical , skin  . Shortness of breath   . Sleep apnea     ' mild " does not use cpap  . Fibromyalgia   .  PONV (postoperative nausea and vomiting)     Past Surgical History  Procedure Laterality Date  . Cervical conization w/bx    . Appendectomy    . Abdominal hysterectomy  04  . Cholecystectomy  03  . Cervical disc surgery  06  . Back surgery      x3  . Tonsillectomy    . Lumbar fusion  04/07/2013    L 2 L3 L4    with rod     DR Waniya Hoglund    No family history on file. Social History:  reports that she has never smoked. She has never used smokeless tobacco. She reports that she does not drink alcohol or use illicit drugs.  Allergies:  Allergies  Allergen Reactions  . Ace Inhibitors Cough  . Asa [Aspirin] Nausea And Vomiting  . Cephalosporins Nausea And Vomiting  . Doxycycline Nausea And Vomiting  . Morphine And Related     High blood pressure   . Novocain [Procaine] Swelling  . Other Nausea And Vomiting    Sensitive to pain  medications  . Statins Other (See Comments)    myalgia  . Sulfa Antibiotics Hives and Nausea And Vomiting  . Tricyclic Antidepressants Nausea And Vomiting    Cymbalta and alike meds  . Ultram [Tramadol] Other (See Comments)    myalgia  . Zetia [Ezetimibe]     Muscle Aches  . Codeine Hives and Palpitations    Hydrocodone, Ultram    No prescriptions prior to admission    No results found for this or any previous visit (from the past 48 hour(s)). No results found.  Review of Systems  Unable to perform ROS Constitutional: Negative for fever, chills, weight loss, malaise/fatigue and diaphoresis.  HENT: Positive for congestion. Negative for ear discharge, ear pain, hearing loss, nosebleeds, sore throat and tinnitus.   Eyes: Negative for blurred vision, double vision, photophobia, pain, discharge and redness.  Respiratory: Positive for cough. Negative for hemoptysis, sputum production, shortness of breath, wheezing and stridor.   Cardiovascular: Positive for palpitations and claudication. Negative for chest pain, orthopnea, leg swelling and PND.  Gastrointestinal: Negative for heartburn, nausea, vomiting, abdominal pain, diarrhea, constipation, blood in stool and melena.  Genitourinary: Negative for dysuria, urgency, frequency, hematuria and flank pain.  Musculoskeletal: Positive for myalgias, back pain and neck pain. Negative for joint pain and falls.  Skin: Negative for itching and rash.  Neurological: Positive for tingling, sensory change and weakness. Negative for dizziness, tremors, speech change, focal weakness, seizures, loss of consciousness and headaches.  Endo/Heme/Allergies: Negative for environmental allergies and polydipsia. Does not bruise/bleed easily.  Psychiatric/Behavioral: Negative for depression, suicidal ideas, hallucinations, memory loss and substance abuse. The patient is not nervous/anxious and does not have insomnia.     There were no vitals taken for this  visit. Physical Exam  Constitutional: She is oriented to person, place, and time. She appears well-developed and well-nourished. No distress.  HENT:  Head: Normocephalic and atraumatic.  Right Ear: External ear normal.  Left Ear: External ear normal.  Nose: Nose normal.  Mouth/Throat: Oropharynx is clear and moist. No oropharyngeal exudate.  Eyes: Conjunctivae and EOM are normal. Pupils are equal, round, and reactive to light. Right eye exhibits no discharge. Left eye exhibits no discharge. No scleral icterus.  Neck: Normal range of motion. Neck supple. No JVD present. No tracheal deviation present. No thyromegaly present.  Cardiovascular: Normal rate, regular rhythm, normal heart sounds and intact distal pulses.  Exam reveals no gallop and no friction  rub.   No murmur heard. Respiratory: Effort normal and breath sounds normal. No stridor. No respiratory distress. She has no wheezes. She has no rales. She exhibits no tenderness.  GI: She exhibits no distension and no mass. There is no tenderness. There is no rebound and no guarding.  Musculoskeletal: She exhibits tenderness. She exhibits no edema.  Lymphadenopathy:    She has no cervical adenopathy.  Neurological: She is alert and oriented to person, place, and time. She displays abnormal reflex. No cranial nerve deficit. She exhibits abnormal muscle tone. Coordination normal.  Skin: Skin is warm and dry. No rash noted. She is not diaphoretic. No erythema. No pallor.  Psychiatric: She has a normal mood and affect. Her behavior is normal. Judgment and thought content normal.    PHYSICAL EXAMINATION:  She has good strength in foot dorsiflexion and plantar flexion bilaterally.  Normal knee extension and flexion strength.  Normal hip abduction and adduction and hip flexion strength also noted.  Her major complaints appear to be pain into the left side and left lower extremity.  It has a radicular-type quality.  ASSESSMENT AND PLAN:  This patient  has degenerative disk disease with retrolisthesis occurring at L2-3, the upper TLIF segment on a previous fusion that extends from L2 to S1.  She has small pedicles at the thoracolumbar junction, which makes our ability to fix this area somewhat more difficult, but nonetheless she has developed nonunion so that I think that she will need a redo fusion at the L2-3 level with extension of her fusion up to gain fixation so that this will go on and heal.  Taking this up to the T10 level probably would be best, but if we can gain fixation at L1 perhaps stopping at this level with no TLIF at that level would provide an area of decreased stress and perhaps decreased tendencies for even further kyphosis proximal to this area.  Elease is unable to take many strong narcotics.  We will continue her on her present medicines.  I will go ahead and plan to schedule her for intervention.    OWENS,Bertin Inabinet M 08/09/2014, 3:12 AM

## 2014-08-10 LAB — CBC WITH DIFFERENTIAL/PLATELET
BASOS ABS: 0 10*3/uL (ref 0.0–0.1)
BASOS PCT: 0 % (ref 0–1)
EOS ABS: 0 10*3/uL (ref 0.0–0.7)
EOS PCT: 0 % (ref 0–5)
Lymphocytes Relative: 8 % — ABNORMAL LOW (ref 12–46)
Lymphs Abs: 1.2 10*3/uL (ref 0.7–4.0)
MONO ABS: 1 10*3/uL (ref 0.1–1.0)
Monocytes Relative: 7 % (ref 3–12)
Neutro Abs: 12.1 10*3/uL — ABNORMAL HIGH (ref 1.7–7.7)
Neutrophils Relative %: 85 % — ABNORMAL HIGH (ref 43–77)

## 2014-08-10 LAB — CBC
HCT: 35.5 % — ABNORMAL LOW (ref 36.0–46.0)
Hemoglobin: 11.8 g/dL — ABNORMAL LOW (ref 12.0–15.0)
MCH: 28 pg (ref 26.0–34.0)
MCHC: 33.2 g/dL (ref 30.0–36.0)
MCV: 84.3 fL (ref 78.0–100.0)
PLATELETS: 246 10*3/uL (ref 150–400)
RBC: 4.21 MIL/uL (ref 3.87–5.11)
RDW: 13.6 % (ref 11.5–15.5)
WBC: 14.1 10*3/uL — AB (ref 4.0–10.5)

## 2014-08-10 LAB — URINALYSIS, ROUTINE W REFLEX MICROSCOPIC
BILIRUBIN URINE: NEGATIVE
GLUCOSE, UA: NEGATIVE mg/dL
Hgb urine dipstick: NEGATIVE
Ketones, ur: NEGATIVE mg/dL
Leukocytes, UA: NEGATIVE
Nitrite: NEGATIVE
PH: 6 (ref 5.0–8.0)
Protein, ur: NEGATIVE mg/dL
SPECIFIC GRAVITY, URINE: 1.005 (ref 1.005–1.030)
Urobilinogen, UA: 0.2 mg/dL (ref 0.0–1.0)

## 2014-08-10 LAB — BASIC METABOLIC PANEL
ANION GAP: 8 (ref 5–15)
BUN: 6 mg/dL (ref 6–20)
CHLORIDE: 106 mmol/L (ref 101–111)
CO2: 26 mmol/L (ref 22–32)
Calcium: 8.6 mg/dL — ABNORMAL LOW (ref 8.9–10.3)
Creatinine, Ser: 0.88 mg/dL (ref 0.44–1.00)
GFR calc Af Amer: >60 mL/min (ref >60)
Glucose, Bld: 133 mg/dL — ABNORMAL HIGH (ref 65–99)
POTASSIUM: 4 mmol/L (ref 3.5–5.1)
SODIUM: 140 mmol/L (ref 135–145)

## 2014-08-10 MED FILL — Thrombin For Soln 20000 Unit: CUTANEOUS | Qty: 1 | Status: AC

## 2014-08-10 NOTE — Progress Notes (Signed)
Subjective: preop left leg pain better.  C/o nausea, and some headache. Had dura repair.    Objective: Vital signs in last 24 hours: Temp:  [97.6 F (36.4 C)-98.4 F (36.9 C)] 98.4 F (36.9 C) (05/17 0615) Pulse Rate:  [70-98] 87 (05/17 0615) Resp:  [9-15] 15 (05/16 1540) BP: (115-164)/(56-87) 122/74 mmHg (05/17 0615) SpO2:  [85 %-100 %] 98 % (05/17 0615)  Intake/Output from previous day: 05/16 0701 - 05/17 0700 In: 4588.3 [P.O.:240; I.V.:4128.3; Blood:120; IV Piggyback:100] Out: 2263 [FHLKT:6256; Blood:350] Intake/Output this shift:     Recent Labs  08/10/14 0454  HGB 11.8*    Recent Labs  08/10/14 0454  WBC 14.1*  RBC 4.21  HCT 35.5*  PLT 246    Recent Labs  08/10/14 0454  NA 140  K 4.0  CL 106  CO2 26  BUN 6  CREATININE 0.88  GLUCOSE 133*  CALCIUM 8.6*   No results for input(s): LABPT, INR in the last 72 hours.  Exam:  Alert and oriented.  Neurologically intact.  No focal neurologic deficits.  bilat calves nontender.    Assessment/Plan: S/p lumbar fusion with dura repair.  Postop nausea and headache.  Will keep patient supine x 3 days postop.  Continue antiemetic as needed.  Leukocytosis.  Will get ua/urine c&s.  Anticipate d/c home possibly Friday.     OWENS,JAMES M 08/10/2014, 8:13 AM

## 2014-08-11 ENCOUNTER — Inpatient Hospital Stay (HOSPITAL_COMMUNITY): Payer: Medicare Other

## 2014-08-11 LAB — BASIC METABOLIC PANEL
Anion gap: 8 (ref 5–15)
BUN: 6 mg/dL (ref 6–20)
CHLORIDE: 100 mmol/L — AB (ref 101–111)
CO2: 24 mmol/L (ref 22–32)
Calcium: 7.8 mg/dL — ABNORMAL LOW (ref 8.9–10.3)
Creatinine, Ser: 0.77 mg/dL (ref 0.44–1.00)
GFR calc Af Amer: >60 mL/min (ref >60)
GFR calc non Af Amer: >60 mL/min (ref >60)
Glucose, Bld: 166 mg/dL — ABNORMAL HIGH (ref 65–99)
Potassium: 3.5 mmol/L (ref 3.5–5.1)
Sodium: 132 mmol/L — ABNORMAL LOW (ref 135–145)

## 2014-08-11 LAB — CBC WITH DIFFERENTIAL/PLATELET
BASOS ABS: 0 10*3/uL (ref 0.0–0.1)
Basophils Relative: 0 % (ref 0–1)
EOS ABS: 0 10*3/uL (ref 0.0–0.7)
EOS PCT: 0 % (ref 0–5)
HEMATOCRIT: 34.7 % — AB (ref 36.0–46.0)
HEMOGLOBIN: 11.7 g/dL — AB (ref 12.0–15.0)
Lymphocytes Relative: 10 % — ABNORMAL LOW (ref 12–46)
Lymphs Abs: 1.5 10*3/uL (ref 0.7–4.0)
MCH: 28.7 pg (ref 26.0–34.0)
MCHC: 33.7 g/dL (ref 30.0–36.0)
MCV: 85 fL (ref 78.0–100.0)
Monocytes Absolute: 1.5 10*3/uL — ABNORMAL HIGH (ref 0.1–1.0)
Monocytes Relative: 11 % (ref 3–12)
Neutro Abs: 11.2 10*3/uL — ABNORMAL HIGH (ref 1.7–7.7)
Neutrophils Relative %: 79 % — ABNORMAL HIGH (ref 43–77)
Platelets: 215 10*3/uL (ref 150–400)
RBC: 4.08 MIL/uL (ref 3.87–5.11)
RDW: 13.5 % (ref 11.5–15.5)
WBC: 14.2 10*3/uL — ABNORMAL HIGH (ref 4.0–10.5)

## 2014-08-11 LAB — URINE CULTURE
Colony Count: NO GROWTH
Culture: NO GROWTH

## 2014-08-11 MED ORDER — BISACODYL 10 MG RE SUPP
10.0000 mg | Freq: Once | RECTAL | Status: AC
  Administered 2014-08-11: 10 mg via RECTAL
  Filled 2014-08-11: qty 1

## 2014-08-11 MED ORDER — FENTANYL 25 MCG/HR TD PT72
25.0000 ug | MEDICATED_PATCH | TRANSDERMAL | Status: DC
  Administered 2014-08-12: 25 ug via TRANSDERMAL
  Filled 2014-08-11: qty 1

## 2014-08-11 MED ORDER — METOCLOPRAMIDE HCL 5 MG/ML IJ SOLN
5.0000 mg | Freq: Four times a day (QID) | INTRAMUSCULAR | Status: AC
  Administered 2014-08-11 – 2014-08-12 (×5): 5 mg via INTRAVENOUS
  Filled 2014-08-11 (×5): qty 2

## 2014-08-11 NOTE — Progress Notes (Signed)
Patient stated her bladder felt full and asked for foley to be checked. Foley catheter tip was found out of patient lying in the bed with the balloon tip deflated. In and out cath done at 2255 got out 1026ml of clear yellow urine. Patient told to notify staff when urge to void. Will continue to monitor

## 2014-08-11 NOTE — Progress Notes (Signed)
Subjective: C/o nausea and vomiting this morning. Only had nausea yesterday and feels worse. Continues to have a slight headache.  Some abd discomfort.  Some flatus.  Urinary retention last night and had catheter put back in.  Denies leg pain, numbness.     Objective: Vital signs in last 24 hours: Temp:  [97.9 F (36.6 C)-100.5 F (38.1 C)] 97.9 F (36.6 C) (05/18 0507) Pulse Rate:  [71-94] 94 (05/18 0507) Resp:  [16] 16 (05/18 0507) BP: (124-135)/(66-73) 126/73 mmHg (05/18 0507) SpO2:  [97 %-100 %] 98 % (05/18 0507)  Intake/Output from previous day: 05/17 0701 - 05/18 0700 In: 240 [P.O.:240] Out: 1000 [Urine:1000] Intake/Output this shift:     Recent Labs  00/71/21 9758  HGB DUPLICATE REQUEST  SEE [id number]  11.8*    Recent Labs  98/26/41 5830  WBC DUPLICATE REQUEST  SEE [id number]  80.8*  RBC DUPLICATE REQUEST  SEE [id number]  1.59  HCT DUPLICATE REQUEST  SEE [id number]  29.2*  PLT DUPLICATE REQUEST  SEE [id number]  246    Recent Labs  08/10/14 0454  NA 140  K 4.0  CL 106  CO2 26  BUN 6  CREATININE 0.88  GLUCOSE 133*  CALCIUM 8.6*   No results for input(s): LABPT, INR in the last 72 hours.  Exam:  Wound looks good.  No drainage or signs of infection.  bilat calves nontender, NVI.  No focal neurologic deficits. Abdomen tender to palpation.    Assessment/Plan: Will get stat KUB to r/o ileus.  Continue to keep supine for now.    Sueanne Maniaci M 08/11/2014, 7:32 AM

## 2014-08-11 NOTE — Progress Notes (Signed)
Patient ID: LILLE KARIM, female   DOB: 1956-03-20, 59 y.o.   MRN: [id number]     Subjective: 2 Days Post-Op Procedure(s) (LRB): Re-do transforaminal lumbar interbody fusion L2-3 with  vivigen, cancellous allograft and local bone graft, Extension of hardware to L1-2 with posterior fusion L1-2 with removal of left L2 pedicle screw and rod. Insertion of  transforaminal lumbar interbody Concorde Bullett cage 48mm at L2-L3. Insertion of lumbar hooks L2-L3 on right, and insertion thoracic hooks L2-L3 on left. (N/A)Nausea, vomitting, head ache some photophobia. Foley removed inadvertently and then replaced, difficulty voiding with it out. Low grade fever 100 Likely atelectasis/ileus.  Patient reports pain as moderate.    Objective:   VITALS:  Temp:  [97.9 F (36.6 C)-100.5 F (38.1 C)] 97.9 F (36.6 C) (05/18 0507) Pulse Rate:  [71-94] 94 (05/18 0507) Resp:  [16] 16 (05/18 0507) BP: (124-135)/(66-73) 126/73 mmHg (05/18 0507) SpO2:  [97 %-100 %] 98 % (05/18 0507)  Neurologically intact ABD soft Neurovascular intact Sensation intact distally Intact pulses distally Dorsiflexion/Plantar flexion intact Incision: no drainage Abdomen is distended and tympanic, Xray with large bowel and small bowel ileus.   LABS  Recent Labs  78/93/81 0175  HGB DUPLICATE REQUEST  SEE [id number]  52.7*  WBC DUPLICATE REQUEST  SEE [id number]  35.3*  PLT DUPLICATE REQUEST  SEE [id number]  246    Recent Labs  08/10/14 0454  NA 140  K 4.0  CL 106  CO2 26  BUN 6  CREATININE 0.88  GLUCOSE 133*   No results for input(s): LABPT, INR in the last 72 hours.   Assessment/Plan: 2 Days Post-Op Procedure(s) (LRB): Re-do transforaminal lumbar interbody fusion L2-3 with  vivigen, cancellous allograft and local bone graft, Extension of hardware to L1-2 with posterior fusion L1-2 with removal of left L2 pedicle screw and rod. Insertion of  transforaminal lumbar interbody Concorde Bullett cage 69mm at L2-L3. Insertion  of lumbar hooks L2-L3 on right, and insertion thoracic hooks L2-L3 on left. (N/A)  Change diet to clear liquids. Keep HOB 10 degrees or less. Continue IVF, check electrolytes, try to stimulate bowels from distal, dulcolaz suppository. Needs to remain down for 24 more hours, myelogram if HA and nuchal signs persist. Continue foley due to acute urinary retention and urinary output monitoring  Maycol Hoying E 08/11/2014, 8:17 AM

## 2014-08-12 MED ORDER — TAMSULOSIN HCL 0.4 MG PO CAPS
0.4000 mg | ORAL_CAPSULE | Freq: Every day | ORAL | Status: DC
  Administered 2014-08-12: 0.4 mg via ORAL
  Filled 2014-08-12: qty 1

## 2014-08-12 NOTE — Progress Notes (Signed)
Patient refusing fentanyl patch 41mcg. Pain 0/10, patching making patient nauseous and patient stated "take it off". Patch wasted in black box with Beth-Labbato, RN.

## 2014-08-12 NOTE — Evaluation (Signed)
Physical Therapy Evaluation Patient Details Name: [name] MRN: [id number] DOB: 1955-12-17 Today's Date: 08/12/2014   History of Present Illness  59 y.o. female s/p Extension of hardware to L1-2 with posterior fusion L1-2 with removal of left L2 pedicle screw, and Re-do transforaminal lumbar interbody fusion L2-3  Clinical Impression  Patient is seen following the above procedure, presenting with functional limitations due to the deficits listed below (see PT Problem List). Ambulates 350 feet at a min guard level, requiring intermittent min assist for stability. Encouraged use of RW at home. She will have 24 hour supervision as needed. Safely completed stair training. Feel she is adequate for d/c from a mobility standpoint when medically ready. Will continue to follow and progress until d/c.  Follow Up Recommendations Supervision for mobility/OOB;No PT follow up    Equipment Recommendations  None recommended by PT    Recommendations for Other Services       Precautions / Restrictions Precautions Precautions: Back Precaution Booklet Issued: Yes (comment) Precaution Comments: reviewed Required Braces or Orthoses: Spinal Brace Spinal Brace: Thoracolumbosacral orthotic;Applied in sitting position Restrictions Weight Bearing Restrictions: No      Mobility  Bed Mobility Overal bed mobility: Needs Assistance Bed Mobility: Rolling;Sidelying to Sit Rolling: Supervision Sidelying to sit: Supervision       General bed mobility comments: supervision for safety. Educated on log roll technique. No assist needed  Transfers Overall transfer level: Needs assistance Equipment used: None Transfers: Sit to/from Stand Sit to Stand: Supervision         General transfer comment: supervision for safety. Mild sway noted however did not require assist. VC to maintain back precautions.  Ambulation/Gait Ambulation/Gait assistance: Min assist Ambulation Distance (Feet): 350  Feet Assistive device: None Gait Pattern/deviations: Step-through pattern;Decreased stride length;Scissoring;Staggering left;Staggering right;Narrow base of support Gait velocity: decreased   General Gait Details: Pt ambulating majority of distance at a min guard level however she demonstrates intermittent staggering to left and right needing min assist for stability. VC for awarness. More notable with turns. Pt denies dizziness or LE symptoms.  Stairs Stairs: Yes Stairs assistance: Supervision Stair Management: Two rails;Step to pattern;Alternating pattern;Forwards Number of Stairs: 3 (x2) General stair comments: Educated on safe stair navigation techniques. VC for sequencing. able to perform safely with both step-to and alternating step patterns. No loss of balance. Feels confident with task  Wheelchair Mobility    Modified Rankin (Stroke Patients Only)       Balance Overall balance assessment: Needs assistance Sitting-balance support: No upper extremity supported;Feet supported Sitting balance-Leahy Scale: Good     Standing balance support: No upper extremity supported Standing balance-Leahy Scale: Fair                               Pertinent Vitals/Pain Pain Assessment: 0-10 Pain Score: 1  Pain Location: back Pain Descriptors / Indicators: Sore Pain Intervention(s): Monitored during session;Repositioned    Home Living Family/patient expects to be discharged to:: Private residence Living Arrangements: Spouse/significant other Available Help at Discharge: Family;Available 24 hours/day Type of Home: Mobile home Home Access: Stairs to enter Entrance Stairs-Rails: Right;Left;Can reach both Entrance Stairs-Number of Steps: 4 Home Layout: One level Home Equipment: Walker - 2 wheels;Cane - single point;Bedside commode;Shower seat      Prior Function Level of Independence: Independent               Hand Dominance   Dominant Hand: Right  Extremity/Trunk Assessment   Upper Extremity Assessment: Defer to OT evaluation           Lower Extremity Assessment: Overall WFL for tasks assessed         Communication   Communication: No difficulties  Cognition Arousal/Alertness: Awake/alert Behavior During Therapy: WFL for tasks assessed/performed Overall Cognitive Status: Within Functional Limits for tasks assessed                      General Comments General comments (skin integrity, edema, etc.): Reviewed procedure for donning/doffing TLSO, husband present and observing techniques.    Exercises General Exercises - Lower Extremity Ankle Circles/Pumps: AROM;Both;10 reps;Seated Long Arc Quad: Both;10 reps;Strengthening;Seated      Assessment/Plan    PT Assessment Patient needs continued PT services  PT Diagnosis Difficulty walking;Abnormality of gait;Acute pain   PT Problem List Decreased range of motion;Decreased activity tolerance;Decreased strength;Decreased balance;Decreased mobility;Decreased knowledge of use of DME;Decreased knowledge of precautions;Pain  PT Treatment Interventions DME instruction;Gait training;Stair training;Functional mobility training;Therapeutic activities;Therapeutic exercise;Balance training;Neuromuscular re-education;Modalities;Patient/family education   PT Goals (Current goals can be found in the Care Plan section) Acute Rehab PT Goals Patient Stated Goal: go home and heal PT Goal Formulation: With patient Time For Goal Achievement: 08/26/14 Potential to Achieve Goals: Good    Frequency Min 5X/week   Barriers to discharge        Co-evaluation               End of Session Equipment Utilized During Treatment: Back brace Activity Tolerance: Patient tolerated treatment well Patient left: in chair;with call bell/phone within reach;with family/visitor present Nurse Communication: Mobility status         Time: 1540-0867 PT Time Calculation (min) (ACUTE ONLY):  20 min   Charges:   PT Evaluation $Initial PT Evaluation Tier I: 1 Procedure     PT G CodesEllouise Newer 08/12/2014, 4:54 PM Elayne Snare, [place]

## 2014-08-12 NOTE — Care Management Note (Signed)
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 Patient under went L1-2, L2-3  fusion and extension of Hardware.        Action/Plan: Patient has no home health needs. Has necessary DME from previous surgery.   Expected Discharge Date: 08/13/14                  Expected Discharge Plan: Home self care    In-House Referral:  NA  Discharge planning Services  CM Consult  Post Acute Care Choice:  NA Choice offered to:  NA, Patient  DME Arranged: NA    DME Agency:     HH Arranged:    [place] Agency:     Status of Service:  Completed, signed off  Medicare Important Message Given:    Date Medicare IM Given:    Medicare IM give by:    Date Additional Medicare IM Given:    Additional Medicare Important Message give by:     If discussed at [place] of Stay Meetings, dates discussed:    Additional Comments:  Ninfa Meeker, RN 08/12/2014, 4:04 PM

## 2014-08-12 NOTE — Progress Notes (Signed)
Patient ID: [name], female   DOB: [birth year], 59 y.o.   MRN: [id number]     Subjective: 3 Days Post-Op Procedure(s) (LRB): Re-do transforaminal lumbar interbody fusion L2-3 with  vivigen, cancellous allograft and local bone graft, Extension of hardware to L1-2 with posterior fusion L1-2 with removal of left L2 pedicle screw and rod. Insertion of  transforaminal lumbar interbody Concorde Bullett cage 54mm at L2-L3. Insertion of lumbar hooks L2-L3 on right, and insertion thoracic hooks L2-L3 on left. (N/A) Awake, alert and oriented x 4. I feel better today. No HA, still some nausea. Scopalamine patch d/ced. Fentanyl patch ordered, not sure if patch is on. Passing quite a bit of gas, some belching, no emesis. Abdomen feels better. Review of chart from surgery last 2015 shows ileus with N, V post op also. Urinary retention, foley is In place.   Patient reports pain as mild.    Objective:   VITALS:  Temp:  [99.1 F (37.3 C)-99.3 F (37.4 C)] 99.1 F (37.3 C) (05/19 0613) Pulse Rate:  [92-96] 93 (05/19 0613) Resp:  [16] 16 (05/18 1413) BP: (106-129)/(56-68) 129/68 mmHg (05/19 0613) SpO2:  [92 %-93 %] 93 % (05/19 6720)  Neurologically intact ABD soft Neurovascular intact Sensation intact distally Intact pulses distally Dorsiflexion/Plantar flexion intact Incision: no drainage No cellulitis present   LABS  Recent Labs  08/10/14 0454 94/70/96 2836  HGB DUPLICATE REQUEST  SEE [id number]  65.4* 65.0*  WBC DUPLICATE REQUEST  SEE [id number]  68.1* 27.5*  PLT DUPLICATE REQUEST  SEE [id number]  246 215    Recent Labs  08/10/14 0454 08/11/14 0845  NA 140 132*  K 4.0 3.5  CL 106 100*  CO2 26 24  BUN 6 6  CREATININE 0.88 0.77  GLUCOSE 133* 166*   No results for input(s): LABPT, INR in the last 72 hours.   Assessment/Plan: 3 Days Post-Op Procedure(s) (LRB): Re-do transforaminal lumbar interbody fusion L2-3 with  vivigen, cancellous allograft and local bone graft, Extension of  hardware to L1-2 with posterior fusion L1-2 with removal of left L2 pedicle screw and rod. Insertion of  transforaminal lumbar interbody Concorde Bullett cage 36mm at L2-L3. Insertion of lumbar hooks L2-L3 on right, and insertion thoracic hooks L2-L3 on left. (N/A)  Advance diet Up with therapy D/C IV fluids Plan for discharge tomorrow if incision remains stable and progresses with PT/OT.  NITKA,JAMES E 08/12/2014, 7:16 AM

## 2014-08-13 DIAGNOSIS — K567 Ileus, unspecified: Secondary | ICD-10-CM | POA: Diagnosis not present

## 2014-08-13 DIAGNOSIS — K9189 Other postprocedural complications and disorders of digestive system: Secondary | ICD-10-CM

## 2014-08-13 LAB — HEMOGLOBIN A1C
Hgb A1c MFr Bld: 5.9 % — ABNORMAL HIGH (ref 4.8–5.6)
Mean Plasma Glucose: 123 mg/dL

## 2014-08-13 NOTE — Discharge Planning (Signed)
Patient discharged home in stable condition. Verbalizes understanding of all discharge instructions, including home medications and follow up appointments. 

## 2014-08-13 NOTE — Progress Notes (Addendum)
Patient ID: [name], female   DOB: Aug 13, 1955, 59 y.o.   MRN: [id number]     Subjective: 4 Days Post-Op Procedure(s) (LRB): Re-do transforaminal lumbar interbody fusion L2-3 with  vivigen, cancellous allograft and local bone graft, Extension of hardware to L1-2 with posterior fusion L1-2 with removal of left L2 pedicle screw and rod. Insertion of  transforaminal lumbar interbody Concorde Bullett cage 44mm at L2-L3. Insertion of lumbar hooks L2-L3 on right, and insertion thoracic hooks L2-L3 on left. (N/A)Awake, alert and oriented x 4. No pain, all medications are causing nausea, I take alleve once in a while, too much bothers my stomach also.Voiding Without difficulty since foley d/c. Taking and tolerating po meds and diet other than analgesics.  Patient reports pain as mild.    Objective:   VITALS:  Temp:  [98.9 F (37.2 C)-99 F (37.2 C)] 98.9 F (37.2 C) (05/20 0455) Pulse Rate:  [77-106] 99 (05/20 0455) Resp:  [16-18] 16 (05/20 0455) BP: (102-116)/(67-70) 114/68 mmHg (05/20 0455) SpO2:  [93 %-96 %] 96 % (05/20 0455)  Neurologically intact ABD soft Neurovascular intact Sensation intact distally Intact pulses distally Dorsiflexion/Plantar flexion intact Incision: no drainage No cellulitis present   LABS  Recent Labs  08/11/14 0845  HGB 11.7*  WBC 14.2*  PLT 215    Recent Labs  08/11/14 0845  NA 132*  K 3.5  CL 100*  CO2 24  BUN 6  CREATININE 0.77  GLUCOSE 166*   No results for input(s): LABPT, INR in the last 72 hours.   Assessment/Plan: 4 Days Post-Op Procedure(s) (LRB): Re-do transforaminal lumbar interbody fusion L2-3 with  vivigen, cancellous allograft and local bone graft, Extension of hardware to L1-2 with posterior fusion L1-2 with removal of left L2 pedicle screw and rod. Insertion of  transforaminal lumbar interbody Concorde Bullett cage 82mm at L2-L3. Insertion of lumbar hooks L2-L3 on right, and insertion thoracic hooks L2-L3 on left.  (N/A)  Ileus post op resolved. Urinary retention resolved.  Advance diet Up with therapy Discharge home with home health  She has adequate medications for nausea and muscle relaxation at home, no prescriptions at discharge.  Perfecto Purdy E 08/13/2014, 7:41 AM

## 2014-08-13 NOTE — Discharge Summary (Signed)
Physician Discharge Summary      Patient ID: [name] MRN: [id number] DOB/AGE: 60-May-1957 60 y.o.  Admit date: 08/09/2014 Discharge date:   Admission Diagnoses:  Principal Problem:   Pseudarthrosis after fusion or arthrodesis Active Problems:   Painful orthopaedic hardware   Ileus, postoperative   Discharge Diagnoses:  Same  Past Medical History  Diagnosis Date  . Hypertension   . Asthma   . Cold     recent  rx  . GERD (gastroesophageal reflux disease)   . H/O hiatal hernia   . Arthritis   . Cancer     cervical , skin  . Shortness of breath   . Sleep apnea     ' mild " does not use cpap  . Fibromyalgia   . PONV (postoperative nausea and vomiting)     Surgeries: Procedure(s): Re-do transforaminal lumbar interbody fusion L2-3 with  vivigen, cancellous allograft and local bone graft, Extension of hardware to L1-2 with posterior fusion L1-2 with removal of left L2 pedicle screw and rod. Insertion of  transforaminal lumbar interbody Concorde Bullett cage 33mm at L2-L3. Insertion of lumbar hooks L2-L3 on right, and insertion thoracic hooks L2-L3 on left. on 08/09/2014   Consultants:    Discharged Condition: Improved  Hospital Course: CHANA LINDSTROM is an 59 y.o. female who was admitted 08/09/2014 with a chief complaint of No chief complaint on file. , and found to have a diagnosis of Pseudarthrosis after fusion or arthrodesis.  She was brought to the operating room on 08/09/2014 and underwent the above named procedures.    She was given perioperative antibiotics:  Anti-infectives    Start     Dose/Rate Route Frequency Ordered Stop   08/09/14 2000  ceFAZolin (ANCEF) IVPB 1 g/50 mL premix     1 g 100 mL/hr over 30 Minutes Intravenous Every 8 hours 08/09/14 1511 08/10/14 0450   08/09/14 0607  ceFAZolin (ANCEF) IVPB 2 g/50 mL premix     2 g 100 mL/hr over 30 Minutes Intravenous On call to O.R. 08/09/14 0607 08/09/14 1200    POD# 1 and #2 brace obtained for  when she was to be OOB. She experienced headaches and minimal nuchal signs, photophobia, which resolved, she was maintained at bedrest, foley catheter was removed POD#1 but was reinserted that evening due to urinary retention. abdomen distension and nausea and vomiting worsened on POD#2. She had IVF continued, clear liquid diet, did not tolerate any po or transdermal narcotic medication due to nausea and emesis side effect. Abdomenal portable  xray showed ileus. POD#3 head ache was gone, foley was discontinued and PT and OT initiated. She did well with Therapy. She was tolerating brace while out of bed. POD#4 she was alert and oriented x 4, tolerating regular diet. Dressing changed showed mild swelling of incision with minimal fluctuance, likely hematoma vs seroma. No nuchal Signs. She was discharged home on POD#4. Her leg pain present preoperatively resolved with good strength in  Both legs. HgA1c found to be minimally elevated, dietary avoidance of sugar recommended.  She was given sequential compression devices then ambulation for DVT prophylaxis.  They benefited maximally from their hospital stay and there were no complications.    Recent vital signs:  Filed Vitals:   08/13/14 0455  BP: 114/68  Pulse: 99  Temp: 98.9 F (37.2 C)  Resp: 16    Recent laboratory studies:  Results for orders placed or performed during the hospital encounter of 08/09/14  Culture, Urine  Result Value Ref Range   Specimen Description URINE, CATHETERIZED    Special Requests NONE    Colony Count NO GROWTH Performed at [facility]     Culture NO GROWTH Performed at [facility]     Report Status 08/11/2014 FINAL   CBC  Result Value Ref Range   WBC 14.1 (H) 4.0 - 10.5 K/uL   RBC 4.21 3.87 - 5.11 MIL/uL   Hemoglobin 11.8 (L) 12.0 - 15.0 g/dL   HCT 35.5 (L) 36.0 - 46.0 %   MCV 84.3 78.0 - 100.0 fL   MCH 28.0 26.0 - 34.0 pg   MCHC 33.2 30.0 - 36.0 g/dL   RDW 13.6 11.5 - 15.5 %    Platelets 246 150 - 400 K/uL  Basic Metabolic Panel  Result Value Ref Range   Sodium 140 135 - 145 mmol/L   Potassium 4.0 3.5 - 5.1 mmol/L   Chloride 106 101 - 111 mmol/L   CO2 26 22 - 32 mmol/L   Glucose, Bld 133 (H) 65 - 99 mg/dL   BUN 6 6 - 20 mg/dL   Creatinine, Ser 0.88 0.44 - 1.00 mg/dL   Calcium 8.6 (L) 8.9 - 10.3 mg/dL   GFR calc non Af Amer >60 >60 mL/min   GFR calc Af Amer >60 >60 mL/min   Anion gap 8 5 - 15  Urinalysis, Routine w reflex microscopic  Result Value Ref Range   Color, Urine YELLOW YELLOW   APPearance CLEAR CLEAR   Specific Gravity, Urine 1.005 1.005 - 1.030   pH 6.0 5.0 - 8.0   Glucose, UA NEGATIVE NEGATIVE mg/dL   Hgb urine dipstick NEGATIVE NEGATIVE   Bilirubin Urine NEGATIVE NEGATIVE   Ketones, ur NEGATIVE NEGATIVE mg/dL   Protein, ur NEGATIVE NEGATIVE mg/dL   Urobilinogen, UA 0.2 0.0 - 1.0 mg/dL   Nitrite NEGATIVE NEGATIVE   Leukocytes, UA NEGATIVE NEGATIVE  CBC with Differential/Platelet  Result Value Ref Range   WBC DUPLICATE REQUEST  SEE [id number] 4.0 - 47.8 K/uL   RBC DUPLICATE REQUEST  SEE [id number] 3.87 - 5.11 MIL/uL   Hemoglobin DUPLICATE REQUEST  SEE [id number] 12.0 - 57.8 g/dL   HCT DUPLICATE REQUEST  SEE [id number] 36.0 - 95.2 %   MCV DUPLICATE REQUEST  SEE [id number] 78.0 - 440.1 fL   MCH DUPLICATE REQUEST  SEE [id number] 26.0 - 36.6 pg   MCHC DUPLICATE REQUEST  SEE [id number] 30.0 - 74.2 g/dL   RDW DUPLICATE REQUEST  SEE [id number] 11.5 - 15.5 %   Platelets DUPLICATE REQUEST  SEE [id number] 150 - 400 K/uL   Neutrophils Relative % 85 (H) 43 - 77 %   Neutro Abs 12.1 (H) 1.7 - 7.7 K/uL   Lymphocytes Relative 8 (L) 12 - 46 %   Lymphs Abs 1.2 0.7 - 4.0 K/uL   Monocytes Relative 7 3 - 12 %   Monocytes Absolute 1.0 0.1 - 1.0 K/uL   Eosinophils Relative 0 0 - 5 %   Eosinophils Absolute 0.0 0.0 - 0.7 K/uL   Basophils Relative 0 0 - 1 %   Basophils Absolute 0.0 0.0 - 0.1 K/uL  CBC with Differential/Platelet  Result Value Ref Range   WBC 14.2 (H) 4.0 - 10.5 K/uL   RBC 4.08 3.87  - 5.11 MIL/uL   Hemoglobin 11.7 (L) 12.0 - 15.0 g/dL   HCT 34.7 (L) 36.0 - 46.0 %   MCV 85.0 78.0 - 100.0 fL   MCH 28.7 26.0 - 34.0 pg  MCHC 33.7 30.0 - 36.0 g/dL   RDW 13.5 11.5 - 15.5 %   Platelets 215 150 - 400 K/uL   Neutrophils Relative % 79 (H) 43 - 77 %   Neutro Abs 11.2 (H) 1.7 - 7.7 K/uL   Lymphocytes Relative 10 (L) 12 - 46 %   Lymphs Abs 1.5 0.7 - 4.0 K/uL   Monocytes Relative 11 3 - 12 %   Monocytes Absolute 1.5 (H) 0.1 - 1.0 K/uL   Eosinophils Relative 0 0 - 5 %   Eosinophils Absolute 0.0 0.0 - 0.7 K/uL   Basophils Relative 0 0 - 1 %   Basophils Absolute 0.0 0.0 - 0.1 K/uL  Basic metabolic panel  Result Value Ref Range   Sodium 132 (L) 135 - 145 mmol/L   Potassium 3.5 3.5 - 5.1 mmol/L   Chloride 100 (L) 101 - 111 mmol/L   CO2 24 22 - 32 mmol/L   Glucose, Bld 166 (H) 65 - 99 mg/dL   BUN 6 6 - 20 mg/dL   Creatinine, Ser 0.77 0.44 - 1.00 mg/dL   Calcium 7.8 (L) 8.9 - 10.3 mg/dL   GFR calc non Af Amer >60 >60 mL/min   GFR calc Af Amer >60 >60 mL/min   Anion gap 8 5 - 15  Hemoglobin A1c  Result Value Ref Range   Hgb A1c MFr Bld 5.9 (H) 4.8 - 5.6 %   Mean Plasma Glucose 123 mg/dL    Discharge Medications:     Medication List    TAKE these medications        clonazePAM 0.5 MG tablet  Commonly known as:  KLONOPIN  Take 0.5 mg by mouth at bedtime.     diltiazem 240 MG 24 hr capsule  Commonly known as:  CARDIZEM CD  Take 240 mg by mouth daily.     diphenhydrAMINE 25 mg capsule  Commonly known as:  BENADRYL  Take 1 capsule (25 mg total) by mouth every 8 (eight) hours as needed for itching.     estradiol 0.5 MG tablet  Commonly known as:  ESTRACE  Take 0.5 mg by mouth daily.     fentaNYL 25 MCG/HR patch  Commonly known as:  DURAGESIC - dosed mcg/hr  Place 1 patch (25 mcg total) onto the skin every 3 (three) days.     fluticasone 110 MCG/ACT inhaler  Commonly known as:  FLOVENT HFA  Inhale 1 puff into the lungs as needed.     lidocaine 5 %    Commonly known as:  LIDODERM  Place 1 patch onto the skin daily. Remove & Discard patch within 12 hours or as directed by MD     loratadine 10 MG tablet  Commonly known as:  CLARITIN  Take 10 mg by mouth daily as needed for allergies.     meloxicam 15 MG tablet  Commonly known as:  MOBIC  Take 15 mg by mouth daily.     methocarbamol 500 MG tablet  Commonly known as:  ROBAXIN  Take 1 tablet (500 mg total) by mouth 3 (three) times daily.     ondansetron 4 MG tablet  Commonly known as:  ZOFRAN  Take 1 tablet (4 mg total) by mouth every 8 (eight) hours as needed for nausea or vomiting.     pantoprazole 40 MG tablet  Commonly known as:  PROTONIX  Take 40 mg by mouth 2 (two) times daily.     polyethylene glycol packet  Commonly known as:  MIRALAX / GLYCOLAX  Take 17 g by mouth daily.     promethazine 25 MG tablet  Commonly known as:  PHENERGAN  Take 25 mg by mouth. Take 1/2 tablet Q 4 hours as needed     senna-docusate 8.6-50 MG per tablet  Commonly known as:  Senokot-S  Take 1 tablet by mouth at bedtime.     traMADol 50 MG tablet  Commonly known as:  ULTRAM  Take 1 tablet (50 mg total) by mouth every 6 (six) hours as needed for moderate pain (For break through pain).        Diagnostic Studies: Dg Lumbar Spine 2-3 Views  08/09/2014   CLINICAL DATA:  Imaging for spinal fusion surgery.  EXAM: LUMBAR SPINE - 2-3 VIEW; DG C-ARM 61-120 MIN  COMPARISON:  04/07/2013.  FINDINGS: Imaging shows placement of new interconnecting rods from L2 through L4. There are hooks over the lamina of L2 on L3. Previous pedicle screw on the left at L2 has been removed. Remaining fusion hardware from the prior study is in place. Radiolucent disc spacers are well positioned and stable from the prior studies.  There is no acute fracture or evidence of an operative complication.  IMPRESSION: Operative imaging during lumbar spine fusion surgery.   Electronically Signed   By: Lajean Manes M.D.   On:  08/09/2014 12:42   Dg Abd Portable 1v  08/11/2014   CLINICAL DATA:  59 year old female with abdominal pain, nausea and vomiting. Recent lower back surgery.  EXAM: PORTABLE ABDOMEN - 1 VIEW  COMPARISON:  Recent intraoperative images 516 2016; MRI lumbar spine 06/05/2014 ; prior CT abdomen/ pelvis 05/19/2009  FINDINGS: The bowel gas pattern is not obstructed. Very mild gaseous distension of the transverse colon. No large free air on this portable supine view. No organomegaly, abnormal calcification or evidence of ascites. Surgical changes of a multi level posterior lumbar interbody fusion.  IMPRESSION: 1. Very mild gaseous distension of the transverse colon without evidence of obstruction.   Electronically Signed   By: Jacqulynn Cadet M.D.   On: 08/11/2014 08:23   Dg C-arm 1-60 Min  08/09/2014   CLINICAL DATA:  Imaging for spinal fusion surgery.  EXAM: LUMBAR SPINE - 2-3 VIEW; DG C-ARM 61-120 MIN  COMPARISON:  04/07/2013.  FINDINGS: Imaging shows placement of new interconnecting rods from L2 through L4. There are hooks over the lamina of L2 on L3. Previous pedicle screw on the left at L2 has been removed. Remaining fusion hardware from the prior study is in place. Radiolucent disc spacers are well positioned and stable from the prior studies.  There is no acute fracture or evidence of an operative complication.  IMPRESSION: Operative imaging during lumbar spine fusion surgery.   Electronically Signed   By: Lajean Manes M.D.   On: 08/09/2014 12:42    Disposition: 06-Home-Health Care Svc      Discharge Instructions    Call MD / Call 911    Complete by:  As directed   If you experience chest pain or shortness of breath, CALL 911 and be transported to the hospital emergency room.  If you develope a fever above 101 F, pus (white drainage) or increased drainage or redness at the wound, or calf pain, call your surgeon's office.     Constipation Prevention    Complete by:  As directed   Drink plenty of  fluids.  Prune juice may be helpful.  You may use a stool softener, such as Colace (over the  counter) 100 mg twice a day.  Use MiraLax (over the counter) for constipation as needed.     Diet - low sodium heart healthy    Complete by:  As directed      Discharge instructions    Complete by:  As directed   Call if there is increasing drainage, fever greater than 101.5, severe head aches, and worsening nausea or light sensitivity. If shortness of breath, bloody cough or chest tightness or pain go to an emergency room. No lifting greater than 10 lbs. Avoid bending, stooping and twisting. Use brace when sitting and out of bed even to go to bathroom. Walk in house for first 2 weeks then may start to get out slowly increasing distances up to one half mile by 4-6 weeks post op. After 5 days may shower and change dressing following bathing with shower.When bathing remove the brace shower and replace brace before getting out of the shower. If drainage, keep dry dressing and do not bathe the incision, use an moisture impervious dressing. Please call and return for scheduled follow up appointment 2 weeks from the time of surgery.     Driving restrictions    Complete by:  As directed   No driving for 3 weeks     Increase activity slowly as tolerated    Complete by:  As directed      Lifting restrictions    Complete by:  As directed   No lifting for 8 weeks           Follow-up Information    Follow up with NITKA,JAMES E, MD In 2 weeks.   Specialty:  Orthopedic Surgery   Contact information:   [place] [address] [phone]        Signed: Jessy Oto 08/13/2014, 7:47 AM

## 2014-08-26 ENCOUNTER — Encounter (HOSPITAL_COMMUNITY): Payer: Self-pay | Admitting: Specialist

## 2014-08-26 DIAGNOSIS — M4808 Spinal stenosis, sacral and sacrococcygeal region: Secondary | ICD-10-CM | POA: Diagnosis not present

## 2014-08-26 DIAGNOSIS — M47817 Spondylosis without myelopathy or radiculopathy, lumbosacral region: Secondary | ICD-10-CM | POA: Diagnosis not present

## 2014-08-27 ENCOUNTER — Encounter (HOSPITAL_COMMUNITY): Payer: Self-pay | Admitting: Specialist

## 2014-08-30 DIAGNOSIS — R600 Localized edema: Secondary | ICD-10-CM | POA: Diagnosis not present

## 2014-08-30 DIAGNOSIS — M10071 Idiopathic gout, right ankle and foot: Secondary | ICD-10-CM | POA: Diagnosis not present

## 2014-08-31 DIAGNOSIS — M79661 Pain in right lower leg: Secondary | ICD-10-CM | POA: Diagnosis not present

## 2014-08-31 DIAGNOSIS — M79604 Pain in right leg: Secondary | ICD-10-CM | POA: Diagnosis not present

## 2014-08-31 DIAGNOSIS — M7989 Other specified soft tissue disorders: Secondary | ICD-10-CM | POA: Diagnosis not present

## 2014-08-31 DIAGNOSIS — Z9889 Other specified postprocedural states: Secondary | ICD-10-CM | POA: Diagnosis not present

## 2014-08-31 DIAGNOSIS — R2241 Localized swelling, mass and lump, right lower limb: Secondary | ICD-10-CM | POA: Diagnosis not present

## 2014-09-15 DIAGNOSIS — J019 Acute sinusitis, unspecified: Secondary | ICD-10-CM | POA: Diagnosis not present

## 2014-10-07 DIAGNOSIS — J019 Acute sinusitis, unspecified: Secondary | ICD-10-CM | POA: Diagnosis not present

## 2014-10-18 DIAGNOSIS — E782 Mixed hyperlipidemia: Secondary | ICD-10-CM | POA: Diagnosis not present

## 2014-10-18 DIAGNOSIS — F324 Major depressive disorder, single episode, in partial remission: Secondary | ICD-10-CM | POA: Diagnosis not present

## 2014-10-18 DIAGNOSIS — I10 Essential (primary) hypertension: Secondary | ICD-10-CM | POA: Diagnosis not present

## 2014-10-26 DIAGNOSIS — K219 Gastro-esophageal reflux disease without esophagitis: Secondary | ICD-10-CM | POA: Diagnosis not present

## 2014-10-26 DIAGNOSIS — J301 Allergic rhinitis due to pollen: Secondary | ICD-10-CM | POA: Diagnosis not present

## 2014-10-26 DIAGNOSIS — M5032 Other cervical disc degeneration, mid-cervical region: Secondary | ICD-10-CM | POA: Diagnosis not present

## 2014-10-26 DIAGNOSIS — F331 Major depressive disorder, recurrent, moderate: Secondary | ICD-10-CM | POA: Diagnosis not present

## 2014-10-26 DIAGNOSIS — J0101 Acute recurrent maxillary sinusitis: Secondary | ICD-10-CM | POA: Diagnosis not present

## 2014-10-26 DIAGNOSIS — M797 Fibromyalgia: Secondary | ICD-10-CM | POA: Diagnosis not present

## 2014-11-04 DIAGNOSIS — M545 Low back pain: Secondary | ICD-10-CM | POA: Diagnosis not present

## 2014-11-08 DIAGNOSIS — M545 Low back pain: Secondary | ICD-10-CM | POA: Diagnosis not present

## 2014-11-08 DIAGNOSIS — M797 Fibromyalgia: Secondary | ICD-10-CM | POA: Diagnosis not present

## 2014-12-15 DIAGNOSIS — Z23 Encounter for immunization: Secondary | ICD-10-CM | POA: Diagnosis not present

## 2014-12-16 DIAGNOSIS — M47817 Spondylosis without myelopathy or radiculopathy, lumbosacral region: Secondary | ICD-10-CM | POA: Diagnosis not present

## 2015-01-04 DIAGNOSIS — R05 Cough: Secondary | ICD-10-CM | POA: Diagnosis not present

## 2015-01-04 DIAGNOSIS — J019 Acute sinusitis, unspecified: Secondary | ICD-10-CM | POA: Diagnosis not present

## 2015-01-04 DIAGNOSIS — K219 Gastro-esophageal reflux disease without esophagitis: Secondary | ICD-10-CM | POA: Diagnosis not present

## 2015-01-04 DIAGNOSIS — R0789 Other chest pain: Secondary | ICD-10-CM | POA: Diagnosis not present

## 2015-01-17 DIAGNOSIS — R05 Cough: Secondary | ICD-10-CM | POA: Diagnosis not present

## 2015-01-17 DIAGNOSIS — K219 Gastro-esophageal reflux disease without esophagitis: Secondary | ICD-10-CM | POA: Diagnosis not present

## 2015-01-17 DIAGNOSIS — J0101 Acute recurrent maxillary sinusitis: Secondary | ICD-10-CM | POA: Diagnosis not present

## 2015-01-19 ENCOUNTER — Encounter: Payer: Self-pay | Admitting: *Deleted

## 2015-01-31 DIAGNOSIS — M797 Fibromyalgia: Secondary | ICD-10-CM | POA: Diagnosis not present

## 2015-03-10 DIAGNOSIS — E782 Mixed hyperlipidemia: Secondary | ICD-10-CM | POA: Diagnosis not present

## 2015-03-10 DIAGNOSIS — I10 Essential (primary) hypertension: Secondary | ICD-10-CM | POA: Diagnosis not present

## 2015-03-10 DIAGNOSIS — K219 Gastro-esophageal reflux disease without esophagitis: Secondary | ICD-10-CM | POA: Diagnosis not present

## 2015-03-17 DIAGNOSIS — I10 Essential (primary) hypertension: Secondary | ICD-10-CM | POA: Diagnosis not present

## 2015-03-17 DIAGNOSIS — F331 Major depressive disorder, recurrent, moderate: Secondary | ICD-10-CM | POA: Diagnosis not present

## 2015-03-17 DIAGNOSIS — Z0001 Encounter for general adult medical examination with abnormal findings: Secondary | ICD-10-CM | POA: Diagnosis not present

## 2015-03-17 DIAGNOSIS — J301 Allergic rhinitis due to pollen: Secondary | ICD-10-CM | POA: Diagnosis not present

## 2015-03-17 DIAGNOSIS — E782 Mixed hyperlipidemia: Secondary | ICD-10-CM | POA: Diagnosis not present

## 2015-03-18 DIAGNOSIS — M797 Fibromyalgia: Secondary | ICD-10-CM | POA: Diagnosis not present

## 2015-03-18 DIAGNOSIS — M25562 Pain in left knee: Secondary | ICD-10-CM | POA: Diagnosis not present

## 2015-03-18 DIAGNOSIS — M47817 Spondylosis without myelopathy or radiculopathy, lumbosacral region: Secondary | ICD-10-CM | POA: Diagnosis not present

## 2015-03-18 DIAGNOSIS — M545 Low back pain: Secondary | ICD-10-CM | POA: Diagnosis not present

## 2015-03-18 DIAGNOSIS — M4808 Spinal stenosis, sacral and sacrococcygeal region: Secondary | ICD-10-CM | POA: Diagnosis not present

## 2015-04-08 DIAGNOSIS — J209 Acute bronchitis, unspecified: Secondary | ICD-10-CM | POA: Diagnosis not present

## 2015-04-08 DIAGNOSIS — R197 Diarrhea, unspecified: Secondary | ICD-10-CM | POA: Diagnosis not present

## 2015-04-08 DIAGNOSIS — J019 Acute sinusitis, unspecified: Secondary | ICD-10-CM | POA: Diagnosis not present

## 2015-04-18 DIAGNOSIS — Z1231 Encounter for screening mammogram for malignant neoplasm of breast: Secondary | ICD-10-CM | POA: Diagnosis not present

## 2015-05-11 DIAGNOSIS — M545 Low back pain: Secondary | ICD-10-CM | POA: Diagnosis not present

## 2015-05-11 DIAGNOSIS — M7062 Trochanteric bursitis, left hip: Secondary | ICD-10-CM | POA: Diagnosis not present

## 2015-05-11 DIAGNOSIS — M79672 Pain in left foot: Secondary | ICD-10-CM | POA: Diagnosis not present

## 2015-05-12 ENCOUNTER — Other Ambulatory Visit: Payer: Self-pay | Admitting: Specialist

## 2015-05-12 DIAGNOSIS — M545 Low back pain: Secondary | ICD-10-CM

## 2015-05-17 ENCOUNTER — Ambulatory Visit
Admission: RE | Admit: 2015-05-17 | Discharge: 2015-05-17 | Disposition: A | Discharge status: Home / Self Care | Payer: Medicare Other | Source: Ambulatory Visit | Attending: Specialist | Admitting: Specialist

## 2015-05-17 DIAGNOSIS — M545 Low back pain: Secondary | ICD-10-CM

## 2015-05-17 DIAGNOSIS — M4326 Fusion of spine, lumbar region: Secondary | ICD-10-CM | POA: Diagnosis not present

## 2015-05-20 DIAGNOSIS — R202 Paresthesia of skin: Secondary | ICD-10-CM | POA: Diagnosis not present

## 2015-06-16 DIAGNOSIS — M47817 Spondylosis without myelopathy or radiculopathy, lumbosacral region: Secondary | ICD-10-CM | POA: Diagnosis not present

## 2015-06-20 DIAGNOSIS — J019 Acute sinusitis, unspecified: Secondary | ICD-10-CM | POA: Diagnosis not present

## 2015-06-20 DIAGNOSIS — J209 Acute bronchitis, unspecified: Secondary | ICD-10-CM | POA: Diagnosis not present

## 2015-06-23 DIAGNOSIS — M5416 Radiculopathy, lumbar region: Secondary | ICD-10-CM | POA: Diagnosis not present

## 2015-06-23 DIAGNOSIS — M961 Postlaminectomy syndrome, not elsewhere classified: Secondary | ICD-10-CM | POA: Diagnosis not present

## 2015-07-07 DIAGNOSIS — I10 Essential (primary) hypertension: Secondary | ICD-10-CM | POA: Diagnosis not present

## 2015-07-07 DIAGNOSIS — M797 Fibromyalgia: Secondary | ICD-10-CM | POA: Diagnosis not present

## 2015-07-07 DIAGNOSIS — E782 Mixed hyperlipidemia: Secondary | ICD-10-CM | POA: Diagnosis not present

## 2015-07-07 DIAGNOSIS — K219 Gastro-esophageal reflux disease without esophagitis: Secondary | ICD-10-CM | POA: Diagnosis not present

## 2015-07-07 DIAGNOSIS — N183 Chronic kidney disease, stage 3 (moderate): Secondary | ICD-10-CM | POA: Diagnosis not present

## 2015-07-14 DIAGNOSIS — E782 Mixed hyperlipidemia: Secondary | ICD-10-CM | POA: Diagnosis not present

## 2015-07-14 DIAGNOSIS — K219 Gastro-esophageal reflux disease without esophagitis: Secondary | ICD-10-CM | POA: Diagnosis not present

## 2015-07-14 DIAGNOSIS — Z1389 Encounter for screening for other disorder: Secondary | ICD-10-CM | POA: Diagnosis not present

## 2015-07-14 DIAGNOSIS — I10 Essential (primary) hypertension: Secondary | ICD-10-CM | POA: Diagnosis not present

## 2015-07-14 DIAGNOSIS — J301 Allergic rhinitis due to pollen: Secondary | ICD-10-CM | POA: Diagnosis not present

## 2015-07-14 DIAGNOSIS — J0100 Acute maxillary sinusitis, unspecified: Secondary | ICD-10-CM | POA: Diagnosis not present

## 2015-07-14 DIAGNOSIS — M7062 Trochanteric bursitis, left hip: Secondary | ICD-10-CM | POA: Diagnosis not present

## 2015-07-28 DIAGNOSIS — M1711 Unilateral primary osteoarthritis, right knee: Secondary | ICD-10-CM | POA: Diagnosis not present

## 2015-07-28 DIAGNOSIS — M47817 Spondylosis without myelopathy or radiculopathy, lumbosacral region: Secondary | ICD-10-CM | POA: Diagnosis not present

## 2015-07-28 DIAGNOSIS — M5416 Radiculopathy, lumbar region: Secondary | ICD-10-CM | POA: Diagnosis not present

## 2015-07-28 DIAGNOSIS — M1712 Unilateral primary osteoarthritis, left knee: Secondary | ICD-10-CM | POA: Diagnosis not present

## 2015-09-06 DIAGNOSIS — M1712 Unilateral primary osteoarthritis, left knee: Secondary | ICD-10-CM | POA: Diagnosis not present

## 2015-09-06 DIAGNOSIS — J0101 Acute recurrent maxillary sinusitis: Secondary | ICD-10-CM | POA: Diagnosis not present

## 2015-10-17 DIAGNOSIS — J019 Acute sinusitis, unspecified: Secondary | ICD-10-CM | POA: Diagnosis not present

## 2015-10-17 DIAGNOSIS — R05 Cough: Secondary | ICD-10-CM | POA: Diagnosis not present

## 2015-10-24 DIAGNOSIS — J069 Acute upper respiratory infection, unspecified: Secondary | ICD-10-CM | POA: Diagnosis not present

## 2015-10-24 DIAGNOSIS — J019 Acute sinusitis, unspecified: Secondary | ICD-10-CM | POA: Diagnosis not present

## 2015-11-09 DIAGNOSIS — N183 Chronic kidney disease, stage 3 (moderate): Secondary | ICD-10-CM | POA: Diagnosis not present

## 2015-11-09 DIAGNOSIS — J0101 Acute recurrent maxillary sinusitis: Secondary | ICD-10-CM | POA: Diagnosis not present

## 2015-11-09 DIAGNOSIS — E782 Mixed hyperlipidemia: Secondary | ICD-10-CM | POA: Diagnosis not present

## 2015-11-09 DIAGNOSIS — J301 Allergic rhinitis due to pollen: Secondary | ICD-10-CM | POA: Diagnosis not present

## 2015-11-09 DIAGNOSIS — K219 Gastro-esophageal reflux disease without esophagitis: Secondary | ICD-10-CM | POA: Diagnosis not present

## 2015-11-09 DIAGNOSIS — I10 Essential (primary) hypertension: Secondary | ICD-10-CM | POA: Diagnosis not present

## 2015-11-14 DIAGNOSIS — K219 Gastro-esophageal reflux disease without esophagitis: Secondary | ICD-10-CM | POA: Diagnosis not present

## 2015-11-14 DIAGNOSIS — J301 Allergic rhinitis due to pollen: Secondary | ICD-10-CM | POA: Diagnosis not present

## 2015-11-14 DIAGNOSIS — I10 Essential (primary) hypertension: Secondary | ICD-10-CM | POA: Diagnosis not present

## 2015-11-14 DIAGNOSIS — E782 Mixed hyperlipidemia: Secondary | ICD-10-CM | POA: Diagnosis not present

## 2015-12-07 DIAGNOSIS — M1711 Unilateral primary osteoarthritis, right knee: Secondary | ICD-10-CM | POA: Diagnosis not present

## 2015-12-07 DIAGNOSIS — Z23 Encounter for immunization: Secondary | ICD-10-CM | POA: Diagnosis not present

## 2015-12-08 ENCOUNTER — Ambulatory Visit (INDEPENDENT_AMBULATORY_CARE_PROVIDER_SITE_OTHER): Payer: Medicare Other | Admitting: Otolaryngology

## 2015-12-08 DIAGNOSIS — J342 Deviated nasal septum: Secondary | ICD-10-CM

## 2015-12-08 DIAGNOSIS — J310 Chronic rhinitis: Secondary | ICD-10-CM | POA: Diagnosis not present

## 2015-12-08 DIAGNOSIS — H9042 Sensorineural hearing loss, unilateral, left ear, with unrestricted hearing on the contralateral side: Secondary | ICD-10-CM

## 2015-12-08 DIAGNOSIS — J343 Hypertrophy of nasal turbinates: Secondary | ICD-10-CM

## 2016-02-14 DIAGNOSIS — R05 Cough: Secondary | ICD-10-CM | POA: Diagnosis not present

## 2016-02-14 DIAGNOSIS — M7062 Trochanteric bursitis, left hip: Secondary | ICD-10-CM | POA: Diagnosis not present

## 2016-02-14 DIAGNOSIS — N951 Menopausal and female climacteric states: Secondary | ICD-10-CM | POA: Diagnosis not present

## 2016-02-14 DIAGNOSIS — M797 Fibromyalgia: Secondary | ICD-10-CM | POA: Diagnosis not present

## 2016-02-14 DIAGNOSIS — J019 Acute sinusitis, unspecified: Secondary | ICD-10-CM | POA: Diagnosis not present

## 2016-02-15 DIAGNOSIS — M7062 Trochanteric bursitis, left hip: Secondary | ICD-10-CM | POA: Diagnosis not present

## 2016-03-12 DIAGNOSIS — J019 Acute sinusitis, unspecified: Secondary | ICD-10-CM | POA: Diagnosis not present

## 2016-03-12 DIAGNOSIS — M797 Fibromyalgia: Secondary | ICD-10-CM | POA: Diagnosis not present

## 2016-03-12 DIAGNOSIS — K219 Gastro-esophageal reflux disease without esophagitis: Secondary | ICD-10-CM | POA: Diagnosis not present

## 2016-03-20 DIAGNOSIS — K219 Gastro-esophageal reflux disease without esophagitis: Secondary | ICD-10-CM | POA: Diagnosis not present

## 2016-03-20 DIAGNOSIS — I10 Essential (primary) hypertension: Secondary | ICD-10-CM | POA: Diagnosis not present

## 2016-03-20 DIAGNOSIS — E782 Mixed hyperlipidemia: Secondary | ICD-10-CM | POA: Diagnosis not present

## 2016-03-20 DIAGNOSIS — R5383 Other fatigue: Secondary | ICD-10-CM | POA: Diagnosis not present

## 2016-03-20 DIAGNOSIS — N183 Chronic kidney disease, stage 3 (moderate): Secondary | ICD-10-CM | POA: Diagnosis not present

## 2016-03-24 DIAGNOSIS — Z1212 Encounter for screening for malignant neoplasm of rectum: Secondary | ICD-10-CM | POA: Diagnosis not present

## 2016-03-24 DIAGNOSIS — J301 Allergic rhinitis due to pollen: Secondary | ICD-10-CM | POA: Diagnosis not present

## 2016-03-24 DIAGNOSIS — E782 Mixed hyperlipidemia: Secondary | ICD-10-CM | POA: Diagnosis not present

## 2016-03-24 DIAGNOSIS — Z0001 Encounter for general adult medical examination with abnormal findings: Secondary | ICD-10-CM | POA: Diagnosis not present

## 2016-03-24 DIAGNOSIS — I10 Essential (primary) hypertension: Secondary | ICD-10-CM | POA: Diagnosis not present

## 2016-04-20 DIAGNOSIS — Z1231 Encounter for screening mammogram for malignant neoplasm of breast: Secondary | ICD-10-CM | POA: Diagnosis not present

## 2016-05-08 DIAGNOSIS — J209 Acute bronchitis, unspecified: Secondary | ICD-10-CM | POA: Diagnosis not present

## 2016-05-08 DIAGNOSIS — J019 Acute sinusitis, unspecified: Secondary | ICD-10-CM | POA: Diagnosis not present

## 2016-05-08 DIAGNOSIS — R300 Dysuria: Secondary | ICD-10-CM | POA: Diagnosis not present

## 2016-05-29 DIAGNOSIS — Z6824 Body mass index (BMI) 24.0-24.9, adult: Secondary | ICD-10-CM | POA: Diagnosis not present

## 2016-05-29 DIAGNOSIS — K1121 Acute sialoadenitis: Secondary | ICD-10-CM | POA: Diagnosis not present

## 2016-05-29 DIAGNOSIS — K219 Gastro-esophageal reflux disease without esophagitis: Secondary | ICD-10-CM | POA: Diagnosis not present

## 2016-06-15 DIAGNOSIS — Z9189 Other specified personal risk factors, not elsewhere classified: Secondary | ICD-10-CM | POA: Diagnosis not present

## 2016-06-15 DIAGNOSIS — I10 Essential (primary) hypertension: Secondary | ICD-10-CM | POA: Diagnosis not present

## 2016-06-15 DIAGNOSIS — N183 Chronic kidney disease, stage 3 (moderate): Secondary | ICD-10-CM | POA: Diagnosis not present

## 2016-06-15 DIAGNOSIS — K219 Gastro-esophageal reflux disease without esophagitis: Secondary | ICD-10-CM | POA: Diagnosis not present

## 2016-06-15 DIAGNOSIS — E782 Mixed hyperlipidemia: Secondary | ICD-10-CM | POA: Diagnosis not present

## 2016-06-19 DIAGNOSIS — Z23 Encounter for immunization: Secondary | ICD-10-CM | POA: Diagnosis not present

## 2016-06-19 DIAGNOSIS — I10 Essential (primary) hypertension: Secondary | ICD-10-CM | POA: Diagnosis not present

## 2016-06-19 DIAGNOSIS — M545 Low back pain: Secondary | ICD-10-CM | POA: Diagnosis not present

## 2016-06-19 DIAGNOSIS — K219 Gastro-esophageal reflux disease without esophagitis: Secondary | ICD-10-CM | POA: Diagnosis not present

## 2016-06-19 DIAGNOSIS — E782 Mixed hyperlipidemia: Secondary | ICD-10-CM | POA: Diagnosis not present

## 2016-07-24 DIAGNOSIS — J0100 Acute maxillary sinusitis, unspecified: Secondary | ICD-10-CM | POA: Diagnosis not present

## 2016-07-24 DIAGNOSIS — M797 Fibromyalgia: Secondary | ICD-10-CM | POA: Diagnosis not present

## 2016-07-24 DIAGNOSIS — M1712 Unilateral primary osteoarthritis, left knee: Secondary | ICD-10-CM | POA: Diagnosis not present

## 2016-07-24 IMAGING — MR MR LUMBAR SPINE W/O CM
5 series · 44 of 48 positions shown · non-contrast
Comparison: Intraoperative radiographs 04/07/2013. MRI of the
lumbar spine 02/13/2013

CLINICAL DATA: Low back pain. Lumbar fusion L2-S1. Nonunion at
L2-3.

EXAM:
MRI LUMBAR SPINE WITHOUT CONTRAST
TECHNIQUE: Multiplanar, multisequence MR imaging of the lumbar spine was
performed. No intravenous contrast was administered.

[Series 3: T1 · sagittal · 4.0mm · 0.88mm/px · 6 of 13 slices shown (1 of 2)]
[im 1/13]
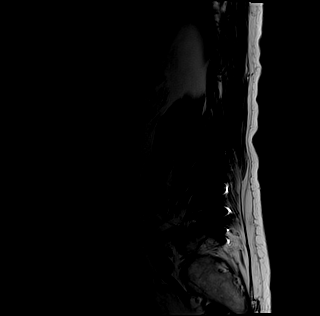
[im 3/13]
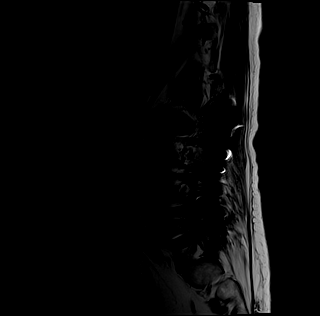
[im 5/13]
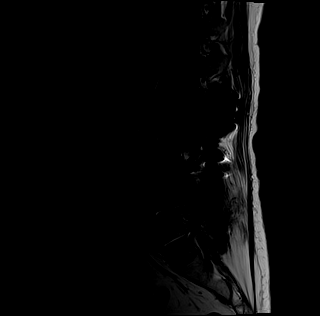
[im 8/13]
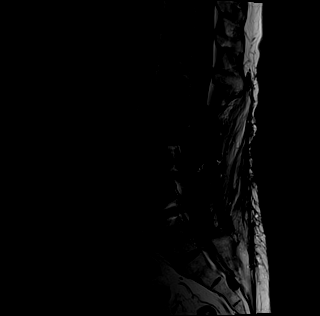
[im 10/13]
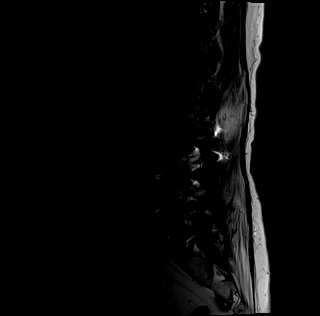
[im 13/13]
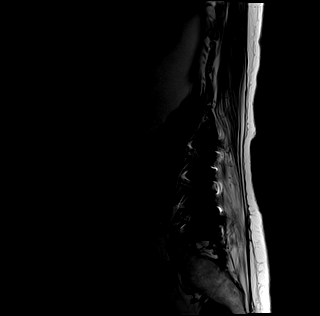

[Series 4: T2 · sagittal · 4.0mm · 0.88mm/px · 6 of 13 slices shown (1 of 2)]
[im 1/13]
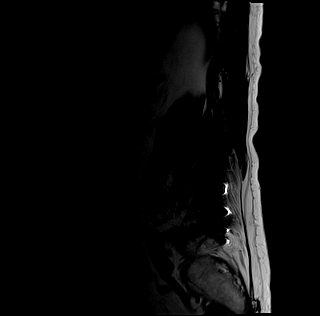
[im 3/13]
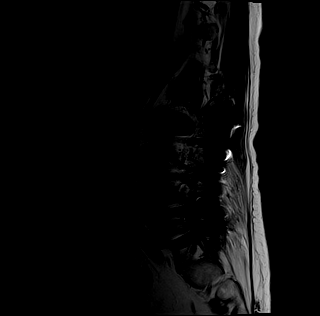
[im 5/13]
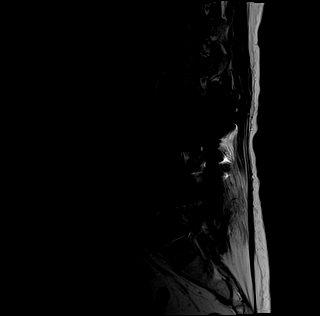
[im 8/13]
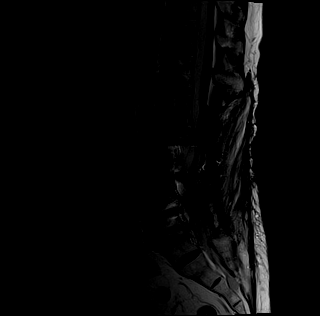
[im 10/13]
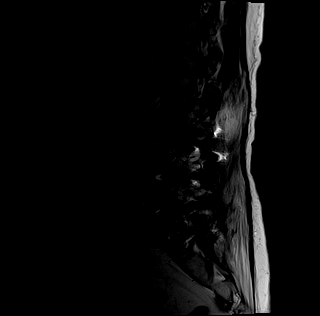
[im 13/13]
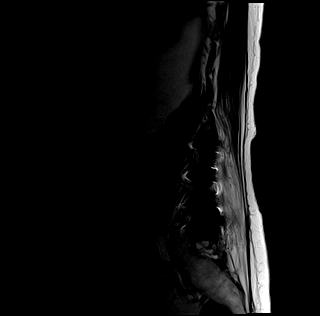

[Series 5: tirm sag · sagittal · 4.0mm · 0.55mm/px · 6 of 13 slices shown]
[im 1/13]
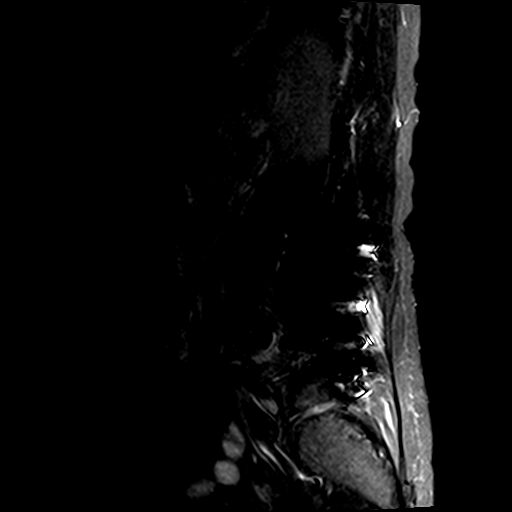
[im 3/13]
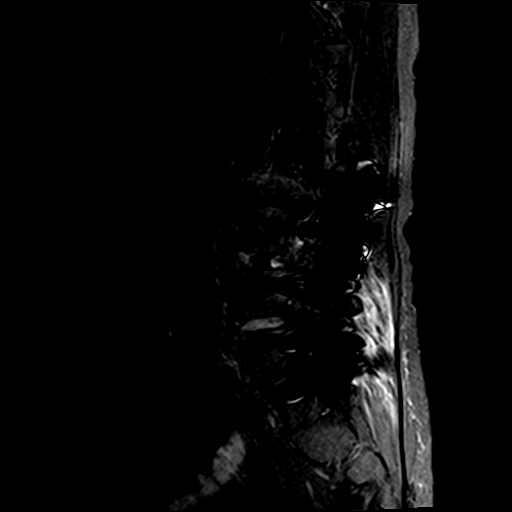
[im 5/13]
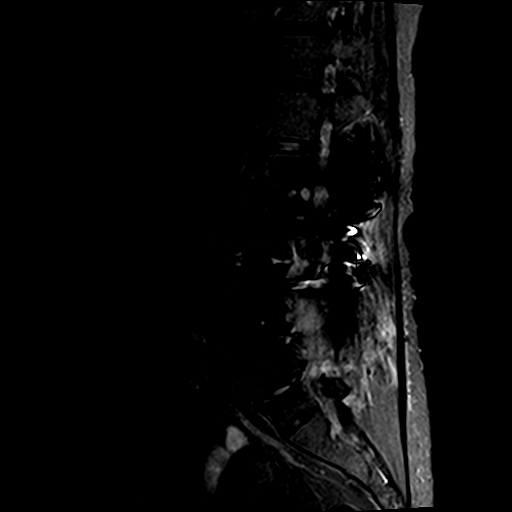
[im 8/13]
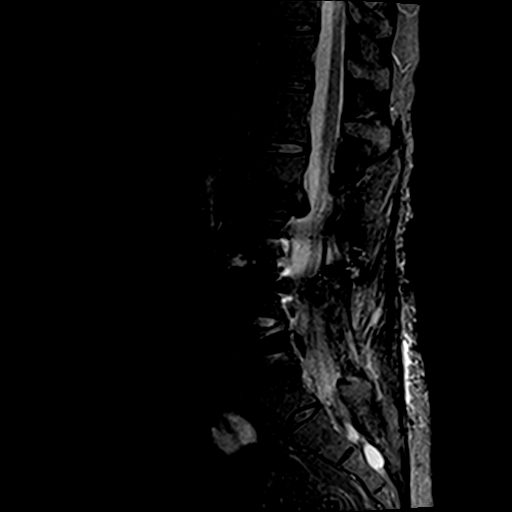
[im 10/13]
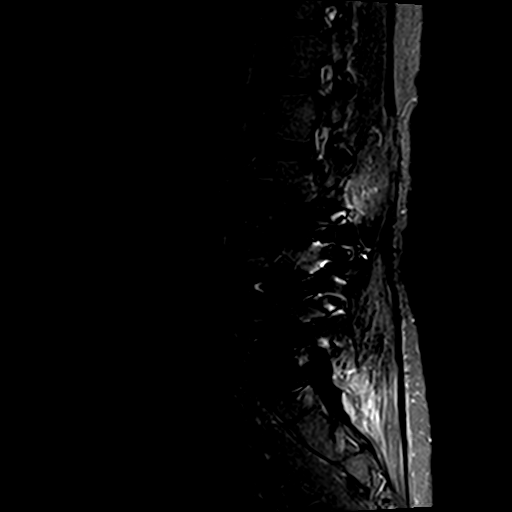
[im 13/13]
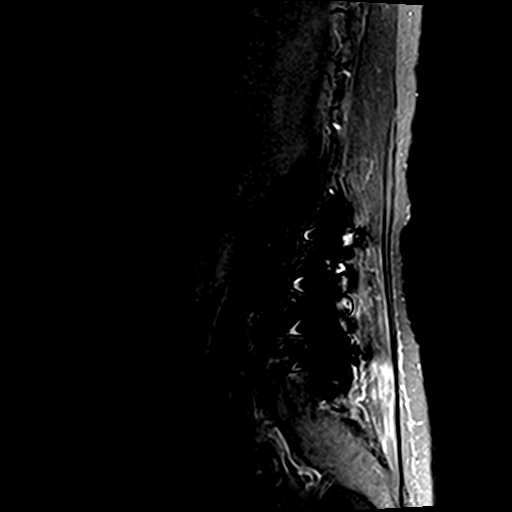

[Series 6: T1 · axial · 4.0mm · 0.70mm/px · z∈[-61,+105]mm · 11 of 33 slices shown (2 of 2)]
[im 1/33]
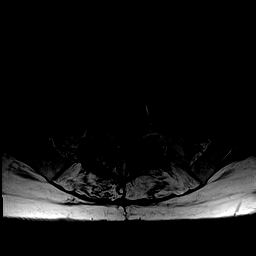
[im 3/33]
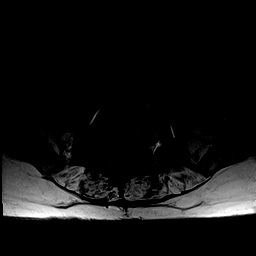
[im 5/33]
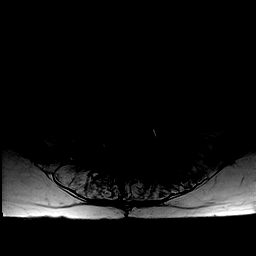
[im 7/33]
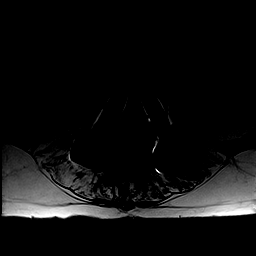
[im 10/33]
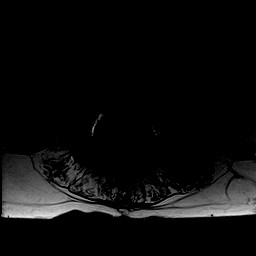
[im 14/33]
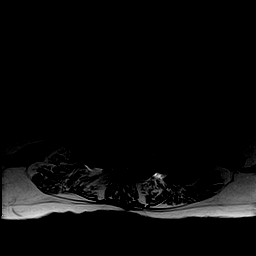
[im 17/33]
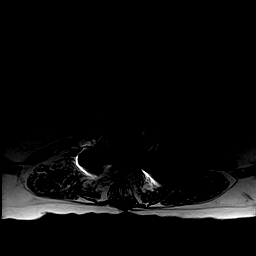
[im 19/33]
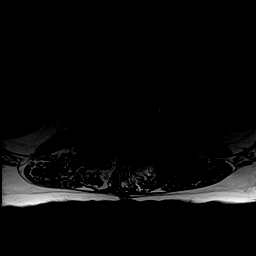
[im 23/33]
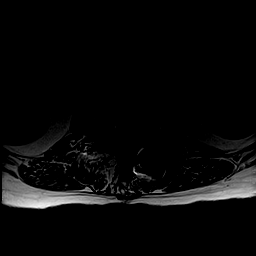
[im 28/33]
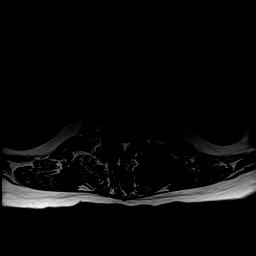
[im 33/33]
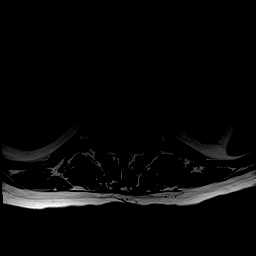

[Series 7: T2 · axial · 4.0mm · 0.70mm/px · z∈[-61,+105]mm · 15 of 33 slices shown (2 of 2)]
[im 1/33]
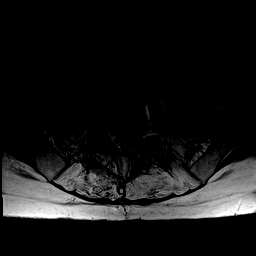
[im 3/33]
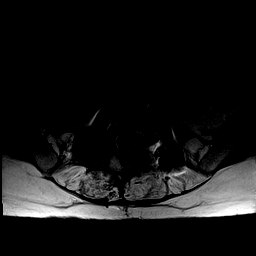
[im 5/33]
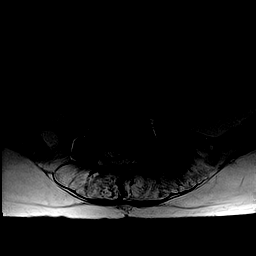
[im 7/33]
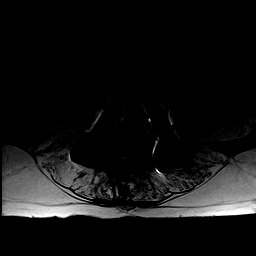
[im 10/33]
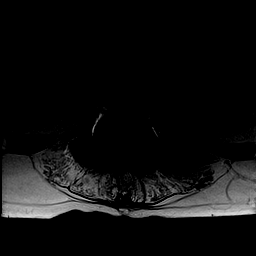
[im 12/33]
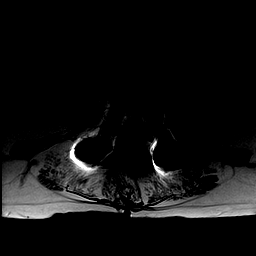
[im 14/33]
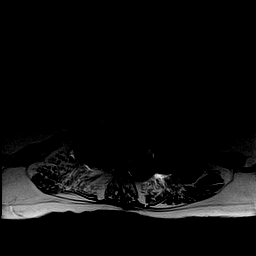
[im 17/33]
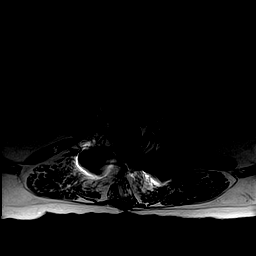
[im 19/33]
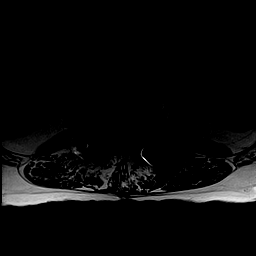
[im 21/33]
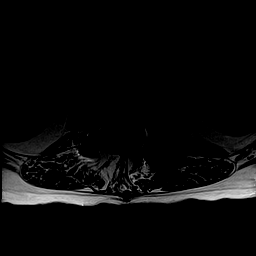
[im 23/33]
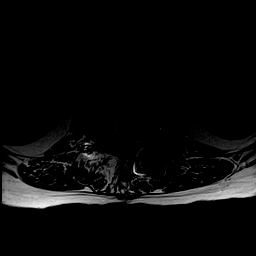
[im 26/33]
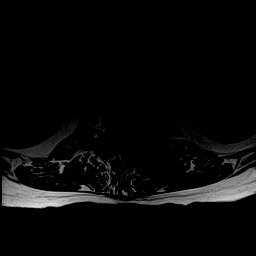
[im 28/33]
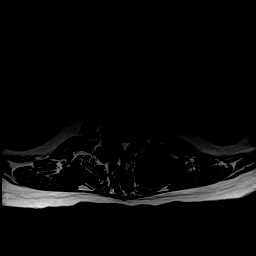
[im 30/33]
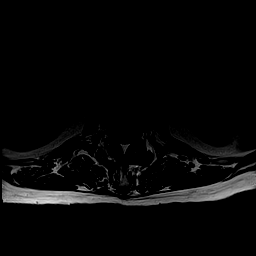
[im 33/33]
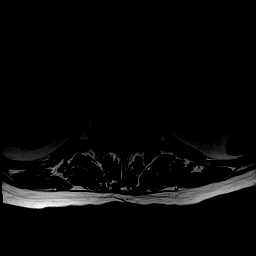

[44 of 48 positions shown; findings below may reference images not displayed]

FINDINGS: Normal signal is present in the conus medullaris which terminates at
L1-2, within normal limits. Pedicle screw and rod fixation is
present L3-S1 on the right and L2-S1 on the left.

7 mm retrolisthesis at L2-3 is stable. There is progressive endplate
marrow change.

Alignment is otherwise anatomic. Limited imaging of the abdomen is
unremarkable. There is no significant adenopathy.

L1-2: Negative.

L2-3: Bilateral laminectomies are noted. The central canal is
decompressed. Mild to moderate foraminal stenosis is slightly worse
than on the prior exam.

L3-4: Lumbar fusion is solid. The canal is decompressed posteriorly.
There is no significant central or foraminal stenosis.

L4-5: The patient is fused. The central canal and foramina are
patent.

L5-S1: The patient is fused. No residual or recurrent stenosis is
present.
IMPRESSION: 1. Stable retrolisthesis with progressive marrow changes at L2-3.
2. Mild foraminal narrowing is slightly worse than on the prior
exam.
3. The progressive marrow changes at L2-3 are likely reactive.
Progressive disease suggests nonunion.
4. Lumbar fusion and decompression at L3-4, L4-5, and L5-S1 without
significant residual or recurrent stenosis.

## 2016-08-21 DIAGNOSIS — E782 Mixed hyperlipidemia: Secondary | ICD-10-CM | POA: Diagnosis not present

## 2016-08-21 DIAGNOSIS — R42 Dizziness and giddiness: Secondary | ICD-10-CM | POA: Diagnosis not present

## 2016-08-21 DIAGNOSIS — I10 Essential (primary) hypertension: Secondary | ICD-10-CM | POA: Diagnosis not present

## 2016-08-29 DIAGNOSIS — E876 Hypokalemia: Secondary | ICD-10-CM | POA: Diagnosis not present

## 2016-09-07 DIAGNOSIS — R600 Localized edema: Secondary | ICD-10-CM | POA: Diagnosis not present

## 2016-09-07 DIAGNOSIS — I10 Essential (primary) hypertension: Secondary | ICD-10-CM | POA: Diagnosis not present

## 2016-09-11 DIAGNOSIS — I340 Nonrheumatic mitral (valve) insufficiency: Secondary | ICD-10-CM | POA: Diagnosis not present

## 2016-09-11 DIAGNOSIS — R600 Localized edema: Secondary | ICD-10-CM | POA: Diagnosis not present

## 2016-09-25 DIAGNOSIS — M797 Fibromyalgia: Secondary | ICD-10-CM | POA: Diagnosis not present

## 2016-09-25 DIAGNOSIS — E782 Mixed hyperlipidemia: Secondary | ICD-10-CM | POA: Diagnosis not present

## 2016-09-25 DIAGNOSIS — Z1389 Encounter for screening for other disorder: Secondary | ICD-10-CM | POA: Diagnosis not present

## 2016-09-25 DIAGNOSIS — M545 Low back pain: Secondary | ICD-10-CM | POA: Diagnosis not present

## 2016-09-25 DIAGNOSIS — I10 Essential (primary) hypertension: Secondary | ICD-10-CM | POA: Diagnosis not present

## 2016-09-25 DIAGNOSIS — K219 Gastro-esophageal reflux disease without esophagitis: Secondary | ICD-10-CM | POA: Diagnosis not present

## 2016-10-29 DIAGNOSIS — J0101 Acute recurrent maxillary sinusitis: Secondary | ICD-10-CM | POA: Diagnosis not present

## 2016-10-29 DIAGNOSIS — M7062 Trochanteric bursitis, left hip: Secondary | ICD-10-CM | POA: Diagnosis not present

## 2016-11-26 DIAGNOSIS — J301 Allergic rhinitis due to pollen: Secondary | ICD-10-CM | POA: Diagnosis not present

## 2016-11-26 DIAGNOSIS — S40011A Contusion of right shoulder, initial encounter: Secondary | ICD-10-CM | POA: Diagnosis not present

## 2016-12-14 DIAGNOSIS — Z23 Encounter for immunization: Secondary | ICD-10-CM | POA: Diagnosis not present

## 2016-12-24 DIAGNOSIS — J0100 Acute maxillary sinusitis, unspecified: Secondary | ICD-10-CM | POA: Diagnosis not present

## 2016-12-24 DIAGNOSIS — R05 Cough: Secondary | ICD-10-CM | POA: Diagnosis not present

## 2016-12-31 DIAGNOSIS — N183 Chronic kidney disease, stage 3 (moderate): Secondary | ICD-10-CM | POA: Diagnosis not present

## 2016-12-31 DIAGNOSIS — E782 Mixed hyperlipidemia: Secondary | ICD-10-CM | POA: Diagnosis not present

## 2016-12-31 DIAGNOSIS — I10 Essential (primary) hypertension: Secondary | ICD-10-CM | POA: Diagnosis not present

## 2016-12-31 DIAGNOSIS — M797 Fibromyalgia: Secondary | ICD-10-CM | POA: Diagnosis not present

## 2016-12-31 DIAGNOSIS — Z9189 Other specified personal risk factors, not elsewhere classified: Secondary | ICD-10-CM | POA: Diagnosis not present

## 2016-12-31 DIAGNOSIS — K219 Gastro-esophageal reflux disease without esophagitis: Secondary | ICD-10-CM | POA: Diagnosis not present

## 2017-01-02 DIAGNOSIS — E782 Mixed hyperlipidemia: Secondary | ICD-10-CM | POA: Diagnosis not present

## 2017-01-02 DIAGNOSIS — Z23 Encounter for immunization: Secondary | ICD-10-CM | POA: Diagnosis not present

## 2017-01-02 DIAGNOSIS — Z0001 Encounter for general adult medical examination with abnormal findings: Secondary | ICD-10-CM | POA: Diagnosis not present

## 2017-01-02 DIAGNOSIS — J301 Allergic rhinitis due to pollen: Secondary | ICD-10-CM | POA: Diagnosis not present

## 2017-01-02 DIAGNOSIS — I10 Essential (primary) hypertension: Secondary | ICD-10-CM | POA: Diagnosis not present

## 2017-01-16 DIAGNOSIS — I10 Essential (primary) hypertension: Secondary | ICD-10-CM | POA: Diagnosis not present

## 2017-01-16 DIAGNOSIS — N183 Chronic kidney disease, stage 3 (moderate): Secondary | ICD-10-CM | POA: Diagnosis not present

## 2017-01-16 DIAGNOSIS — K219 Gastro-esophageal reflux disease without esophagitis: Secondary | ICD-10-CM | POA: Diagnosis not present

## 2017-01-16 DIAGNOSIS — E876 Hypokalemia: Secondary | ICD-10-CM | POA: Diagnosis not present

## 2017-01-17 DIAGNOSIS — J0100 Acute maxillary sinusitis, unspecified: Secondary | ICD-10-CM | POA: Diagnosis not present

## 2017-02-04 DIAGNOSIS — M797 Fibromyalgia: Secondary | ICD-10-CM | POA: Diagnosis not present

## 2017-02-20 DIAGNOSIS — M545 Low back pain: Secondary | ICD-10-CM | POA: Diagnosis not present

## 2017-02-20 DIAGNOSIS — M7062 Trochanteric bursitis, left hip: Secondary | ICD-10-CM | POA: Diagnosis not present

## 2017-03-05 DIAGNOSIS — M5432 Sciatica, left side: Secondary | ICD-10-CM | POA: Diagnosis not present

## 2017-03-05 DIAGNOSIS — M545 Low back pain: Secondary | ICD-10-CM | POA: Diagnosis not present

## 2017-03-11 DIAGNOSIS — M5432 Sciatica, left side: Secondary | ICD-10-CM | POA: Diagnosis not present

## 2017-03-11 DIAGNOSIS — M545 Low back pain: Secondary | ICD-10-CM | POA: Diagnosis not present

## 2017-03-11 DIAGNOSIS — Z981 Arthrodesis status: Secondary | ICD-10-CM | POA: Diagnosis not present

## 2017-03-14 ENCOUNTER — Other Ambulatory Visit: Payer: Self-pay | Admitting: Family Medicine

## 2017-03-14 DIAGNOSIS — M5136 Other intervertebral disc degeneration, lumbar region: Secondary | ICD-10-CM

## 2017-03-14 DIAGNOSIS — M545 Low back pain: Secondary | ICD-10-CM | POA: Diagnosis not present

## 2017-03-14 DIAGNOSIS — M5432 Sciatica, left side: Secondary | ICD-10-CM | POA: Diagnosis not present

## 2017-03-27 DIAGNOSIS — I10 Essential (primary) hypertension: Secondary | ICD-10-CM | POA: Diagnosis not present

## 2017-03-27 DIAGNOSIS — E876 Hypokalemia: Secondary | ICD-10-CM | POA: Diagnosis not present

## 2017-03-27 DIAGNOSIS — N183 Chronic kidney disease, stage 3 (moderate): Secondary | ICD-10-CM | POA: Diagnosis not present

## 2017-03-27 DIAGNOSIS — E782 Mixed hyperlipidemia: Secondary | ICD-10-CM | POA: Diagnosis not present

## 2017-03-27 DIAGNOSIS — K219 Gastro-esophageal reflux disease without esophagitis: Secondary | ICD-10-CM | POA: Diagnosis not present

## 2017-04-02 DIAGNOSIS — E782 Mixed hyperlipidemia: Secondary | ICD-10-CM | POA: Diagnosis not present

## 2017-04-02 DIAGNOSIS — K219 Gastro-esophageal reflux disease without esophagitis: Secondary | ICD-10-CM | POA: Diagnosis not present

## 2017-04-02 DIAGNOSIS — I10 Essential (primary) hypertension: Secondary | ICD-10-CM | POA: Diagnosis not present

## 2017-04-02 DIAGNOSIS — M545 Low back pain: Secondary | ICD-10-CM | POA: Diagnosis not present

## 2017-04-02 DIAGNOSIS — N183 Chronic kidney disease, stage 3 (moderate): Secondary | ICD-10-CM | POA: Diagnosis not present

## 2017-04-02 DIAGNOSIS — M7551 Bursitis of right shoulder: Secondary | ICD-10-CM | POA: Diagnosis not present

## 2017-04-03 ENCOUNTER — Ambulatory Visit (INDEPENDENT_AMBULATORY_CARE_PROVIDER_SITE_OTHER): Payer: Medicare Other | Admitting: Physical Medicine and Rehabilitation

## 2017-04-03 ENCOUNTER — Encounter (INDEPENDENT_AMBULATORY_CARE_PROVIDER_SITE_OTHER): Payer: Self-pay | Admitting: Physical Medicine and Rehabilitation

## 2017-04-03 VITALS — BP 132/77

## 2017-04-03 DIAGNOSIS — M797 Fibromyalgia: Secondary | ICD-10-CM

## 2017-04-03 DIAGNOSIS — M5416 Radiculopathy, lumbar region: Secondary | ICD-10-CM

## 2017-04-03 DIAGNOSIS — M961 Postlaminectomy syndrome, not elsewhere classified: Secondary | ICD-10-CM | POA: Diagnosis not present

## 2017-04-03 DIAGNOSIS — G894 Chronic pain syndrome: Secondary | ICD-10-CM | POA: Diagnosis not present

## 2017-04-03 NOTE — Progress Notes (Deleted)
Pt states pain in right and left hip, pain radiates down her left leg, left knee, and left foot. Pt states numbness in both feet, big toe stays numb, and tingling in both hips. Pt states pain started about 4 months ago and has gotten worse over time. Pt states normal activity makes pain worse and nothing makes the pain better.

## 2017-04-15 ENCOUNTER — Encounter (INDEPENDENT_AMBULATORY_CARE_PROVIDER_SITE_OTHER): Payer: Self-pay | Admitting: Physical Medicine and Rehabilitation

## 2017-04-15 ENCOUNTER — Ambulatory Visit (INDEPENDENT_AMBULATORY_CARE_PROVIDER_SITE_OTHER): Payer: Medicare Other

## 2017-04-15 ENCOUNTER — Ambulatory Visit (INDEPENDENT_AMBULATORY_CARE_PROVIDER_SITE_OTHER): Payer: Medicare Other | Admitting: Physical Medicine and Rehabilitation

## 2017-04-15 VITALS — BP 119/71 | HR 76 | Temp 97.8°F

## 2017-04-15 DIAGNOSIS — M961 Postlaminectomy syndrome, not elsewhere classified: Secondary | ICD-10-CM

## 2017-04-15 DIAGNOSIS — M5416 Radiculopathy, lumbar region: Secondary | ICD-10-CM

## 2017-04-15 MED ORDER — BETAMETHASONE SOD PHOS & ACET 6 (3-3) MG/ML IJ SUSP
12.0000 mg | Freq: Once | INTRAMUSCULAR | Status: AC
  Administered 2017-04-15: 12 mg

## 2017-04-15 NOTE — Progress Notes (Deleted)
Here for planned left L4 and L5 TF. No changes since she was last seen.

## 2017-04-15 NOTE — Patient Instructions (Signed)

## 2017-04-16 NOTE — Procedures (Signed)
[name] is a 62 year old female who comes in today for planned left L4-L5 transforaminal epidural steroid injection for left radicular leg pain.  Please see our prior evaluation and management note for further details and justification.  Lumbosacral Transforaminal Epidural Steroid Injection - Sub-Pedicular Approach with Fluoroscopic Guidance  Patient: [name]      Date of Birth: [birth year] MRN: [id number] PCP: Caryl Bis, MD      Visit Date: 04/15/2017   Universal Protocol:    Date/Time: 04/15/2017  Consent Given By: the patient  Position: PRONE  Additional Comments: Vital signs were monitored before and after the procedure. Patient was prepped and draped in the usual sterile fashion. The correct patient, procedure, and site was verified.   Injection Procedure Details:  Procedure Site One Meds Administered:  Meds ordered this encounter  Medications  . betamethasone acetate-betamethasone sodium phosphate (CELESTONE) injection 12 mg    Laterality: Left  Location/Site:  L4-L5 L5-S1  Needle size: 22 G  Needle type: Spinal  Needle Placement: Transforaminal  Findings:    -Comments: Excellent flow of contrast along the nerve and into the epidural space.  Procedure Details: After squaring off the end-plates to get a true AP view, the C-arm was positioned so that an oblique view of the foramen as noted above was visualized. The target area is just inferior to the "nose of the scotty dog" or sub pedicular. The soft tissues overlying this structure were infiltrated with 2-3 ml. of 1% Lidocaine without Epinephrine.  The spinal needle was inserted toward the target using a "trajectory" view along the fluoroscope beam.  Under AP and lateral visualization, the needle was advanced so it did not puncture dura and was located close the 6 O'Clock position of the pedical in AP tracterory. Biplanar projections were used to confirm position. Aspiration was confirmed to be  negative for CSF and/or blood. A 1-2 ml. volume of Isovue-250 was injected and flow of contrast was noted at each level. Radiographs were obtained for documentation purposes.   After attaining the desired flow of contrast documented above, a 0.5 to 1.0 ml test dose of 0.25% Marcaine was injected into each respective transforaminal space.  The patient was observed for 90 seconds post injection.  After no sensory deficits were reported, and normal lower extremity motor function was noted,   the above injectate was administered so that equal amounts of the injectate were placed at each foramen (level) into the transforaminal epidural space.   Additional Comments:  The patient tolerated the procedure well Dressing: Band-Aid    Post-procedure details: Patient was observed during the procedure. Post-procedure instructions were reviewed.  Patient left the clinic in stable condition.   Pertinent Imaging: MRI LUMBAR SPINE WITHOUT CONTRAST  TECHNIQUE: Multiplanar, multisequence MR imaging of the lumbar spine was performed. No intravenous contrast was administered.  COMPARISON: CT lumbar spine 05/17/2015. MRI lumbar spine 06/05/2014.  FINDINGS: Segmentation: Standard.  Alignment: There is unchanged 0.7 cm retrolisthesis L2 on L3. Convex left scoliosis noted.  Vertebrae: No fracture or worrisome lesion. Degenerative endplate signal change at L2-3 is again seen. The patient is status post L3-S1 fusion.  Conus medullaris and cauda equina: Conus extends to the L1-2 level. Conus and cauda equina appear normal.  Paraspinal and other soft tissues: Negative.  Disc levels:  T11-12 is imaged in the sagittal plane only and negative.  T12-L1: Status post posterior decompression. The central canal and foramina are widely patent.  L1-2: Status post posterior decompression. The central canal  and foramina are widely patent.  L2-3: Status post discectomy and fusion. There is some  endplate spurring but the central canal and foramina are open. The appearance is unchanged.  L3-4: Status post discectomy and fusion. The central canal and foramina are widely patent.  L4-5: Status post discectomy and fusion. The central canal and neural foramina are widely patent.  L5-S1: Status post discectomy and fusion. There is some endplate spurring but the central canal and foramina are widely patent.  IMPRESSION: The central canal and foramina are open at all levels with postoperative change from T12-S1 as described above.   Electronically Signed By: Inge Rise M.D. On: 03/11/2017 14:05

## 2017-04-22 DIAGNOSIS — Z1231 Encounter for screening mammogram for malignant neoplasm of breast: Secondary | ICD-10-CM | POA: Diagnosis not present

## 2017-05-07 ENCOUNTER — Encounter (INDEPENDENT_AMBULATORY_CARE_PROVIDER_SITE_OTHER): Payer: Self-pay | Admitting: Physical Medicine and Rehabilitation

## 2017-05-07 NOTE — Progress Notes (Signed)
[name] - 62 y.o. female MRN [id number]  Date of birth: 1955/11/17  Office Visit Note: Visit Date: 04/03/2017 PCP: Caryl Bis, MD Referred by: Caryl Bis, MD  Subjective: Chief Complaint  Patient presents with  . Lower Back - Pain  . Left Hip - Pain, Tingling  . Right Hip - Pain, Tingling  . Left Knee - Pain  . Left Foot - Numbness, Pain  . Right Foot - Numbness   HPI: [name] is a 62 year old female who is a patient of Dr. Louanne Skye who comes in today for evaluation and management of her left hip and leg pain which seems to be radicular to the foot.  She reports numbness in both feet and that her left great toe stays numb tingling somewhat in both hips.  She reports the main complaint however is a pretty deep aching pain in her left hip and leg to the knee and foot more L5 possibly L4 distribution.  She reports this started about 4 months ago and is just gotten worse over time.  She reports any normal activity will make it worse and nothing so far has made it better including his pain medications and other adjunctive medications.  Her spine history is quite complicated in that she has had 3 prior lumbar surgeries by Dr. Louanne Skye.  She is status post fusion from T12 or L1 to the sacrum.  She has had an updated MRI in December 2018 without any real changes.  She looks to have pretty open spaces from a nerve standpoint throughout the lumbar spine.  She does not report any specific injury.  She has had no bowel or bladder issues.  She has had no fevers chills or night sweats or unexplained weight loss.  She still tries to maintain activity levels.  She reports the last injection we performed which was over 2 years ago after L4-L5 transforaminal injection gave her great relief up until 4 months ago.  Feels like this is the same pain in the same area.  She reports her pain is quite severe and limiting.  She is given of a lot of time to see if it would get better.  Her case is  complicated by fibromyalgia.    Review of Systems  Constitutional: Negative for chills, fever, malaise/fatigue and weight loss.  HENT: Negative for hearing loss and sinus pain.   Eyes: Negative for blurred vision, double vision and photophobia.  Respiratory: Negative for cough and shortness of breath.   Cardiovascular: Negative for chest pain, palpitations and leg swelling.  Gastrointestinal: Negative for abdominal pain, nausea and vomiting.  Genitourinary: Negative for flank pain.  Musculoskeletal: Positive for back pain. Negative for myalgias.       Left hip and leg pain  Skin: Negative for itching and rash.  Neurological: Positive for tingling. Negative for tremors, focal weakness and weakness.  Endo/Heme/Allergies: Negative.   Psychiatric/Behavioral: Negative for depression.  All other systems reviewed and are negative.  Otherwise per HPI.  Assessment & Plan: Visit Diagnoses:  1. Post laminectomy syndrome   2. Lumbar radiculopathy   3. Fibromyalgia   4. Chronic pain syndrome     Plan: Findings:  Complicated chronic spine pain patient with history of fibromyalgia 12 her L1 to the sacrum fusion status post 3 lumbar fusions with nonhealing of the fusion.  Is been doing well for a couple of years after injection on the left side at L4-L5.  These injections were from a transforaminal approach  for a very similar radicular pain.  New MRI imaging does not show any source of the pain in terms of nerve compression.  He has had no new injury.  I think repeating the injection is worthwhile from a diagnostic standpoint.  He might be someone who could look at a spinal cord stimulator trial although she is fused fairly high up and she does have fibromyalgia but this is a case where people tend to do fairly well with that type of needed.  Depending on the results she can also follow-up with Dr. Louanne Skye.  At least at this point from the current MRI there is really nothing from a surgical standpoint that  I could see.  She should maintain her current medications through her primary care physician.  She should also maintain some activity level with exercise when she can.  With fibromyalgia sleep hygiene is important as well as activity.  She has had opioid much success and she has not been very tolerant with those medications.    Meds & Orders: No orders of the defined types were placed in this encounter.  No orders of the defined types were placed in this encounter.   Follow-up: Return for Left L4-L5 transforaminal epidural steroid injection..   Procedures: No procedures performed  No notes on file   Clinical History: MRI LUMBAR SPINE WITHOUT CONTRAST  TECHNIQUE: Multiplanar, multisequence MR imaging of the lumbar spine was performed. No intravenous contrast was administered.  COMPARISON: CT lumbar spine 05/17/2015. MRI lumbar spine 06/05/2014.  FINDINGS: Segmentation: Standard.  Alignment: There is unchanged 0.7 cm retrolisthesis L2 on L3. Convex left scoliosis noted.  Vertebrae: No fracture or worrisome lesion. Degenerative endplate signal change at L2-3 is again seen. The patient is status post L3-S1 fusion.  Conus medullaris and cauda equina: Conus extends to the L1-2 level. Conus and cauda equina appear normal.  Paraspinal and other soft tissues: Negative.  Disc levels:  T11-12 is imaged in the sagittal plane only and negative.  T12-L1: Status post posterior decompression. The central canal and foramina are widely patent.  L1-2: Status post posterior decompression. The central canal and foramina are widely patent.  L2-3: Status post discectomy and fusion. There is some endplate spurring but the central canal and foramina are open. The appearance is unchanged.  L3-4: Status post discectomy and fusion. The central canal and foramina are widely patent.  L4-5: Status post discectomy and fusion. The central canal and neural foramina are widely patent.  L5-S1:  Status post discectomy and fusion. There is some endplate spurring but the central canal and foramina are widely patent.  IMPRESSION: The central canal and foramina are open at all levels with postoperative change from T12-S1 as described above.   Electronically Signed By: Inge Rise M.D. On: 03/11/2017 14:05  She reports that  has never smoked. she has never used smokeless tobacco. No results for input(s): HGBA1C, LABURIC in the last 8760 hours.  Objective:  VS:  HT:    WT:   BMI:     BP:132/77  HR: bpm  TEMP: ( )  RESP:  Physical Exam  Constitutional: She is oriented to person, place, and time. She appears well-developed and well-nourished. No distress.  HENT:  Head: Normocephalic and atraumatic.  Nose: Nose normal.  Mouth/Throat: Oropharynx is clear and moist.  Eyes: Conjunctivae and EOM are normal. Pupils are equal, round, and reactive to light.  Neck: Normal range of motion. Neck supple. No JVD present.  Cardiovascular: Normal rate, regular rhythm and  intact distal pulses.  Pulmonary/Chest: Effort normal. No respiratory distress.  Abdominal: Soft. She exhibits no distension. There is no guarding.  Musculoskeletal:  Patient ambulates without aid.  She is slow to rise from a seated position.  She is very stiff throughout the lumbar spine with extension rotation without much in the way of pain with that.  She does have tenderness across the lumbar spine with tender points bilaterally across the lumbosacral junction and greater trochanters and laterally to the knees.  She has no pain with hip rotation.  She has good distal strength without clonus.  Neurological: She is alert and oriented to person, place, and time. She exhibits normal muscle tone. Coordination normal.  Skin: Skin is warm and dry. No rash noted. No erythema.  Psychiatric: She has a normal mood and affect. Her behavior is normal.  Nursing note and vitals reviewed.   Ortho Exam Imaging: No results  found.  Past Medical/Family/Surgical/Social History: Medications & Allergies reviewed per EMR Patient Active Problem List   Diagnosis Date Noted  . Ileus, postoperative ([place]) 08/13/2014    Class: Acute  . Pseudarthrosis after fusion or arthrodesis 08/09/2014    Class: Chronic  . Painful orthopaedic hardware ([place]) 08/09/2014    Class: Chronic  . Vulvar cysts 04/26/2014  . Spinal stenosis, lumbar region, with neurogenic claudication 04/07/2013    Class: Chronic  . Degenerative spondylolisthesis 04/07/2013    Class: Chronic  . Spinal stenosis of lumbar region with neurogenic claudication 04/07/2013   Past Medical History:  Diagnosis Date  . Arthritis   . Asthma   . Cancer (HCC)    cervical , skin  . Cold    recent  rx  . Fibromyalgia   . GERD (gastroesophageal reflux disease)   . H/O hiatal hernia   . Hypertension   . PONV (postoperative nausea and vomiting)   . Shortness of breath   . Sleep apnea    ' mild " does not use cpap   History reviewed. No pertinent family history. Past Surgical History:  Procedure Laterality Date  . ABDOMINAL HYSTERECTOMY  04  . APPENDECTOMY    . BACK SURGERY     x3  . CERVICAL CONIZATION W/BX    . CERVICAL DISC SURGERY  06  . CHOLECYSTECTOMY  03  . LUMBAR FUSION  04/07/2013   L 2 L3 L4    with rod     DR NITKA  . TONSILLECTOMY     Social History   Occupational History  . Not on file  Tobacco Use  . Smoking status: Never Smoker  . Smokeless tobacco: Never Used  Substance and Sexual Activity  . Alcohol use: No  . Drug use: No  . Sexual activity: Yes    Birth control/protection: Surgical

## 2017-05-13 DIAGNOSIS — J0101 Acute recurrent maxillary sinusitis: Secondary | ICD-10-CM | POA: Diagnosis not present

## 2017-05-13 DIAGNOSIS — M5030 Other cervical disc degeneration, unspecified cervical region: Secondary | ICD-10-CM | POA: Diagnosis not present

## 2017-05-13 DIAGNOSIS — M797 Fibromyalgia: Secondary | ICD-10-CM | POA: Diagnosis not present

## 2017-05-28 DIAGNOSIS — J209 Acute bronchitis, unspecified: Secondary | ICD-10-CM | POA: Diagnosis not present

## 2017-06-03 DIAGNOSIS — R05 Cough: Secondary | ICD-10-CM | POA: Diagnosis not present

## 2017-06-03 DIAGNOSIS — R0602 Shortness of breath: Secondary | ICD-10-CM | POA: Diagnosis not present

## 2017-06-21 DIAGNOSIS — G5603 Carpal tunnel syndrome, bilateral upper limbs: Secondary | ICD-10-CM | POA: Diagnosis not present

## 2017-06-21 DIAGNOSIS — M797 Fibromyalgia: Secondary | ICD-10-CM | POA: Diagnosis not present

## 2017-07-02 DIAGNOSIS — H5203 Hypermetropia, bilateral: Secondary | ICD-10-CM | POA: Diagnosis not present

## 2017-07-02 DIAGNOSIS — H52223 Regular astigmatism, bilateral: Secondary | ICD-10-CM | POA: Diagnosis not present

## 2017-07-02 DIAGNOSIS — H524 Presbyopia: Secondary | ICD-10-CM | POA: Diagnosis not present

## 2017-07-02 DIAGNOSIS — H2513 Age-related nuclear cataract, bilateral: Secondary | ICD-10-CM | POA: Diagnosis not present

## 2017-07-03 ENCOUNTER — Telehealth (INDEPENDENT_AMBULATORY_CARE_PROVIDER_SITE_OTHER): Payer: Self-pay | Admitting: Physical Medicine and Rehabilitation

## 2017-07-03 DIAGNOSIS — N183 Chronic kidney disease, stage 3 (moderate): Secondary | ICD-10-CM | POA: Diagnosis not present

## 2017-07-03 DIAGNOSIS — M797 Fibromyalgia: Secondary | ICD-10-CM | POA: Diagnosis not present

## 2017-07-03 DIAGNOSIS — E782 Mixed hyperlipidemia: Secondary | ICD-10-CM | POA: Diagnosis not present

## 2017-07-03 DIAGNOSIS — I10 Essential (primary) hypertension: Secondary | ICD-10-CM | POA: Diagnosis not present

## 2017-07-03 NOTE — Telephone Encounter (Signed)
yes

## 2017-07-04 NOTE — Telephone Encounter (Signed)
Left message for patient to call back to discuss/ schedule. 

## 2017-07-04 NOTE — Telephone Encounter (Signed)
Scheduled for 07/22/17 at 1000 with driver and no blood thinners.

## 2017-07-09 ENCOUNTER — Encounter (INDEPENDENT_AMBULATORY_CARE_PROVIDER_SITE_OTHER): Payer: Self-pay | Admitting: Physical Medicine and Rehabilitation

## 2017-07-09 ENCOUNTER — Ambulatory Visit (INDEPENDENT_AMBULATORY_CARE_PROVIDER_SITE_OTHER): Payer: Medicare Other | Admitting: Physical Medicine and Rehabilitation

## 2017-07-09 ENCOUNTER — Ambulatory Visit (INDEPENDENT_AMBULATORY_CARE_PROVIDER_SITE_OTHER): Payer: Medicare Other

## 2017-07-09 VITALS — BP 126/84 | HR 78 | Temp 97.9°F

## 2017-07-09 DIAGNOSIS — M797 Fibromyalgia: Secondary | ICD-10-CM

## 2017-07-09 DIAGNOSIS — M961 Postlaminectomy syndrome, not elsewhere classified: Secondary | ICD-10-CM

## 2017-07-09 DIAGNOSIS — M5416 Radiculopathy, lumbar region: Secondary | ICD-10-CM

## 2017-07-09 DIAGNOSIS — G894 Chronic pain syndrome: Secondary | ICD-10-CM

## 2017-07-09 MED ORDER — BETAMETHASONE SOD PHOS & ACET 6 (3-3) MG/ML IJ SUSP
12.0000 mg | Freq: Once | INTRAMUSCULAR | Status: AC
  Administered 2017-07-09: 12 mg

## 2017-07-09 NOTE — Progress Notes (Signed)
 .  Numeric Pain Rating Scale and Functional Assessment Average Pain 7   In the last MONTH (on 0-10 scale) has pain interfered with the following?  1. General activity like being  able to carry out your everyday physical activities such as walking, climbing stairs, carrying groceries, or moving a chair?  Rating(4)   +Driver, -BT, -Dye Allergies.  

## 2017-07-09 NOTE — Patient Instructions (Signed)

## 2017-07-16 DIAGNOSIS — K219 Gastro-esophageal reflux disease without esophagitis: Secondary | ICD-10-CM | POA: Diagnosis not present

## 2017-07-16 DIAGNOSIS — M797 Fibromyalgia: Secondary | ICD-10-CM | POA: Diagnosis not present

## 2017-07-16 DIAGNOSIS — I10 Essential (primary) hypertension: Secondary | ICD-10-CM | POA: Diagnosis not present

## 2017-07-17 DIAGNOSIS — M797 Fibromyalgia: Secondary | ICD-10-CM | POA: Diagnosis not present

## 2017-07-17 DIAGNOSIS — I10 Essential (primary) hypertension: Secondary | ICD-10-CM | POA: Diagnosis not present

## 2017-07-17 DIAGNOSIS — K219 Gastro-esophageal reflux disease without esophagitis: Secondary | ICD-10-CM | POA: Diagnosis not present

## 2017-07-18 DIAGNOSIS — R071 Chest pain on breathing: Secondary | ICD-10-CM | POA: Diagnosis not present

## 2017-07-18 NOTE — Progress Notes (Signed)
[name] - 62 y.o. female MRN [id number]  Date of birth: 1955/07/23  Office Visit Note: Visit Date: 07/09/2017 PCP: Caryl Bis, MD Referred by: Caryl Bis, MD  Subjective: Chief Complaint  Patient presents with  . Lower Back - Pain  . Left Leg - Pain  . Left Foot - Numbness   HPI: [name] is a 62 year old female with long history of chronic pain and fibromyalgia and lumbar spine history of multiple back surgeries.  She has a fusion from L3 to the sacrum.  This may in fact be L2 to the sacrum.  She has had discectomies and laminectomies performed at T12-L2.  I last saw her in January of this year and completed a left L4-L5 transforaminal epidural steroid injection with some relief for about a month and then the pain returned pretty significantly.  She reports significant radicular complaints down the left leg with numbness in the left foot.  This really has been chronic and long-term even since the surgery.  She reports worsening however over about a month ago.  She states that trying to put socks and shoes on makes the pain worse and nothing really makes it any better.  She denies any hip or groin pain specifically.  She still tries to stay active and doing what she can do.  Functionally she rates her severity of a 4 out of 10.  She is able to do most things she wants to do but she has to do this very slowly and and parts.  She denies any focal weakness.  She has had no bowel or bladder symptoms or no red flag complaints.  The injection we performed prior to the one in January seem to help her for quite a long time.  Reviewing the images shows similarly placed injections and medication compared to the last injection.  She is having more pain on both sides but again more left-sided pain down the leg.  we had given her some information on spinal cord stimulators.  I spoke at length today on the process and the reasonings behind spinal cord stimulators.  She has tried and failed  conservative care over many years including multiple bouts of physical therapy as well and has multiple medications.  She has has intolerances and allergies to all morphine related compounds as well as tramadol and try cyclic antidepressants and and fentanyl patches and codeine.  She reports an allergy to Novocain but has done well with lidocaine.  She is currently taking Wellbutrin as well as meloxicam.   Review of Systems  Constitutional: Positive for malaise/fatigue. Negative for chills, fever and weight loss.  HENT: Negative for hearing loss and sinus pain.   Eyes: Negative for blurred vision, double vision and photophobia.  Respiratory: Negative for cough and shortness of breath.   Cardiovascular: Negative for chest pain, palpitations and leg swelling.  Gastrointestinal: Negative for abdominal pain, nausea and vomiting.  Genitourinary: Negative for flank pain.  Musculoskeletal: Positive for back pain. Negative for myalgias.  Skin: Negative for itching and rash.  Neurological: Positive for tingling. Negative for tremors, focal weakness and weakness.  Endo/Heme/Allergies: Negative.   Psychiatric/Behavioral: Negative for depression.  All other systems reviewed and are negative.  Otherwise per HPI.  Assessment & Plan: Visit Diagnoses:  1. Lumbar radiculopathy   2. Post laminectomy syndrome   3. Fibromyalgia   4. Chronic pain syndrome     Plan: Findings:  Chronic persistent and recalcitrant low back pain bilateral hip pain  laterally as well as left radicular leg pain and pretty classic L5 distribution.  Last injection did seem to give her some temporary relief but her symptoms are fairly constant.  It did not last very long it was approximately 1 month.  I think given the fact that it helped at all and that we did get one injection in the past that seemed to help her for quite a bit I would go ahead and complete bilateral L5 transforaminal injections.  Significant lumbar spine history of  surgery.  MRI is reviewed.  Looking at fluoroscopic imaging is available if she has probably a transitional segment with a lumbarized S1.  Nonetheless we will say this is L5 based on prior MRI imaging.  Some of her back pain could be related to her sacroiliac joints given the fact that she is fused to the sacrum.  Obviously the underlying fibromyalgia can probably make this a little bit worse.  We had a long discussion about spinal cord stimulator trial I think she would be a decent candidate for a trial.  She is really tried and failed all manner of other conservative care.  She has had treatment for fibromyalgia with medication and she does try to sleep and be active.  She really has intolerances to most all medications.  We gave her some information and talk again about the stimulators and she is going to let us know what she would like to do.  The next step would be a neuropsychological evaluation for pretrial authorization.  We did complete injection today of the bilateral L5 level.    Meds & Orders:  Meds ordered this encounter  Medications  . betamethasone acetate-betamethasone sodium phosphate (CELESTONE) injection 12 mg    Orders Placed This Encounter  Procedures  . XR C-ARM NO REPORT  . Epidural Steroid injection    Follow-up: Return if symptoms worsen or fail to improve.   Procedures: No procedures performed  Lumbosacral Transforaminal Epidural Steroid Injection - Sub-Pedicular Approach with Fluoroscopic Guidance  Patient: [name]      Date of Birth: 1956/02/16 MRN: [id number] PCP: Caryl Bis, MD      Visit Date: 07/09/2017   Universal Protocol:    Date/Time: 07/09/2017  Consent Given By: the patient  Position: PRONE  Additional Comments: Vital signs were monitored before and after the procedure. Patient was prepped and draped in the usual sterile fashion. The correct patient, procedure, and site was verified.   Injection Procedure Details:  Procedure  Site One Meds Administered:  Meds ordered this encounter  Medications  . betamethasone acetate-betamethasone sodium phosphate (CELESTONE) injection 12 mg    Laterality: Bilateral  Location/Site:  L5-S1  Needle size: 22 G  Needle type: Spinal  Needle Placement: Transforaminal  Findings:    -Comments: Excellent flow of contrast along the nerve and into the epidural space.  Procedure Details: After squaring off the end-plates to get a true AP view, the C-arm was positioned so that an oblique view of the foramen as noted above was visualized. The target area is just inferior to the "nose of the scotty dog" or sub pedicular. The soft tissues overlying this structure were infiltrated with 2-3 ml. of 1% Lidocaine without Epinephrine.  The spinal needle was inserted toward the target using a "trajectory" view along the fluoroscope beam.  Under AP and lateral visualization, the needle was advanced so it did not puncture dura and was located close the 6 O'Clock position of the pedical in AP  tracterory. Biplanar projections were used to confirm position. Aspiration was confirmed to be negative for CSF and/or blood. A 1-2 ml. volume of Isovue-250 was injected and flow of contrast was noted at each level. Radiographs were obtained for documentation purposes.   After attaining the desired flow of contrast documented above, a 0.5 to 1.0 ml test dose of 0.25% Marcaine was injected into each respective transforaminal space.  The patient was observed for 90 seconds post injection.  After no sensory deficits were reported, and normal lower extremity motor function was noted,   the above injectate was administered so that equal amounts of the injectate were placed at each foramen (level) into the transforaminal epidural space.   Additional Comments:  The patient tolerated the procedure well Dressing: Band-Aid    Post-procedure details: Patient was observed during the procedure. Post-procedure  instructions were reviewed.  Patient left the clinic in stable condition.    Clinical History: MRI LUMBAR SPINE WITHOUT CONTRAST  TECHNIQUE: Multiplanar, multisequence MR imaging of the lumbar spine was performed. No intravenous contrast was administered.  COMPARISON: CT lumbar spine 05/17/2015. MRI lumbar spine 06/05/2014.  FINDINGS: Segmentation: Standard.  Alignment: There is unchanged 0.7 cm retrolisthesis L2 on L3. Convex left scoliosis noted.  Vertebrae: No fracture or worrisome lesion. Degenerative endplate signal change at L2-3 is again seen. The patient is status post L3-S1 fusion.  Conus medullaris and cauda equina: Conus extends to the L1-2 level. Conus and cauda equina appear normal.  Paraspinal and other soft tissues: Negative.  Disc levels:  T11-12 is imaged in the sagittal plane only and negative.  T12-L1: Status post posterior decompression. The central canal and foramina are widely patent.  L1-2: Status post posterior decompression. The central canal and foramina are widely patent.  L2-3: Status post discectomy and fusion. There is some endplate spurring but the central canal and foramina are open. The appearance is unchanged.  L3-4: Status post discectomy and fusion. The central canal and foramina are widely patent.  L4-5: Status post discectomy and fusion. The central canal and neural foramina are widely patent.  L5-S1: Status post discectomy and fusion. There is some endplate spurring but the central canal and foramina are widely patent.  IMPRESSION: The central canal and foramina are open at all levels with postoperative change from T12-S1 as described above.   Electronically Signed By: Inge Rise M.D. On: 03/11/2017 14:05   She reports that she has never smoked. She has never used smokeless tobacco. No results for input(s): HGBA1C, LABURIC in the last 8760 hours.  Objective:  VS:  HT:    WT:   BMI:     BP:126/84  HR:78bpm   TEMP:97.9 F (36.6 C)(Oral)  RESP:97 % Physical Exam  Constitutional: She is oriented to person, place, and time. She appears well-developed and well-nourished. No distress.  HENT:  Head: Normocephalic and atraumatic.  Nose: Nose normal.  Mouth/Throat: Oropharynx is clear and moist.  Eyes: Pupils are equal, round, and reactive to light. Conjunctivae are normal.  Neck: Normal range of motion. Neck supple.  Cardiovascular: Regular rhythm and intact distal pulses.  Pulmonary/Chest: Effort normal. No respiratory distress.  Abdominal: She exhibits no distension. There is no guarding.  Musculoskeletal:  Evaluation of the lumbar spine shows well-healed long surgical incision with very stiff lumbar spine with extension rotation.  She has some pain over the PSIS bilaterally.  She has an equivocal Patrick's exam bilaterally with stiff hips in general but no real reproduction of pain specifically with internal rotation.  She has good distal strength without clonus.  Neurological: She is alert and oriented to person, place, and time. She exhibits normal muscle tone. Coordination normal.  Skin: Skin is warm. No rash noted. No erythema.  Psychiatric: She has a normal mood and affect. Her behavior is normal.  Nursing note and vitals reviewed.   Ortho Exam Imaging: No results found.  Past Medical/Family/Surgical/Social History: Medications & Allergies reviewed per EMR, new medications updated. Patient Active Problem List   Diagnosis Date Noted  . Ileus, postoperative ([place]) 08/13/2014    Class: Acute  . Pseudarthrosis after fusion or arthrodesis 08/09/2014    Class: Chronic  . Painful orthopaedic hardware ([place]) 08/09/2014    Class: Chronic  . Vulvar cysts 04/26/2014  . Spinal stenosis, lumbar region, with neurogenic claudication 04/07/2013    Class: Chronic  . Degenerative spondylolisthesis 04/07/2013    Class: Chronic  . Spinal stenosis of lumbar region with neurogenic claudication  04/07/2013   Past Medical History:  Diagnosis Date  . Arthritis   . Asthma   . Cancer (HCC)    cervical , skin  . Cold    recent  rx  . Fibromyalgia   . GERD (gastroesophageal reflux disease)   . H/O hiatal hernia   . Hypertension   . PONV (postoperative nausea and vomiting)   . Shortness of breath   . Sleep apnea    ' mild " does not use cpap   History reviewed. No pertinent family history. Past Surgical History:  Procedure Laterality Date  . ABDOMINAL HYSTERECTOMY  04  . APPENDECTOMY    . BACK SURGERY     x3  . CERVICAL CONIZATION W/BX    . CERVICAL DISC SURGERY  06  . CHOLECYSTECTOMY  03  . LUMBAR FUSION  04/07/2013   L 2 L3 L4    with rod     DR NITKA  . TONSILLECTOMY     Social History   Occupational History  . Not on file  Tobacco Use  . Smoking status: Never Smoker  . Smokeless tobacco: Never Used  Substance and Sexual Activity  . Alcohol use: No  . Drug use: No  . Sexual activity: Yes    Birth control/protection: Surgical

## 2017-07-18 NOTE — Procedures (Signed)
Lumbosacral Transforaminal Epidural Steroid Injection - Sub-Pedicular Approach with Fluoroscopic Guidance  Patient: [name]      Date of Birth: [birth year] MRN: [id number] PCP: Caryl Bis, MD      Visit Date: 07/09/2017   Universal Protocol:    Date/Time: 07/09/2017  Consent Given By: the patient  Position: PRONE  Additional Comments: Vital signs were monitored before and after the procedure. Patient was prepped and draped in the usual sterile fashion. The correct patient, procedure, and site was verified.   Injection Procedure Details:  Procedure Site One Meds Administered:  Meds ordered this encounter  Medications  . betamethasone acetate-betamethasone sodium phosphate (CELESTONE) injection 12 mg    Laterality: Bilateral  Location/Site:  L5-S1  Needle size: 22 G  Needle type: Spinal  Needle Placement: Transforaminal  Findings:    -Comments: Excellent flow of contrast along the nerve and into the epidural space.  Procedure Details: After squaring off the end-plates to get a true AP view, the C-arm was positioned so that an oblique view of the foramen as noted above was visualized. The target area is just inferior to the "nose of the scotty dog" or sub pedicular. The soft tissues overlying this structure were infiltrated with 2-3 ml. of 1% Lidocaine without Epinephrine.  The spinal needle was inserted toward the target using a "trajectory" view along the fluoroscope beam.  Under AP and lateral visualization, the needle was advanced so it did not puncture dura and was located close the 6 O'Clock position of the pedical in AP tracterory. Biplanar projections were used to confirm position. Aspiration was confirmed to be negative for CSF and/or blood. A 1-2 ml. volume of Isovue-250 was injected and flow of contrast was noted at each level. Radiographs were obtained for documentation purposes.   After attaining the desired flow of contrast documented above, a  0.5 to 1.0 ml test dose of 0.25% Marcaine was injected into each respective transforaminal space.  The patient was observed for 90 seconds post injection.  After no sensory deficits were reported, and normal lower extremity motor function was noted,   the above injectate was administered so that equal amounts of the injectate were placed at each foramen (level) into the transforaminal epidural space.   Additional Comments:  The patient tolerated the procedure well Dressing: Band-Aid    Post-procedure details: Patient was observed during the procedure. Post-procedure instructions were reviewed.  Patient left the clinic in stable condition.

## 2017-07-22 ENCOUNTER — Encounter (INDEPENDENT_AMBULATORY_CARE_PROVIDER_SITE_OTHER): Payer: Medicare Other | Admitting: Physical Medicine and Rehabilitation

## 2017-08-14 DIAGNOSIS — G252 Other specified forms of tremor: Secondary | ICD-10-CM | POA: Diagnosis not present

## 2017-08-14 DIAGNOSIS — J301 Allergic rhinitis due to pollen: Secondary | ICD-10-CM | POA: Diagnosis not present

## 2017-08-20 DIAGNOSIS — J0101 Acute recurrent maxillary sinusitis: Secondary | ICD-10-CM | POA: Diagnosis not present

## 2017-09-18 DIAGNOSIS — R05 Cough: Secondary | ICD-10-CM | POA: Diagnosis not present

## 2017-09-18 DIAGNOSIS — J019 Acute sinusitis, unspecified: Secondary | ICD-10-CM | POA: Diagnosis not present

## 2017-09-19 DIAGNOSIS — Z9189 Other specified personal risk factors, not elsewhere classified: Secondary | ICD-10-CM | POA: Diagnosis not present

## 2017-09-19 DIAGNOSIS — I10 Essential (primary) hypertension: Secondary | ICD-10-CM | POA: Diagnosis not present

## 2017-09-19 DIAGNOSIS — K219 Gastro-esophageal reflux disease without esophagitis: Secondary | ICD-10-CM | POA: Diagnosis not present

## 2017-09-19 DIAGNOSIS — E782 Mixed hyperlipidemia: Secondary | ICD-10-CM | POA: Diagnosis not present

## 2017-09-20 DIAGNOSIS — K219 Gastro-esophageal reflux disease without esophagitis: Secondary | ICD-10-CM | POA: Diagnosis not present

## 2017-09-20 DIAGNOSIS — M797 Fibromyalgia: Secondary | ICD-10-CM | POA: Diagnosis not present

## 2017-09-20 DIAGNOSIS — I10 Essential (primary) hypertension: Secondary | ICD-10-CM | POA: Diagnosis not present

## 2017-09-23 DIAGNOSIS — M797 Fibromyalgia: Secondary | ICD-10-CM | POA: Diagnosis not present

## 2017-09-23 DIAGNOSIS — M545 Low back pain: Secondary | ICD-10-CM | POA: Diagnosis not present

## 2017-09-23 DIAGNOSIS — J301 Allergic rhinitis due to pollen: Secondary | ICD-10-CM | POA: Diagnosis not present

## 2017-09-23 DIAGNOSIS — I10 Essential (primary) hypertension: Secondary | ICD-10-CM | POA: Diagnosis not present

## 2017-09-23 DIAGNOSIS — E782 Mixed hyperlipidemia: Secondary | ICD-10-CM | POA: Diagnosis not present

## 2017-10-07 DIAGNOSIS — J209 Acute bronchitis, unspecified: Secondary | ICD-10-CM | POA: Diagnosis not present

## 2017-10-07 DIAGNOSIS — J0101 Acute recurrent maxillary sinusitis: Secondary | ICD-10-CM | POA: Diagnosis not present

## 2017-10-28 DIAGNOSIS — J0101 Acute recurrent maxillary sinusitis: Secondary | ICD-10-CM | POA: Diagnosis not present

## 2017-11-07 DIAGNOSIS — J209 Acute bronchitis, unspecified: Secondary | ICD-10-CM | POA: Diagnosis not present

## 2017-11-07 DIAGNOSIS — J0101 Acute recurrent maxillary sinusitis: Secondary | ICD-10-CM | POA: Diagnosis not present

## 2017-11-29 ENCOUNTER — Telehealth (INDEPENDENT_AMBULATORY_CARE_PROVIDER_SITE_OTHER): Payer: Self-pay | Admitting: Radiology

## 2017-11-29 NOTE — Telephone Encounter (Signed)
appt scheduled 12/17/17

## 2017-11-29 NOTE — Telephone Encounter (Signed)
Patient called requesting office visit to discuss tens unit or stimulator. Last office note 07/09/17 Dr. Ernestina Patches discussed stimulator.    Dr. Ernestina Patches approved office visit.

## 2017-11-30 DIAGNOSIS — M797 Fibromyalgia: Secondary | ICD-10-CM | POA: Diagnosis not present

## 2017-11-30 DIAGNOSIS — J301 Allergic rhinitis due to pollen: Secondary | ICD-10-CM | POA: Diagnosis not present

## 2017-12-17 ENCOUNTER — Encounter (INDEPENDENT_AMBULATORY_CARE_PROVIDER_SITE_OTHER): Payer: Self-pay | Admitting: Physical Medicine and Rehabilitation

## 2017-12-17 ENCOUNTER — Ambulatory Visit (INDEPENDENT_AMBULATORY_CARE_PROVIDER_SITE_OTHER): Payer: Medicare Other | Admitting: Physical Medicine and Rehabilitation

## 2017-12-17 VITALS — BP 129/84 | HR 84 | Ht 67.0 in | Wt 170.0 lb

## 2017-12-17 DIAGNOSIS — M5416 Radiculopathy, lumbar region: Secondary | ICD-10-CM | POA: Diagnosis not present

## 2017-12-17 DIAGNOSIS — M797 Fibromyalgia: Secondary | ICD-10-CM

## 2017-12-17 DIAGNOSIS — G894 Chronic pain syndrome: Secondary | ICD-10-CM | POA: Diagnosis not present

## 2017-12-17 DIAGNOSIS — M961 Postlaminectomy syndrome, not elsewhere classified: Secondary | ICD-10-CM

## 2017-12-17 NOTE — Progress Notes (Signed)
 .  Numeric Pain Rating Scale and Functional Assessment Average Pain 7 Pain Right Now 8 My pain is constant and aching Pain is worse with: walking and some activites Pain improves with: rest   In the last MONTH (on 0-10 scale) has pain interfered with the following?  1. General activity like being  able to carry out your everyday physical activities such as walking, climbing stairs, carrying groceries, or moving a chair?  Rating(7)  2. Relation with others like being able to carry out your usual social activities and roles such as  activities at home, at work and in your community. Rating(5)  3. Enjoyment of life such that you have  been bothered by emotional problems such as feeling anxious, depressed or irritable?  Rating(1)

## 2017-12-19 DIAGNOSIS — M7551 Bursitis of right shoulder: Secondary | ICD-10-CM | POA: Diagnosis not present

## 2017-12-19 DIAGNOSIS — M25511 Pain in right shoulder: Secondary | ICD-10-CM | POA: Diagnosis not present

## 2017-12-19 DIAGNOSIS — Z23 Encounter for immunization: Secondary | ICD-10-CM | POA: Diagnosis not present

## 2017-12-24 ENCOUNTER — Encounter (INDEPENDENT_AMBULATORY_CARE_PROVIDER_SITE_OTHER): Payer: Self-pay | Admitting: Physical Medicine and Rehabilitation

## 2017-12-24 NOTE — Progress Notes (Signed)
[name] - 62 y.o. female MRN [id number]  Date of birth: 08/03/1955  Office Visit Note: Visit Date: 12/17/2017 PCP: Caryl Bis, MD Referred by: Caryl Bis, MD  Subjective: Chief Complaint  Patient presents with  . Lower Back - Pain  . Right Leg - Pain  . Left Leg - Pain   HPI: [name] is a 62 year old female long-term patient of Dr. [name] in our office.  She is suffered from chronic back pain and left more than right hip and leg pain for many years.  Her history is such that she has had failure of conservative care pain management care with opioids and failure with lumbar surgery.  She has had lumbar fusion from L2-S1 with laminectomy decompression T12-L1 and L1-2.  These were complicated surgeries with some issues with fusion and redo type surgeries.  She has had multiple bouts of physical therapy in the past and continues with current medications including Mobic, clonazepam and tramadol.  She continues with recalcitrant low back and left more than right hip and leg pain.  Left pain has been traditionally the most problematic for her.  Her history is complicated by fibromyalgia and multiple drug intolerances.  A couple of years ago we completed transforaminal injection that actually gave her quite a bit of relief for about a year or so but lately we have been unable to reproduce that result with 2 injections recently from a transforaminal approach.  She has had updated imaging last year and this is reviewed below.  She reports no focal weakness or new findings.  No new trauma.  No fevers chills night sweats or other red flag complaints.  Pain is rated on average is 7 out of 10 worse with walking and activity better at sometimes with rest.  Is really constant and present all the time.  She is very frustrated with the amount of pain that she is having and nothing at this point is helpful.  There is nothing from a surgical standpoint that can be done.   Review of Systems    Constitutional: Negative for chills, fever, malaise/fatigue and weight loss.  HENT: Negative for hearing loss and sinus pain.   Eyes: Negative for blurred vision, double vision and photophobia.  Respiratory: Negative for cough and shortness of breath.   Cardiovascular: Negative for chest pain, palpitations and leg swelling.  Gastrointestinal: Negative for abdominal pain, nausea and vomiting.  Genitourinary: Negative for flank pain.  Musculoskeletal: Positive for back pain. Negative for myalgias.       Left more than right radicular pain  Skin: Negative for itching and rash.  Neurological: Negative for tremors, focal weakness and weakness.  Endo/Heme/Allergies: Negative.   Psychiatric/Behavioral: Negative for depression.  All other systems reviewed and are negative.  Otherwise per HPI.  Assessment & Plan: Visit Diagnoses:  1. Lumbar radiculopathy   2. Post laminectomy syndrome   3. Fibromyalgia   4. Chronic pain syndrome     Plan: Findings:  Chronic long-term persistent and recalcitrant low back and left more than right radicular pain despite conservative and nonconservative care including 3 prior lumbar surgeries including L2-S1 fusion with extension of laminectomy decompression at T12-L1 L1-2.  Imaging does not show any nerve compression.  Injection treatment has been sporadic but recently not very helpful.  She is intolerant to a lot of medications and is complicated by fibromyalgia.  At this point I think the procedure of last resort would be a spinal cord stimulator trial.  I do think we could enter above her last fusion which was at T12.  We may need to get thoracic MRI or CT scan.  We have gone over spinal cord stimulator risk and benefits and she does want to proceed.  We will place referral for neuropsychological testing.  She understands this may take some time.  I do think she represents a good candidate for stimulator trial.  Greater than 50% of this visit (total duration of  visit 30 minutes) was spent in counseling and coordination of care discussing above.    Meds & Orders: No orders of the defined types were placed in this encounter.   Orders Placed This Encounter  Procedures  . Ambulatory referral to Neuropsychology    Follow-up: Return if symptoms worsen or fail to improve.   Procedures: No procedures performed  No notes on file   Clinical History: MRI LUMBAR SPINE WITHOUT CONTRAST  TECHNIQUE: Multiplanar, multisequence MR imaging of the lumbar spine was performed. No intravenous contrast was administered.  COMPARISON: CT lumbar spine 05/17/2015. MRI lumbar spine 06/05/2014.  FINDINGS: Segmentation: Standard.  Alignment: There is unchanged 0.7 cm retrolisthesis L2 on L3. Convex left scoliosis noted.  Vertebrae: No fracture or worrisome lesion. Degenerative endplate signal change at L2-3 is again seen. The patient is status post L3-S1 fusion.  Conus medullaris and cauda equina: Conus extends to the L1-2 level. Conus and cauda equina appear normal.  Paraspinal and other soft tissues: Negative.  Disc levels:  T11-12 is imaged in the sagittal plane only and negative.  T12-L1: Status post posterior decompression. The central canal and foramina are widely patent.  L1-2: Status post posterior decompression. The central canal and foramina are widely patent.  L2-3: Status post discectomy and fusion. There is some endplate spurring but the central canal and foramina are open. The appearance is unchanged.  L3-4: Status post discectomy and fusion. The central canal and foramina are widely patent.  L4-5: Status post discectomy and fusion. The central canal and neural foramina are widely patent.  L5-S1: Status post discectomy and fusion. There is some endplate spurring but the central canal and foramina are widely patent.  IMPRESSION: The central canal and foramina are open at all levels with postoperative change from T12-S1 as  described above.   Electronically Signed By: Inge Rise M.D. On: 03/11/2017 14:05   She reports that she has never smoked. She has never used smokeless tobacco. No results for input(s): HGBA1C, LABURIC in the last 8760 hours.  Objective:  VS:  HT:5\' 7"  (170.2 cm)   WT:170 lb (77.1 kg)  BMI:26.62    BP:129/84  HR:84bpm  TEMP: ( )  RESP:98 % Physical Exam  Constitutional: She is oriented to person, place, and time. She appears well-developed and well-nourished. No distress.  HENT:  Head: Normocephalic and atraumatic.  Nose: Nose normal.  Mouth/Throat: Oropharynx is clear and moist.  Eyes: Pupils are equal, round, and reactive to light. Conjunctivae are normal.  Neck: Normal range of motion. Neck supple.  Cardiovascular: Regular rhythm and intact distal pulses.  Pulmonary/Chest: Effort normal. No respiratory distress.  Abdominal: Soft. She exhibits no distension. There is no rebound and no guarding.  Musculoskeletal:  Patient ambulates without aid.  She is somewhat slow to rise from a seated position is very stiff the lumbar spine duty fusion.  She has some paraspinal tenderness and tender points across the lower back PSIS and greater trochanters but nothing exquisite.  No pain with hip rotation she has good distal  strength without clonus.  Neurological: She is alert and oriented to person, place, and time. She exhibits normal muscle tone. Coordination normal.  Skin: Skin is warm. No rash noted. No erythema.  Psychiatric: She has a normal mood and affect. Her behavior is normal.  Nursing note and vitals reviewed.   Ortho Exam Imaging: No results found.  Past Medical/Family/Surgical/Social History: Medications & Allergies reviewed per EMR, new medications updated. Patient Active Problem List   Diagnosis Date Noted  . Ileus, postoperative ([place]) 08/13/2014    Class: Acute  . Pseudarthrosis after fusion or arthrodesis 08/09/2014    Class: Chronic  . Painful orthopaedic  hardware ([place]) 08/09/2014    Class: Chronic  . Vulvar cysts 04/26/2014  . Spinal stenosis, lumbar region, with neurogenic claudication 04/07/2013    Class: Chronic  . Degenerative spondylolisthesis 04/07/2013    Class: Chronic  . Spinal stenosis of lumbar region with neurogenic claudication 04/07/2013   Past Medical History:  Diagnosis Date  . Arthritis   . Asthma   . Cancer (HCC)    cervical , skin  . Cold    recent  rx  . Fibromyalgia   . GERD (gastroesophageal reflux disease)   . H/O hiatal hernia   . Hypertension   . PONV (postoperative nausea and vomiting)   . Shortness of breath   . Sleep apnea    ' mild " does not use cpap   History reviewed. No pertinent family history. Past Surgical History:  Procedure Laterality Date  . ABDOMINAL HYSTERECTOMY  04  . APPENDECTOMY    . BACK SURGERY     x3  . CERVICAL CONIZATION W/BX    . CERVICAL DISC SURGERY  06  . CHOLECYSTECTOMY  03  . LUMBAR FUSION  04/07/2013   L 2 L3 L4    with rod     DR NITKA  . TONSILLECTOMY     Social History   Occupational History  . Not on file  Tobacco Use  . Smoking status: Never Smoker  . Smokeless tobacco: Never Used  Substance and Sexual Activity  . Alcohol use: No  . Drug use: No  . Sexual activity: Yes    Birth control/protection: Surgical

## 2017-12-25 ENCOUNTER — Encounter: Payer: Self-pay | Admitting: Psychology

## 2017-12-26 ENCOUNTER — Encounter: Payer: Medicare Other | Attending: Psychology | Admitting: Psychology

## 2017-12-26 DIAGNOSIS — M5416 Radiculopathy, lumbar region: Secondary | ICD-10-CM | POA: Insufficient documentation

## 2017-12-26 DIAGNOSIS — G894 Chronic pain syndrome: Secondary | ICD-10-CM | POA: Insufficient documentation

## 2017-12-26 DIAGNOSIS — N183 Chronic kidney disease, stage 3 (moderate): Secondary | ICD-10-CM | POA: Diagnosis not present

## 2017-12-26 DIAGNOSIS — M961 Postlaminectomy syndrome, not elsewhere classified: Secondary | ICD-10-CM | POA: Insufficient documentation

## 2017-12-26 DIAGNOSIS — M797 Fibromyalgia: Secondary | ICD-10-CM | POA: Diagnosis not present

## 2017-12-26 DIAGNOSIS — G4730 Sleep apnea, unspecified: Secondary | ICD-10-CM | POA: Diagnosis not present

## 2017-12-26 DIAGNOSIS — K219 Gastro-esophageal reflux disease without esophagitis: Secondary | ICD-10-CM | POA: Insufficient documentation

## 2017-12-26 DIAGNOSIS — I10 Essential (primary) hypertension: Secondary | ICD-10-CM | POA: Diagnosis not present

## 2017-12-30 DIAGNOSIS — E782 Mixed hyperlipidemia: Secondary | ICD-10-CM | POA: Diagnosis not present

## 2017-12-30 DIAGNOSIS — I10 Essential (primary) hypertension: Secondary | ICD-10-CM | POA: Diagnosis not present

## 2017-12-30 DIAGNOSIS — K219 Gastro-esophageal reflux disease without esophagitis: Secondary | ICD-10-CM | POA: Diagnosis not present

## 2017-12-30 DIAGNOSIS — E876 Hypokalemia: Secondary | ICD-10-CM | POA: Diagnosis not present

## 2017-12-30 DIAGNOSIS — Z9189 Other specified personal risk factors, not elsewhere classified: Secondary | ICD-10-CM | POA: Diagnosis not present

## 2018-01-02 DIAGNOSIS — I10 Essential (primary) hypertension: Secondary | ICD-10-CM | POA: Diagnosis not present

## 2018-01-02 DIAGNOSIS — Z23 Encounter for immunization: Secondary | ICD-10-CM | POA: Diagnosis not present

## 2018-01-02 DIAGNOSIS — Z0001 Encounter for general adult medical examination with abnormal findings: Secondary | ICD-10-CM | POA: Diagnosis not present

## 2018-01-15 DIAGNOSIS — R05 Cough: Secondary | ICD-10-CM | POA: Diagnosis not present

## 2018-01-15 DIAGNOSIS — J019 Acute sinusitis, unspecified: Secondary | ICD-10-CM | POA: Diagnosis not present

## 2018-01-16 DIAGNOSIS — E876 Hypokalemia: Secondary | ICD-10-CM | POA: Diagnosis not present

## 2018-01-21 ENCOUNTER — Encounter: Payer: Self-pay | Admitting: Psychology

## 2018-01-21 NOTE — Progress Notes (Signed)
Neuropsychological Consultation   Patient:   [name]   DOB:   06-Oct-1955  MR Number:  [id number]  Location:  [facility] [place], [place] [id number] [place] [address] Dept: [phone]           Date of Service:   12/26/2017  Start Time:   3 PM End Time:   4 PM  Provider/Observer:  Ilean Skill, Psy.D.       Clinical Neuropsychologist       Billing Code/Service: Diagnostic clinical interview  Chief Complaint:    [name] is a 62 year old female referred by Dr. Ernestina Patches for psychological evaluation as part of the standard preliminary psychological evaluation for consideration for spinal cord stimulator trialing and possible implantation.  The patient reports as well as medical records document multiple back surgeries and neck surgeries through the years.  The patient is also been diagnosed with fibromyalgia.  The patient's other medical issues include chronic kidney disease stage III.  She reports that she has been cleared medically for the spinal cord stimulator from her nephrologist.  Reason for Service:  The patient was referred for a psychological evaluation is part of the standard protocol and work-up for possible spinal cord stimulator trialing and implantation.  Current Status:  The patient describes chronic pain and difficulties following multiple issues with her back and neck.  The patient denies any psychiatric issues or current issues with depression or anxiety.  Reliability of Information: The information is provided from a face-to-face interview with the patient as well as a review of available medical records.  Behavioral Observation: WINEFRED HILLESHEIM  presents as a 62 y.o.-year-old Right  Female who appeared her stated age. her dress was Appropriate and she was Well Groomed and her manners were Appropriate to the situation.  her  participation was indicative of Appropriate and Attentive behaviors.  There were not any physical disabilities noted.  she displayed an appropriate level of cooperation and motivation.     Interactions:    Active Appropriate and Attentive  Attention:   within normal limits and attention span and concentration were age appropriate  Memory:   within normal limits; recent and remote memory intact  Visuo-spatial:  within normal limits  Speech (Volume):  normal  Speech:   normal; normal  Thought Process:  Coherent and Relevant  Though Content:  WNL; not suicidal and not homicidal  Orientation:   person, place, time/date and situation  Judgment:   Good  Planning:   Good  Affect:    Appropriate  Mood:    Dysphoric  Insight:   Good  Intelligence:   normal  Marital Status/Living: The patient was born and raised in [place] and was 1 of 5 siblings.  The patient was born after normal pregnancy and achieved developmental milestones at the appropriate time.  She currently lives with her boyfriend and has lived together for the past 10 years.  The patient is divorced.  She was married in 1976 and they were married for 5 years.  She has a 61 year old child.  Current Employment: The patient is currently disabled.  Past Employment:  The patient worked in [place] for many years and her longest duration of employment was for 12 years with [employer].  Substance Use:  No concerns of substance abuse are reported.    Education:   HS Graduate  Medical History:   Past Medical History:  Diagnosis  Date  . Arthritis   . Asthma   . Cancer (HCC)    cervical , skin  . Cold    recent  rx  . Fibromyalgia   . GERD (gastroesophageal reflux disease)   . H/O hiatal hernia   . Hypertension   . PONV (postoperative nausea and vomiting)   . Shortness of breath   . Sleep apnea    ' mild " does not use cpap            Psychiatric History:  The patient denies any prior psychiatric history  no current psychiatric symptoms.  Family Med/Psych History: History reviewed. No pertinent family history.  Risk of Suicide/Violence: low the patient denies any suicidal homicidal ideation.  Impression/DX:  Foy [place]. Poteat is a 62 year old female referred by Dr. Ernestina Patches for psychological evaluation as part of the standard preliminary psychological evaluation for consideration for spinal cord stimulator trialing and possible implantation.  The patient reports as well as medical records document multiple back surgeries and neck surgeries through the years.  The patient is also been diagnosed with fibromyalgia.  The patient's other medical issues include chronic kidney disease stage III.  She reports that she has been cleared medically for the spinal cord stimulator from her nephrologist.  The patient describes chronic pain and difficulties following multiple issues with her back and neck.  The patient denies any psychiatric issues or current issues with depression or anxiety.    Disposition/Plan:  We have set the patient up for structured objective psychological evaluation utilizing the [facility] multiphasic personality inventory-2 as well as the pain patient profile (P3).  Once the patient is completed these inventories the formal psychological evaluation will be completed.  Diagnosis:    Lumbar radiculopathy  Post laminectomy syndrome  Fibromyalgia  Chronic pain syndrome         Electronically Signed   _______________________ Ilean Skill, Psy.D.

## 2018-02-04 DIAGNOSIS — R071 Chest pain on breathing: Secondary | ICD-10-CM | POA: Diagnosis not present

## 2018-02-04 DIAGNOSIS — I10 Essential (primary) hypertension: Secondary | ICD-10-CM | POA: Diagnosis not present

## 2018-02-04 DIAGNOSIS — D72829 Elevated white blood cell count, unspecified: Secondary | ICD-10-CM | POA: Diagnosis not present

## 2018-02-04 DIAGNOSIS — J209 Acute bronchitis, unspecified: Secondary | ICD-10-CM | POA: Diagnosis not present

## 2018-02-06 ENCOUNTER — Encounter: Payer: Medicare Other | Attending: Psychology | Admitting: Psychology

## 2018-02-06 ENCOUNTER — Encounter: Payer: Self-pay | Admitting: Psychology

## 2018-02-06 DIAGNOSIS — M797 Fibromyalgia: Secondary | ICD-10-CM | POA: Diagnosis not present

## 2018-02-06 DIAGNOSIS — N183 Chronic kidney disease, stage 3 (moderate): Secondary | ICD-10-CM | POA: Insufficient documentation

## 2018-02-06 DIAGNOSIS — M961 Postlaminectomy syndrome, not elsewhere classified: Secondary | ICD-10-CM | POA: Insufficient documentation

## 2018-02-06 DIAGNOSIS — I10 Essential (primary) hypertension: Secondary | ICD-10-CM | POA: Diagnosis not present

## 2018-02-06 DIAGNOSIS — G4730 Sleep apnea, unspecified: Secondary | ICD-10-CM | POA: Insufficient documentation

## 2018-02-06 DIAGNOSIS — K219 Gastro-esophageal reflux disease without esophagitis: Secondary | ICD-10-CM | POA: Diagnosis not present

## 2018-02-06 DIAGNOSIS — M5416 Radiculopathy, lumbar region: Secondary | ICD-10-CM | POA: Diagnosis not present

## 2018-02-06 DIAGNOSIS — G894 Chronic pain syndrome: Secondary | ICD-10-CM

## 2018-02-06 NOTE — Progress Notes (Signed)
Patient:  [name]   DOB: [birth year]  MR Number: [id number]  Location: [facility]IC[facility] RE[facility]IL[facility] [place], [id number] [place] [id number] Dept: [phone]  Start: 8:30 AM End: 9:30 AM  Provider/Observer:     Edgardo Roys PsyD  Chief Complaint:      Chief Complaint  Patient presents with  . Pain  . Joint Pain    Reason For Service:     Sarah Marcelli. Mendoza is a 62 year old female referred by Dr. Ernestina Patches for psychological evaluation as part of the standard preliminary psychological evaluation for consideration for spinal cord stimulator trialing and possible implantation.  The patient reports as well as medical records document multiple back surgeries and neck surgeries through the years.  The patient is also been diagnosed with fibromyalgia.  The patient's other medical issues include chronic kidney disease stage III.  She reports that she has been cleared medically for the spinal cord stimulator from her nephrologist.  Testing Administered:  The patient was administered the [place] multiphasic personality inventory as well as the pain patient profile (P3).  The patient appeared to fully participate throughout the clinical interview and face-to-face time and did appear to stay focused and try her hardest on the objective psychological test measures.  Participation Level:   Active  Participation Quality:  Appropriate and Attentive      Behavioral Observation:  Well Groomed, Alert, and Appropriate.   Test Results:   Initially, the patient completed the [place] multiphasic personality inventory-II.  The resulting validity scales do suggest that she made some degree of attempts to place herself in a positive light on some variables but the level of this does not appear to be severe or significant and does not invalidate her clinical scales.  The resulting basic/clinical  scales to highlight the patient's elevation in identification of numerous vague and specific physical and health concerns.  These are consistent with the patient's described symptoms and medical history.  The patient denies any significant issues or difficulties with symptoms of depression, anxiety, manic or psychotic features or other more significant psychiatric difficulties.  Further analysis utilizing content scales to highlight the patient is very focused difficulties related to health concerns and overall medical/physical status.  The patient denies any significant anxiety or depressive features and straightforward questioning.  The patient also denies any significant family or social discord.  Further analysis utilizing supplementary scales do suggest that the patient put some effort into repressing and avoiding issues of stress and difficulties.  The patient does not have an elevation on scales associated with vulnerability to substance and alcohol abuse.  The patient also does not have any elevations on subscales related to possible PTSD symptoms.  The patient describes her self is very adjusted and adapted to a wide range of social and interpersonal situations.  The patient also completed the pain patient profile.  Overall, the patient showed significant elevations in somatic/health concerns relative to a community-based normative sample.  However, she was only slightly above the mean for a pain patient population.  The fact that she has other features beyond just her chronic pain with her back including fibromyalgia and stage III kidney disease would possibly explain the number of physical and medical complaints she is describing.  The patient has no significant symptoms of anxiety or depression relative to a community-based normative sample or a chronic pain patient sample.  Summary of Results:   The  results of the current objective psychological evaluation do not suggest any significant  psychiatric illness or psychiatric difficulties.  There were no indications of significant depression or anxiety and the patient remained quite focused on her difficulties being directly related to physical and medical difficulties.  While the patient may be minimizing her repressing some degree of her stress and the impact these medical difficulties are having upon her they are not to the degree that would impair or inhibit accurate interpretation of any benefit she may experience during the trialing phase of a spinal cord stimulator trial.  Impression/Diagnosis:   The results of the current objective psychological evaluation that included both a 1 hour face-to-face clinical interview with the patient as well as objective psychological measures including the [place] multiphasic personality inventory and the pain patient profile do suggest that the patient is a good candidate for spinal cord stimulator trialing and possible implantation from a standpoint of her psychological and psychosocial situation.  The patient does not display any symptoms consistent with significant depression or anxiety or other more disruptive psychiatric illness.  The patient also is denying any types of psychosocial stressors that would complicate accurate interpretation during the trialing phase.  The patient does make some efforts both conscious as well as defense mechanism efforts to repress some of the distress that she may experience both psychologically as well as from physical issues and while these may have some negative impact on her overall level of coping and life in general these are not to a degree that would disrupt the trialing phase or the possible benefits from a spinal cord stimulator trial/implantation.  Diagnosis:    Axis I: Lumbar radiculopathy  Post laminectomy syndrome  Fibromyalgia  Chronic pain syndrome   Ilean Skill, Psy.D. Neuropsychologist

## 2018-02-11 ENCOUNTER — Other Ambulatory Visit (INDEPENDENT_AMBULATORY_CARE_PROVIDER_SITE_OTHER): Payer: Self-pay | Admitting: Physical Medicine and Rehabilitation

## 2018-02-11 DIAGNOSIS — M961 Postlaminectomy syndrome, not elsewhere classified: Secondary | ICD-10-CM

## 2018-02-11 DIAGNOSIS — G894 Chronic pain syndrome: Secondary | ICD-10-CM

## 2018-02-11 DIAGNOSIS — M5416 Radiculopathy, lumbar region: Secondary | ICD-10-CM

## 2018-03-03 ENCOUNTER — Ambulatory Visit: Payer: Medicare Other | Admitting: Psychology

## 2018-03-03 ENCOUNTER — Encounter

## 2018-03-06 ENCOUNTER — Encounter (INDEPENDENT_AMBULATORY_CARE_PROVIDER_SITE_OTHER): Payer: Self-pay | Admitting: Physical Medicine and Rehabilitation

## 2018-03-06 ENCOUNTER — Ambulatory Visit (INDEPENDENT_AMBULATORY_CARE_PROVIDER_SITE_OTHER): Payer: Medicare Other | Admitting: Physical Medicine and Rehabilitation

## 2018-03-06 ENCOUNTER — Ambulatory Visit (INDEPENDENT_AMBULATORY_CARE_PROVIDER_SITE_OTHER): Payer: Self-pay

## 2018-03-06 VITALS — BP 113/73 | HR 60 | Temp 97.9°F

## 2018-03-06 DIAGNOSIS — M5416 Radiculopathy, lumbar region: Secondary | ICD-10-CM | POA: Diagnosis not present

## 2018-03-06 DIAGNOSIS — G894 Chronic pain syndrome: Secondary | ICD-10-CM

## 2018-03-06 DIAGNOSIS — M961 Postlaminectomy syndrome, not elsewhere classified: Secondary | ICD-10-CM

## 2018-03-06 MED ORDER — LIDOCAINE HCL (PF) 1 % IJ SOLN
2.0000 mL | Freq: Once | INTRAMUSCULAR | Status: AC
  Administered 2018-03-06: 2 mL

## 2018-03-06 MED ORDER — CLINDAMYCIN HCL 150 MG PO CAPS: 150.0000 mg | ORAL_CAPSULE | Freq: Three times a day (TID) | ORAL | 0 refills | Status: DC

## 2018-03-06 NOTE — Progress Notes (Signed)
 .  Numeric Pain Rating Scale and Functional Assessment Average Pain 8   In the last MONTH (on 0-10 scale) has pain interfered with the following?  1. General activity like being  able to carry out your everyday physical activities such as walking, climbing stairs, carrying groceries, or moving a chair?  Rating(5)   +Driver, -BT, -Dye Allergies.  

## 2018-03-06 NOTE — Progress Notes (Signed)
[name] - 62 y.o. female MRN [id number]  Date of birth: March 02, 1956  Office Visit Note: Visit Date: 03/06/2018 PCP: Caryl Bis, MD Referred by: Caryl Bis, MD  Subjective: Chief Complaint  Patient presents with  . Lower Back - Pain   HPI: [name] is a 62 y.o. female who comes in today For planned to lead, 30 to contact spinal cord stimulator trial for her chronic worsening low back and bilateral radicular leg pain left more than right as well as bilateral foot pain with some questionable history of neuropathy.  Patient's complete history can be seen in our prior notes for further details and justification.  We have gone to the preauthorization process and patient has had pre-trial neuropsychological evaluation.  She continues to have low back pain left more than right radicular down the leg more but L5 distribution some symptoms in the both feet.  Today she does report some pain in the upper thoracic region some neck pain.  Patient does have concomitant fibromyalgia.  She has had no new trauma since I last seen her.  ROS Otherwise per HPI.  Assessment & Plan: Visit Diagnoses:  1. Lumbar radiculopathy   2. Post laminectomy syndrome   3. Chronic pain syndrome     Plan: No additional findings.   Meds & Orders:  Meds ordered this encounter  Medications  . lidocaine (PF) (XYLOCAINE) 1 % injection 2 mL  . clindamycin (CLEOCIN) 150 MG capsule    Sig: Take 1 capsule (150 mg total) by mouth 3 (three) times daily.    Dispense:  18 capsule    Refill:  0    Orders Placed This Encounter  Procedures  . Spinal Cord Stimulator - Placement  . Spinal Cord Stimulator - Analysis  . XR C-ARM NO REPORT    Follow-up: Return for For lead pull.   Procedures: No procedures performed  Spinal Cord Stimulator Trial  Patient: [name]      Date of Birth: 09-09-1955 MRN: [id number] PCP: Caryl Bis, MD      Visit Date: 03/06/2018   Universal Protocol:      Date/Time: 12/12/191:12 PM  Consent Given By: the patient  Position: PRONE  Additional Comments: Vital signs were monitored before and after the procedure. Patient was prepped and draped in the usual sterile fashion. The correct patient, procedure, and site was verified.   Injection Procedure Details:  Procedure Site One Meds Administered:  Meds ordered this encounter  Medications  . lidocaine (PF) (XYLOCAINE) 1 % injection 2 mL     Location/Site: Lead was placed just to the left of midline with the upper most lead at the top of the T7 vertebral body just at the superior endplate and then spanning 3 vertebral bodies with the inferior lead at the bottom of the T9 vertebral body.  Second lead was placed to the right of midline with the uppermost lead at the mid T8 vertebral body.  Trying to position the lead more superiorly gave the patient some discomfort and despite adequate imaging showing this to be epidural in nature we could not get around the fact that she just had increased pain from this placement.  Stimulating the leads in that position also caused her some thoracic pain.  We did position the right lead caudally and the patient did fine with good stimulation and no pain.  **If the trial is successful and she goes to permanent lead placement I would suggest thoracic MRI.  Needle size: 14 gauge   Needle type: EPIMED RX Coud Epidural Needle  Needle Placement: T11-12 Dorsal epidural space  Findings:  -  -Comments: Excellent paresthesia coverage was obtained in all of the areas of the patient's normal pain  Procedure Details: After localization of the T11-12 epidural space and the overlying soft tissue, the needle was introduced two bodies below this level to produce an adequate angle for epidural penetration. The soft tissue was infiltrated with 2-3 mls. of 1% Lidocaine without Epinephrine. Using the posterolateral approach, the #14 gauge Epimed needle was inserted down to  the posterior aspect of the T12 llamina and then "walked off" the superior aspect of this lamina into the T11-12 epidural space. The epidural space was localized with loss of resistance and negative aspirate for CSF or blood. The [employer] (16) electrode stimulation lead was passed up so that just the superior most lead was at the location noted above while maintaining the lead in the midline.   The above procedure was repeated for a second [place] (16) electrode stimulation lead for the opposite side. The pulse generator system was adjusted and programmed by myself and the company technical representative by varying the stimulator sites, rates, pulse amplitude, and duration. Adequate coverage was obtained as described above.  Subsequent to this, the introducer needle was removed and the spinal cord stimulator trial lead was fastened to the skin using steri-strips and strain reduction loop. The lead wire was then connected and Tegaderm was applied to produce an occlusive dressing. The patient was then brought out of the procedure room and was given a portable stimulator.  The patient will follow-up in three to seven days to determine whether this was efficacious.  Oral prophylactic antibiotic, clindamycin, was prescribed as noted.   Additional Comments:  The patient tolerated the procedure well Dressing: As noted above    Post-procedure details: Patient was observed during the procedure. Post-procedure instructions were reviewed.  Patient left the clinic in stable condition.     Clinical History: MRI LUMBAR SPINE WITHOUT CONTRAST  TECHNIQUE: Multiplanar, multisequence MR imaging of the lumbar spine was performed. No intravenous contrast was administered.  COMPARISON: CT lumbar spine 05/17/2015. MRI lumbar spine 06/05/2014.  FINDINGS: Segmentation: Standard.  Alignment: There is unchanged 0.7 cm retrolisthesis L2 on L3. Convex left  scoliosis noted.  Vertebrae: No fracture or worrisome lesion. Degenerative endplate signal change at L2-3 is again seen. The patient is status post L3-S1 fusion.  Conus medullaris and cauda equina: Conus extends to the L1-2 level. Conus and cauda equina appear normal.  Paraspinal and other soft tissues: Negative.  Disc levels:  T11-12 is imaged in the sagittal plane only and negative.  T12-L1: Status post posterior decompression. The central canal and foramina are widely patent.  L1-2: Status post posterior decompression. The central canal and foramina are widely patent.  L2-3: Status post discectomy and fusion. There is some endplate spurring but the central canal and foramina are open. The appearance is unchanged.  L3-4: Status post discectomy and fusion. The central canal and foramina are widely patent.  L4-5: Status post discectomy and fusion. The central canal and neural foramina are widely patent.  L5-S1: Status post discectomy and fusion. There is some endplate spurring but the central canal and foramina are widely patent.  IMPRESSION: The central canal and foramina are open at all levels with postoperative change from T12-S1 as described above.   Electronically Signed By: Inge Rise M.D. On: 03/11/2017 14:05  She reports that she has never smoked. She has never used smokeless tobacco. No results for input(s): HGBA1C, LABURIC in the last 8760 hours.  Objective:  VS:  HT:    WT:   BMI:     BP:113/73  HR:60bpm  TEMP:97.9 F (36.6 C)(Oral)  RESP:95 % Physical Exam Vitals signs and nursing note reviewed.  Constitutional:      General: She is not in acute distress.    Appearance: Normal appearance. She is well-developed. She is not ill-appearing.  HENT:     Head: Normocephalic and atraumatic.  Eyes:     Conjunctiva/sclera: Conjunctivae normal.     Pupils: Pupils are equal, round, and reactive to light.  Cardiovascular:     Rate and Rhythm:  Normal rate.     Pulses: Normal pulses.  Pulmonary:     Effort: Pulmonary effort is normal.  Musculoskeletal:     Right lower leg: No edema.     Left lower leg: No edema.     Comments: Flat stiff lumbar spine with some pain no longer tender points bilaterally with PSIS and greater trochanters.  Skin:    General: Skin is warm and dry.     Findings: No erythema or rash.  Neurological:     General: No focal deficit present.     Mental Status: She is alert and oriented to person, place, and time.     Sensory: Sensory deficit present.     Motor: No abnormal muscle tone.     Coordination: Coordination normal.     Gait: Gait normal.  Psychiatric:        Mood and Affect: Mood normal.        Behavior: Behavior normal.     Ortho Exam Imaging: Xr C-arm No Report  Result Date: 03/06/2018 Please see Notes tab for imaging impression.   Past Medical/Family/Surgical/Social History: Medications & Allergies reviewed per EMR, new medications updated. Patient Active Problem List   Diagnosis Date Noted  . Ileus, postoperative ([place]) 08/13/2014    Class: Acute  . Pseudarthrosis after fusion or arthrodesis 08/09/2014    Class: Chronic  . Painful orthopaedic hardware ([place]) 08/09/2014    Class: Chronic  . Vulvar cysts 04/26/2014  . Spinal stenosis, lumbar region, with neurogenic claudication 04/07/2013    Class: Chronic  . Degenerative spondylolisthesis 04/07/2013    Class: Chronic  . Spinal stenosis of lumbar region with neurogenic claudication 04/07/2013   Past Medical History:  Diagnosis Date  . Arthritis   . Asthma   . Cancer (HCC)    cervical , skin  . Cold    recent  rx  . Fibromyalgia   . GERD (gastroesophageal reflux disease)   . H/O hiatal hernia   . Hypertension   . PONV (postoperative nausea and vomiting)   . Shortness of breath   . Sleep apnea    ' mild " does not use cpap   History reviewed. No pertinent family history. Past Surgical History:  Procedure  Laterality Date  . ABDOMINAL HYSTERECTOMY  04  . APPENDECTOMY    . BACK SURGERY     x3  . CERVICAL CONIZATION W/BX    . CERVICAL DISC SURGERY  06  . CHOLECYSTECTOMY  03  . LUMBAR FUSION  04/07/2013   L 2 L3 L4    with rod     DR NITKA  . TONSILLECTOMY     Social History   Occupational History  . Not on file  Tobacco Use  .  Smoking status: Never Smoker  . Smokeless tobacco: Never Used  Substance and Sexual Activity  . Alcohol use: No  . Drug use: No  . Sexual activity: Yes    Birth control/protection: Surgical

## 2018-03-06 NOTE — Procedures (Signed)
Spinal Cord Stimulator Trial  Patient: [name]      Date of Birth: [birth year] MRN: [id number] PCP: Caryl Bis, MD      Visit Date: 03/06/2018   Universal Protocol:    Date/Time: 12/12/191:12 PM  Consent Given By: the patient  Position: PRONE  Additional Comments: Vital signs were monitored before and after the procedure. Patient was prepped and draped in the usual sterile fashion. The correct patient, procedure, and site was verified.   Injection Procedure Details:  Procedure Site One Meds Administered:  Meds ordered this encounter  Medications  . lidocaine (PF) (XYLOCAINE) 1 % injection 2 mL     Location/Site: Lead was placed just to the left of midline with the upper most lead at the top of the T7 vertebral body just at the superior endplate and then spanning 3 vertebral bodies with the inferior lead at the bottom of the T9 vertebral body.  Second lead was placed to the right of midline with the uppermost lead at the mid T8 vertebral body.  Trying to position the lead more superiorly gave the patient some discomfort and despite adequate imaging showing this to be epidural in nature we could not get around the fact that she just had increased pain from this placement.  Stimulating the leads in that position also caused her some thoracic pain.  We did position the right lead caudally and the patient did fine with good stimulation and no pain.  **If the trial is successful and she goes to permanent lead placement I would suggest thoracic MRI.   Needle size: 14 gauge   Needle type: EPIMED RX Coud Epidural Needle  Needle Placement: T11-12 Dorsal epidural space  Findings:  -  -Comments: Excellent paresthesia coverage was obtained in all of the areas of the patient's normal pain  Procedure Details: After localization of the T11-12 epidural space and the overlying soft tissue, the needle was introduced two bodies below this level to produce an adequate angle for  epidural penetration. The soft tissue was infiltrated with 2-3 mls. of 1% Lidocaine without Epinephrine. Using the posterolateral approach, the #14 gauge Epimed needle was inserted down to the posterior aspect of the T12 llamina and then "walked off" the superior aspect of this lamina into the T11-12 epidural space. The epidural space was localized with loss of resistance and negative aspirate for CSF or blood. The [employer] (16) electrode stimulation lead was passed up so that just the superior most lead was at the location noted above while maintaining the lead in the midline.   The above procedure was repeated for a second [place] (16) electrode stimulation lead for the opposite side. The pulse generator system was adjusted and programmed by myself and the company technical representative by varying the stimulator sites, rates, pulse amplitude, and duration. Adequate coverage was obtained as described above.  Subsequent to this, the introducer needle was removed and the spinal cord stimulator trial lead was fastened to the skin using steri-strips and strain reduction loop. The lead wire was then connected and Tegaderm was applied to produce an occlusive dressing. The patient was then brought out of the procedure room and was given a portable stimulator.  The patient will follow-up in three to seven days to determine whether this was efficacious.  Oral prophylactic antibiotic, clindamycin, was prescribed as noted.   Additional Comments:  The patient tolerated the procedure well Dressing: As noted above    Post-procedure details: Patient was  observed during the procedure. Post-procedure instructions were reviewed.  Patient left the clinic in stable condition.

## 2018-03-10 ENCOUNTER — Encounter: Payer: Medicare Other | Admitting: Psychology

## 2018-03-12 ENCOUNTER — Ambulatory Visit (INDEPENDENT_AMBULATORY_CARE_PROVIDER_SITE_OTHER): Payer: Medicare Other | Admitting: Physical Medicine and Rehabilitation

## 2018-03-12 DIAGNOSIS — M961 Postlaminectomy syndrome, not elsewhere classified: Secondary | ICD-10-CM

## 2018-03-12 DIAGNOSIS — M5416 Radiculopathy, lumbar region: Secondary | ICD-10-CM

## 2018-03-12 DIAGNOSIS — G894 Chronic pain syndrome: Secondary | ICD-10-CM

## 2018-03-13 ENCOUNTER — Encounter (INDEPENDENT_AMBULATORY_CARE_PROVIDER_SITE_OTHER): Payer: Self-pay | Admitting: Physical Medicine and Rehabilitation

## 2018-03-13 NOTE — Progress Notes (Signed)
[name] - 62 y.o. female MRN [id number]  Date of birth: 07-20-1955  Office Visit Note: Visit Date: 03/12/2018 PCP: Sarah Bis, MD Referred by: Sarah Bis, MD  Subjective: Chief Complaint  Patient presents with  . Lower Back - Pain   HPI: [name] is a 62 y.o. female who comes in today For spinal cord stimulator trial lead pull.  She reports pain of the low back and left leg in particular were more than 50% better during the trial.  Her husband comments that she was having a much easier time walking and was doing much better in that regard.  She reports that the sensations in the feet were still present but better with the stimulator in place.  She does want to proceed with implant.  We will get all of our notes to Dr. Maryjean Mendoza at [facility].  Trial was really uneventful otherwise.  Quick procedure note: Patient was placed prone on the exam table. The external stimulator/generator was unfastened from the leads. The outer Tegaderm was removed from the bandage carefully. There was no noted erythema of the skin or swelling or induration or drainage. There had been some bloody discharge in the gauze pad. Both trial leads were pulled without difficulty and without discomfort. There was no drainage from the insertion site. Band-Aid was applied.  ROS Otherwise per HPI.  Assessment & Plan: Visit Diagnoses:  1. Lumbar radiculopathy   2. Post laminectomy syndrome   3. Chronic pain syndrome     Plan: No additional findings.   Meds & Orders: No orders of the defined types were placed in this encounter.   Orders Placed This Encounter  Procedures  . Ambulatory referral to Anesthesiology    Follow-up: No follow-ups on file.   Procedures: No procedures performed  No notes on file   Clinical History: MRI LUMBAR SPINE WITHOUT CONTRAST  TECHNIQUE: Multiplanar, multisequence MR imaging of the lumbar spine was performed. No intravenous  contrast was administered.  COMPARISON: CT lumbar spine 05/17/2015. MRI lumbar spine 06/05/2014.  FINDINGS: Segmentation: Standard.  Alignment: There is unchanged 0.7 cm retrolisthesis L2 on L3. Convex left scoliosis noted.  Vertebrae: No fracture or worrisome lesion. Degenerative endplate signal change at L2-3 is again seen. The patient is status post L3-S1 fusion.  Conus medullaris and cauda equina: Conus extends to the L1-2 level. Conus and cauda equina appear normal.  Paraspinal and other soft tissues: Negative.  Disc levels:  T11-12 is imaged in the sagittal plane only and negative.  T12-L1: Status post posterior decompression. The central canal and foramina are widely patent.  L1-2: Status post posterior decompression. The central canal and foramina are widely patent.  L2-3: Status post discectomy and fusion. There is some endplate spurring but the central canal and foramina are open. The appearance is unchanged.  L3-4: Status post discectomy and fusion. The central canal and foramina are widely patent.  L4-5: Status post discectomy and fusion. The central canal and neural foramina are widely patent.  L5-S1: Status post discectomy and fusion. There is some endplate spurring but the central canal and foramina are widely patent.  IMPRESSION: The central canal and foramina are open at all levels with postoperative change from T12-S1 as described above.   Electronically Signed By: [name] M.D. On: 03/11/2017 14:05   She reports that she has never smoked. She has never used smokeless tobacco. No results for input(s): HGBA1C, LABURIC in the last 8760 hours.  Objective:  VS:  HT:    WT:   BMI:     BP:   HR: bpm  TEMP: ( )  RESP:  Physical Exam  Ortho Exam Imaging: No results found.  Past Medical/Family/Surgical/Social History: Medications & Allergies reviewed per EMR, new medications updated. Patient Active Problem List   Diagnosis Date  Noted  . Ileus, postoperative ([place]) 08/13/2014    Class: Acute  . Pseudarthrosis after fusion or arthrodesis 08/09/2014    Class: Chronic  . Painful orthopaedic hardware ([place]) 08/09/2014    Class: Chronic  . Vulvar cysts 04/26/2014  . Spinal stenosis, lumbar region, with neurogenic claudication 04/07/2013    Class: Chronic  . Degenerative spondylolisthesis 04/07/2013    Class: Chronic  . Spinal stenosis of lumbar region with neurogenic claudication 04/07/2013   Past Medical History:  Diagnosis Date  . Arthritis   . Asthma   . Cancer (HCC)    cervical , skin  . Cold    recent  rx  . Fibromyalgia   . GERD (gastroesophageal reflux disease)   . H/O hiatal hernia   . Hypertension   . PONV (postoperative nausea and vomiting)   . Shortness of breath   . Sleep apnea    ' mild " does not use cpap   No family history on file. Past Surgical History:  Procedure Laterality Date  . ABDOMINAL HYSTERECTOMY  04  . APPENDECTOMY    . BACK SURGERY     x3  . CERVICAL CONIZATION W/BX    . CERVICAL DISC SURGERY  06  . CHOLECYSTECTOMY  03  . LUMBAR FUSION  04/07/2013   L 2 L3 L4    with rod     DR NITKA  . TONSILLECTOMY     Social History   Occupational History  . Not on file  Tobacco Use  . Smoking status: Never Smoker  . Smokeless tobacco: Never Used  Substance and Sexual Activity  . Alcohol use: No  . Drug use: No  . Sexual activity: Yes    Birth control/protection: Surgical

## 2018-03-17 DIAGNOSIS — J329 Chronic sinusitis, unspecified: Secondary | ICD-10-CM | POA: Diagnosis not present

## 2018-04-01 DIAGNOSIS — R300 Dysuria: Secondary | ICD-10-CM | POA: Diagnosis not present

## 2018-04-01 DIAGNOSIS — M545 Low back pain: Secondary | ICD-10-CM | POA: Diagnosis not present

## 2018-04-08 DIAGNOSIS — Z01818 Encounter for other preprocedural examination: Secondary | ICD-10-CM | POA: Diagnosis not present

## 2018-04-08 DIAGNOSIS — M961 Postlaminectomy syndrome, not elsewhere classified: Secondary | ICD-10-CM | POA: Diagnosis not present

## 2018-04-09 DIAGNOSIS — E782 Mixed hyperlipidemia: Secondary | ICD-10-CM | POA: Diagnosis not present

## 2018-04-09 DIAGNOSIS — N183 Chronic kidney disease, stage 3 (moderate): Secondary | ICD-10-CM | POA: Diagnosis not present

## 2018-04-09 DIAGNOSIS — I10 Essential (primary) hypertension: Secondary | ICD-10-CM | POA: Diagnosis not present

## 2018-04-09 DIAGNOSIS — K219 Gastro-esophageal reflux disease without esophagitis: Secondary | ICD-10-CM | POA: Diagnosis not present

## 2018-04-09 DIAGNOSIS — R42 Dizziness and giddiness: Secondary | ICD-10-CM | POA: Diagnosis not present

## 2018-04-15 DIAGNOSIS — I10 Essential (primary) hypertension: Secondary | ICD-10-CM | POA: Diagnosis not present

## 2018-04-15 DIAGNOSIS — M961 Postlaminectomy syndrome, not elsewhere classified: Secondary | ICD-10-CM | POA: Diagnosis not present

## 2018-04-15 DIAGNOSIS — K219 Gastro-esophageal reflux disease without esophagitis: Secondary | ICD-10-CM | POA: Diagnosis not present

## 2018-04-15 DIAGNOSIS — G43909 Migraine, unspecified, not intractable, without status migrainosus: Secondary | ICD-10-CM | POA: Diagnosis not present

## 2018-04-15 DIAGNOSIS — N183 Chronic kidney disease, stage 3 (moderate): Secondary | ICD-10-CM | POA: Diagnosis not present

## 2018-04-15 DIAGNOSIS — E782 Mixed hyperlipidemia: Secondary | ICD-10-CM | POA: Diagnosis not present

## 2018-04-18 DIAGNOSIS — G894 Chronic pain syndrome: Secondary | ICD-10-CM | POA: Diagnosis not present

## 2018-04-18 DIAGNOSIS — M961 Postlaminectomy syndrome, not elsewhere classified: Secondary | ICD-10-CM | POA: Diagnosis not present

## 2018-04-24 DIAGNOSIS — E782 Mixed hyperlipidemia: Secondary | ICD-10-CM | POA: Diagnosis not present

## 2018-05-01 DIAGNOSIS — Z9689 Presence of other specified functional implants: Secondary | ICD-10-CM | POA: Diagnosis not present

## 2018-05-01 DIAGNOSIS — M961 Postlaminectomy syndrome, not elsewhere classified: Secondary | ICD-10-CM | POA: Diagnosis not present

## 2018-05-01 DIAGNOSIS — G894 Chronic pain syndrome: Secondary | ICD-10-CM | POA: Diagnosis not present

## 2018-05-06 DIAGNOSIS — J329 Chronic sinusitis, unspecified: Secondary | ICD-10-CM | POA: Diagnosis not present

## 2018-05-16 DIAGNOSIS — Z1231 Encounter for screening mammogram for malignant neoplasm of breast: Secondary | ICD-10-CM | POA: Diagnosis not present

## 2018-05-20 DIAGNOSIS — M791 Myalgia, unspecified site: Secondary | ICD-10-CM | POA: Diagnosis not present

## 2018-05-20 DIAGNOSIS — J029 Acute pharyngitis, unspecified: Secondary | ICD-10-CM | POA: Diagnosis not present

## 2018-05-30 DIAGNOSIS — Z9689 Presence of other specified functional implants: Secondary | ICD-10-CM | POA: Diagnosis not present

## 2018-05-30 DIAGNOSIS — R030 Elevated blood-pressure reading, without diagnosis of hypertension: Secondary | ICD-10-CM | POA: Diagnosis not present

## 2018-06-12 DIAGNOSIS — M545 Low back pain: Secondary | ICD-10-CM | POA: Diagnosis not present

## 2018-06-12 DIAGNOSIS — J301 Allergic rhinitis due to pollen: Secondary | ICD-10-CM | POA: Diagnosis not present

## 2018-07-11 DIAGNOSIS — J301 Allergic rhinitis due to pollen: Secondary | ICD-10-CM | POA: Diagnosis not present

## 2018-07-11 DIAGNOSIS — I10 Essential (primary) hypertension: Secondary | ICD-10-CM | POA: Diagnosis not present

## 2018-07-11 DIAGNOSIS — E782 Mixed hyperlipidemia: Secondary | ICD-10-CM | POA: Diagnosis not present

## 2018-07-11 DIAGNOSIS — N183 Chronic kidney disease, stage 3 (moderate): Secondary | ICD-10-CM | POA: Diagnosis not present

## 2018-09-11 DIAGNOSIS — J301 Allergic rhinitis due to pollen: Secondary | ICD-10-CM | POA: Diagnosis not present

## 2018-09-11 DIAGNOSIS — J0101 Acute recurrent maxillary sinusitis: Secondary | ICD-10-CM | POA: Diagnosis not present

## 2018-09-18 DIAGNOSIS — M7062 Trochanteric bursitis, left hip: Secondary | ICD-10-CM | POA: Diagnosis not present

## 2018-09-18 DIAGNOSIS — J301 Allergic rhinitis due to pollen: Secondary | ICD-10-CM | POA: Diagnosis not present

## 2018-10-09 DIAGNOSIS — D72829 Elevated white blood cell count, unspecified: Secondary | ICD-10-CM | POA: Diagnosis not present

## 2018-10-09 DIAGNOSIS — I10 Essential (primary) hypertension: Secondary | ICD-10-CM | POA: Diagnosis not present

## 2018-10-09 DIAGNOSIS — M797 Fibromyalgia: Secondary | ICD-10-CM | POA: Diagnosis not present

## 2018-10-09 DIAGNOSIS — E039 Hypothyroidism, unspecified: Secondary | ICD-10-CM | POA: Diagnosis not present

## 2018-10-09 DIAGNOSIS — E876 Hypokalemia: Secondary | ICD-10-CM | POA: Diagnosis not present

## 2018-10-09 DIAGNOSIS — K219 Gastro-esophageal reflux disease without esophagitis: Secondary | ICD-10-CM | POA: Diagnosis not present

## 2018-10-13 DIAGNOSIS — I10 Essential (primary) hypertension: Secondary | ICD-10-CM | POA: Diagnosis not present

## 2018-10-13 DIAGNOSIS — N183 Chronic kidney disease, stage 3 (moderate): Secondary | ICD-10-CM | POA: Diagnosis not present

## 2018-10-13 DIAGNOSIS — M7062 Trochanteric bursitis, left hip: Secondary | ICD-10-CM | POA: Diagnosis not present

## 2018-10-13 DIAGNOSIS — E782 Mixed hyperlipidemia: Secondary | ICD-10-CM | POA: Diagnosis not present

## 2018-10-13 DIAGNOSIS — E876 Hypokalemia: Secondary | ICD-10-CM | POA: Diagnosis not present

## 2018-12-03 DIAGNOSIS — M797 Fibromyalgia: Secondary | ICD-10-CM | POA: Diagnosis not present

## 2018-12-03 DIAGNOSIS — M545 Low back pain: Secondary | ICD-10-CM | POA: Diagnosis not present

## 2018-12-03 DIAGNOSIS — Z23 Encounter for immunization: Secondary | ICD-10-CM | POA: Diagnosis not present

## 2018-12-12 DIAGNOSIS — R300 Dysuria: Secondary | ICD-10-CM | POA: Diagnosis not present

## 2018-12-24 DIAGNOSIS — E782 Mixed hyperlipidemia: Secondary | ICD-10-CM | POA: Diagnosis not present

## 2018-12-24 DIAGNOSIS — I10 Essential (primary) hypertension: Secondary | ICD-10-CM | POA: Diagnosis not present

## 2019-01-05 DIAGNOSIS — E876 Hypokalemia: Secondary | ICD-10-CM | POA: Diagnosis not present

## 2019-01-05 DIAGNOSIS — D72829 Elevated white blood cell count, unspecified: Secondary | ICD-10-CM | POA: Diagnosis not present

## 2019-01-05 DIAGNOSIS — R5383 Other fatigue: Secondary | ICD-10-CM | POA: Diagnosis not present

## 2019-01-05 DIAGNOSIS — E782 Mixed hyperlipidemia: Secondary | ICD-10-CM | POA: Diagnosis not present

## 2019-01-05 DIAGNOSIS — N183 Chronic kidney disease, stage 3 unspecified: Secondary | ICD-10-CM | POA: Diagnosis not present

## 2019-01-08 DIAGNOSIS — Z0001 Encounter for general adult medical examination with abnormal findings: Secondary | ICD-10-CM | POA: Diagnosis not present

## 2019-01-08 DIAGNOSIS — I10 Essential (primary) hypertension: Secondary | ICD-10-CM | POA: Diagnosis not present

## 2019-01-08 DIAGNOSIS — E876 Hypokalemia: Secondary | ICD-10-CM | POA: Diagnosis not present

## 2019-01-08 DIAGNOSIS — N189 Chronic kidney disease, unspecified: Secondary | ICD-10-CM | POA: Diagnosis not present

## 2019-01-08 DIAGNOSIS — J301 Allergic rhinitis due to pollen: Secondary | ICD-10-CM | POA: Diagnosis not present

## 2019-01-08 DIAGNOSIS — Z1212 Encounter for screening for malignant neoplasm of rectum: Secondary | ICD-10-CM | POA: Diagnosis not present

## 2019-01-16 DIAGNOSIS — J301 Allergic rhinitis due to pollen: Secondary | ICD-10-CM | POA: Diagnosis not present

## 2019-02-18 ENCOUNTER — Other Ambulatory Visit: Payer: Self-pay

## 2019-02-18 ENCOUNTER — Ambulatory Visit: Payer: Medicare Other | Admitting: Allergy & Immunology

## 2019-02-18 ENCOUNTER — Encounter: Payer: Self-pay | Admitting: Allergy & Immunology

## 2019-02-18 VITALS — BP 136/88 | HR 66 | Temp 97.9°F | Resp 18 | Ht 67.7 in | Wt 172.4 lb

## 2019-02-18 DIAGNOSIS — J302 Other seasonal allergic rhinitis: Secondary | ICD-10-CM | POA: Insufficient documentation

## 2019-02-18 DIAGNOSIS — M797 Fibromyalgia: Secondary | ICD-10-CM | POA: Diagnosis not present

## 2019-02-18 DIAGNOSIS — J3089 Other allergic rhinitis: Secondary | ICD-10-CM

## 2019-02-18 DIAGNOSIS — J4520 Mild intermittent asthma, uncomplicated: Secondary | ICD-10-CM | POA: Diagnosis not present

## 2019-02-18 MED ORDER — AZELASTINE-FLUTICASONE 137-50 MCG/ACT NA SUSP: 1.0000 | Freq: Every day | NASAL | 5 refills | Status: DC

## 2019-02-18 NOTE — Patient Instructions (Addendum)
1. Chronic rhinitis - Testing today showed: ragweed, indoor molds, outdoor molds, dust mites, cat, dog and cockroach - Copy of test results provided.  - Avoidance measures provided. - Stop taking: all of the allergy medications that you are on now - Start taking: Xyzal (levocetirizine) 5mg  tablet once daily and Dymista (fluticasone/azelastine) two sprays per nostril 1-2 times daily as needed - You can use an extra dose of the antihistamine, if needed, for breakthrough symptoms.  - Consider nasal saline rinses 1-2 times daily to remove allergens from the nasal cavities as well as help with mucous clearance (this is especially helpful to do before the nasal sprays are given) - Consider allergy shots as a means of long-term control. - Allergy shots "re-train" and "reset" the immune system to ignore environmental allergens and decrease the resulting immune response to those allergens (sneezing, itchy watery eyes, runny nose, nasal congestion, etc).    - Allergy shots improve symptoms in 75-85% of patients.  - We can discuss more at the next appointment if the medications are not working for you. - CPT codes provided for allergy shots.  2. Return in about 3 months (around 05/21/2019). This can be an in-person, a virtual Webex or a telephone follow up visit.   Please inform us of any Emergency Department visits, hospitalizations, or changes in symptoms. Call us before going to the ED for breathing or allergy symptoms since we might be able to fit you in for a sick visit. Feel free to contact us anytime with any questions, problems, or concerns.  It was a pleasure to meet you today!  Websites that have reliable patient information: 1. American Academy of Asthma, Allergy, and Immunology: www.aaaai.org 2. Food Allergy Research and Education (FARE): foodallergy.org 3. Mothers of Asthmatics: http://www.asthmacommunitynetwork.org 4. American College of Allergy, Asthma, and Immunology:  www.acaai.org  Like [name] on [place] and Instagram for our latest updates!      Make sure you are registered to vote! If you have moved or changed any of your contact information, you will need to get this updated before voting!  In some cases, you MAY be able to register to vote online: [email or web address]     Reducing Pollen Exposure  The American Academy of Allergy, Asthma and Immunology suggests the following steps to reduce your exposure to pollen during allergy seasons.    1. Do not hang sheets or clothing out to dry; pollen may collect on these items. 2. Do not mow lawns or spend time around freshly cut grass; mowing stirs up pollen. 3. Keep windows closed at night.  Keep car windows closed while driving. 4. Minimize morning activities outdoors, a time when pollen counts are usually at their highest. 5. Stay indoors as much as possible when pollen counts or humidity is high and on windy days when pollen tends to remain in the air longer. 6. Use air conditioning when possible.  Many air conditioners have filters that trap the pollen spores. 7. Use a HEPA room air filter to remove pollen form the indoor air you breathe.  Control of Mold Allergen   Mold and fungi can grow on a variety of surfaces provided certain temperature and moisture conditions exist.  Outdoor molds grow on plants, decaying vegetation and soil.  The major outdoor mold, Alternaria and Cladosporium, are found in very high numbers during hot and dry conditions.  Generally, a late Summer - Fall peak is seen for common outdoor fungal spores.  Rain will temporarily lower outdoor mold spore count,  but counts rise rapidly when the rainy period ends.  The most important indoor molds are Aspergillus and Penicillium.  Dark, humid and poorly ventilated basements are ideal sites for mold growth.  The next most common sites of mold growth are the bathroom and the kitchen.  Outdoor (Seasonal) Mold  Control  Positive outdoor molds via skin testing: Bipolaris (Helminthsporium), Drechslera (Curvalaria) and Mucor  1. Use air conditioning and keep windows closed 2. Avoid exposure to decaying vegetation. 3. Avoid leaf raking. 4. Avoid grain handling. 5. Consider wearing a face mask if working in moldy areas.   Indoor (Perennial) Mold Control   Positive indoor molds via skin testing: Aspergillus, Penicillium, Fusarium, Aureobasidium (Pullulara) and Rhizopus  1. Maintain humidity below 50%. 2. Clean washable surfaces with 5% bleach solution. 3. Remove sources e.g. contaminated carpets.    Control of Dust Mite Allergen    Dust mites play a major role in allergic asthma and rhinitis.  They occur in environments with high humidity wherever human skin is found.  Dust mites absorb humidity from the atmosphere (ie, they do not drink) and feed on organic matter (including shed human and animal skin).  Dust mites are a microscopic type of insect that you cannot see with the naked eye.  High levels of dust mites have been detected from mattresses, pillows, carpets, upholstered furniture, bed covers, clothes, soft toys and any woven material.  The principal allergen of the dust mite is found in its feces.  A gram of dust may contain 1,000 mites and 250,000 fecal particles.  Mite antigen is easily measured in the air during house cleaning activities.  Dust mites do not bite and do not cause harm to humans, other than by triggering allergies/asthma.    Ways to decrease your exposure to dust mites in your home:  1. Encase mattresses, box springs and pillows with a mite-impermeable barrier or cover   2. Wash sheets, blankets and drapes weekly in hot water (130 F) with detergent and dry them in a dryer on the hot setting.  3. Have the room cleaned frequently with a vacuum cleaner and a damp dust-mop.  For carpeting or rugs, vacuuming with a vacuum cleaner equipped with a high-efficiency particulate air  (HEPA) filter.  The dust mite allergic individual should not be in a room which is being cleaned and should wait 1 hour after cleaning before going into the room. 4. Do not sleep on upholstered furniture (eg, couches).   5. If possible removing carpeting, upholstered furniture and drapery from the home is ideal.  Horizontal blinds should be eliminated in the rooms where the person spends the most time (bedroom, study, television room).  Washable vinyl, roller-type shades are optimal. 6. Remove all non-washable stuffed toys from the bedroom.  Wash stuffed toys weekly like sheets and blankets above.   7. Reduce indoor humidity to less than 50%.  Inexpensive humidity monitors can be purchased at most hardware stores.  Do not use a humidifier as can make the problem worse and are not recommended.  Control of Dog or Cat Allergen  Avoidance is the best way to manage a dog or cat allergy. If you have a dog or cat and are allergic to dog or cats, consider removing the dog or cat from the home. If you have a dog or cat but dont want to find it a new home, or if your family wants a pet even though someone in the household is allergic, here are some strategies  that may help keep symptoms at bay:  1. Keep the pet out of your bedroom and restrict it to only a few rooms. Be advised that keeping the dog or cat in only one room will not limit the allergens to that room. 2. Dont pet, hug or kiss the dog or cat; if you do, wash your hands with soap and water. 3. High-efficiency particulate air (HEPA) cleaners run continuously in a bedroom or living room can reduce allergen levels over time. 4. Regular use of a high-efficiency vacuum cleaner or a central vacuum can reduce allergen levels. 5. Giving your dog or cat a bath at least once a week can reduce airborne allergen.  Control of Cockroach Allergen  Cockroach allergen has been identified as an important cause of acute attacks of asthma, especially in urban  settings.  There are fifty-five species of cockroach that exist in the [place], however only three, the [place], [place] species produce allergen that can affect patients with Asthma.  Allergens can be obtained from fecal particles, egg casings and secretions from cockroaches.    1. Remove food sources. 2. Reduce access to water. 3. Seal access and entry points. 4. Spray runways with 0.5-1% Diazinon or Chlorpyrifos 5. Blow boric acid power under stoves and refrigerator. 6. Place bait stations (hydramethylnon) at feeding sites.  Allergy Shots   Allergies are the result of a chain reaction that starts in the immune system. Your immune system controls how your body defends itself. For instance, if you have an allergy to pollen, your immune system identifies pollen as an invader or allergen. Your immune system overreacts by producing antibodies called Immunoglobulin E (IgE). These antibodies travel to cells that release chemicals, causing an allergic reaction.  The concept behind allergy immunotherapy, whether it is received in the form of shots or tablets, is that the immune system can be desensitized to specific allergens that trigger allergy symptoms. Although it requires time and patience, the payback can be long-term relief.  How Do Allergy Shots Work?  Allergy shots work much like a vaccine. Your body responds to injected amounts of a particular allergen given in increasing doses, eventually developing a resistance and tolerance to it. Allergy shots can lead to decreased, minimal or no allergy symptoms.  There generally are two phases: build-up and maintenance. Build-up often ranges from three to six months and involves receiving injections with increasing amounts of the allergens. The shots are typically given once or twice a week, though more rapid build-up schedules are sometimes used.  The maintenance phase begins when the most effective dose is reached. This dose is  different for each person, depending on how allergic you are and your response to the build-up injections. Once the maintenance dose is reached, there are longer periods between injections, typically two to four weeks.  Occasionally doctors give cortisone-type shots that can temporarily reduce allergy symptoms. These types of shots are different and should not be confused with allergy immunotherapy shots.  Who Can Be Treated with Allergy Shots?  Allergy shots may be a good treatment approach for people with allergic rhinitis (hay fever), allergic asthma, conjunctivitis (eye allergy) or stinging insect allergy.   Before deciding to begin allergy shots, you should consider:   The length of allergy season and the severity of your symptoms  Whether medications and/or changes to your environment can control your symptoms  Your desire to avoid long-term medication use  Time: allergy immunotherapy requires a major time commitment  Cost:  may vary depending on your insurance coverage  Allergy shots for children age 4 and older are effective and often well tolerated. They might prevent the onset of new allergen sensitivities or the progression to asthma.  Allergy shots are not started on patients who are pregnant but can be continued on patients who become pregnant while receiving them. In some patients with other medical conditions or who take certain common medications, allergy shots may be of risk. It is important to mention other medications you talk to your allergist.   When Will I Feel Better?  Some may experience decreased allergy symptoms during the build-up phase. For others, it may take as long as 12 months on the maintenance dose. If there is no improvement after a year of maintenance, your allergist will discuss other treatment options with you.  If you arent responding to allergy shots, it may be because there is not enough dose of the allergen in your vaccine or there are missing  allergens that were not identified during your allergy testing. Other reasons could be that there are high levels of the allergen in your environment or major exposure to non-allergic triggers like tobacco smoke.  What Is the Length of Treatment?  Once the maintenance dose is reached, allergy shots are generally continued for three to five years. The decision to stop should be discussed with your allergist at that time. Some people may experience a permanent reduction of allergy symptoms. Others may relapse and a longer course of allergy shots can be considered.  What Are the Possible Reactions?  The two types of adverse reactions that can occur with allergy shots are local and systemic. Common local reactions include very mild redness and swelling at the injection site, which can happen immediately or several hours after. A systemic reaction, which is less common, affects the entire body or a particular body system. They are usually mild and typically respond quickly to medications. Signs include increased allergy symptoms such as sneezing, a stuffy nose or hives.  Rarely, a serious systemic reaction called anaphylaxis can develop. Symptoms include swelling in the throat, wheezing, a feeling of tightness in the chest, nausea or dizziness. Most serious systemic reactions develop within 30 minutes of allergy shots. This is why it is strongly recommended you wait in your doctors office for 30 minutes after your injections. Your allergist is trained to watch for reactions, and his or her staff is trained and equipped with the proper medications to identify and treat them.  Who Should Administer Allergy Shots?  The preferred location for receiving shots is your prescribing allergists office. Injections can sometimes be given at another facility where the physician and staff are trained to recognize and treat reactions, and have received instructions by your prescribing allergist.

## 2019-02-18 NOTE — Progress Notes (Signed)
NEW PATIENT  Date of Service/Encounter:  02/18/19  Referring provider: Caryl Bis, MD   Assessment:   Seasonal and perennial allergic rhinitis (ragweed, indoor molds, outdoor molds, dust mites, cat, dog and cockroach)  Intermittent asthma, uncomplicated - controlled with albuterol as needed  Complicated past medical history, including fibromyalgia with multiple flares  Disabled status - due to multiple back surgeries   Plan/Recommendations:   1. Chronic rhinitis - Testing today showed: ragweed, indoor molds, outdoor molds, dust mites, cat, dog and cockroach - Copy of test results provided.  - Avoidance measures provided. - Stop taking: all of the allergy medications that you are on now - Start taking: Xyzal (levocetirizine) 5mg  tablet once daily and Dymista (fluticasone/azelastine) two sprays per nostril 1-2 times daily as needed - You can use an extra dose of the antihistamine, if needed, for breakthrough symptoms.  - Consider nasal saline rinses 1-2 times daily to remove allergens from the nasal cavities as well as help with mucous clearance (this is especially helpful to do before the nasal sprays are given) - Consider allergy shots as a means of long-term control. - Allergy shots "re-train" and "reset" the immune system to ignore environmental allergens and decrease the resulting immune response to those allergens (sneezing, itchy watery eyes, runny nose, nasal congestion, etc).    - Allergy shots improve symptoms in 75-85% of patients.  - We can discuss more at the next appointment if the medications are not working for you. - CPT codes provided for allergy shots.  2. Return in about 3 months (around 05/21/2019). This can be an in-person, a virtual Webex or a telephone follow up visit.   Subjective:   [name] is a 64 y.o. female presenting today for evaluation of  Chief Complaint  Patient presents with  . Allergies    [name] has a history  of the following: Patient Active Problem List   Diagnosis Date Noted  . Seasonal and perennial allergic rhinitis 02/18/2019  . Ileus, postoperative ([place]) 08/13/2014    Class: Acute  . Pseudarthrosis after fusion or arthrodesis 08/09/2014    Class: Chronic  . Painful orthopaedic hardware ([place]) 08/09/2014    Class: Chronic  . Vulvar cysts 04/26/2014  . Spinal stenosis, lumbar region, with neurogenic claudication 04/07/2013    Class: Chronic  . Degenerative spondylolisthesis 04/07/2013    Class: Chronic  . Spinal stenosis of lumbar region with neurogenic claudication 04/07/2013    History obtained from: chart review and patient.  [name] was referred by Sarah Bis, MD.     Virgle is a 63 y.o. female presenting for an evaluation of environmental allergies. She reports that her allergies make her fibromyalgia "flare up". She had testing done years ago and was never on shots. She currently takes cetirizine 10mg  daily as well as montelukast 10mg . She has been on Allegra without much relief. Testing was done a "long time ago". It was somewhere in [place]. She is unsure of the name but it sounds like it was with our practice. We do not have her previous records, therefore it must have been longer than 10 years ago.   She does report some asthma. She does have an inhaler which she does not use often. She does not get prednisone. She has never been to the hospital for her breathing. ACT is 25 today, indicating excellent asthma control. She has not needed any ED visits for hospitalizations for her breathing.    Otherwise, there  is no history of other atopic diseases, including food allergies, drug allergies, stinging insect allergies, eczema, urticaria or contact dermatitis. There is no significant infectious history. Vaccinations are up to date.    Past Medical History: Patient Active Problem List   Diagnosis Date Noted  . Seasonal and perennial allergic rhinitis 02/18/2019  .  Ileus, postoperative ([place]) 08/13/2014    Class: Acute  . Pseudarthrosis after fusion or arthrodesis 08/09/2014    Class: Chronic  . Painful orthopaedic hardware ([place]) 08/09/2014    Class: Chronic  . Vulvar cysts 04/26/2014  . Spinal stenosis, lumbar region, with neurogenic claudication 04/07/2013    Class: Chronic  . Degenerative spondylolisthesis 04/07/2013    Class: Chronic  . Spinal stenosis of lumbar region with neurogenic claudication 04/07/2013    Medication List:  Allergies as of 02/18/2019      Reactions   Ace Inhibitors Cough   Asa [aspirin] Nausea And Vomiting   Cephalosporins Nausea And Vomiting   Doxycycline Nausea And Vomiting   Morphine And Related    High blood pressure    Novocain [procaine] Swelling   Other Nausea And Vomiting   Sensitive to pain medications   Statins Other (See Comments)   myalgia   Sulfa Antibiotics Hives, Nausea And Vomiting   Tricyclic Antidepressants Nausea And Vomiting   Cymbalta and alike meds   Ultram [tramadol] Other (See Comments)   myalgia   Zetia [ezetimibe]    Muscle Aches   Codeine Hives, Palpitations   Hydrocodone, Ultram      Medication List       Accurate as of February 18, 2019  2:14 PM. If you have any questions, ask your nurse or doctor.        STOP taking these medications   loratadine 10 MG tablet Commonly known as: CLARITIN Stopped by: Valentina Shaggy, MD     TAKE these medications   Azelastine-Fluticasone 137-50 MCG/ACT Susp Commonly known as: Dymista Place 1-2 sprays into both nostrils daily. Started by: Valentina Shaggy, MD   buPROPion 150 MG 24 hr tablet Commonly known as: WELLBUTRIN XL   clindamycin 150 MG capsule Commonly known as: CLEOCIN Take 1 capsule (150 mg total) by mouth 3 (three) times daily.   clonazePAM 0.5 MG tablet Commonly known as: KLONOPIN Take 0.5 mg by mouth at bedtime.   diltiazem 240 MG 24 hr capsule Commonly known as: CARDIZEM CD Take 240 mg by mouth  daily.   diphenhydrAMINE 25 mg capsule Commonly known as: BENADRYL Take 1 capsule (25 mg total) by mouth every 8 (eight) hours as needed for itching.   estradiol 0.5 MG tablet Commonly known as: ESTRACE Take 0.5 mg by mouth daily.   fluticasone 110 MCG/ACT inhaler Commonly known as: FLOVENT HFA Inhale 1 puff into the lungs as needed.   lidocaine 5 % Commonly known as: LIDODERM Place 1 patch onto the skin daily. Remove & Discard patch within 12 hours or as directed by MD   losartan-hydrochlorothiazide 50-12.5 MG tablet Commonly known as: HYZAAR   meloxicam 15 MG tablet Commonly known as: MOBIC Take 15 mg by mouth daily.   Metoprolol-Hydrochlorothiazide 50-12.5 MG Tb24   montelukast 10 MG tablet Commonly known as: SINGULAIR   ondansetron 4 MG tablet Commonly known as: Zofran Take 1 tablet (4 mg total) by mouth every 8 (eight) hours as needed for nausea or vomiting.   pantoprazole 40 MG tablet Commonly known as: PROTONIX Take 40 mg by mouth 2 (two) times daily.  polyethylene glycol 17 g packet Commonly known as: MIRALAX / GLYCOLAX Take 17 g by mouth daily.   potassium chloride SA 20 MEQ tablet Commonly known as: KLOR-CON Take 20 mEq by mouth 2 (two) times daily.   promethazine 25 MG tablet Commonly known as: PHENERGAN Take 25 mg by mouth. Take 1/2 tablet Q 4 hours as needed       Birth History: non-contributory  Developmental History: non-contributory  Past Surgical History: Past Surgical History:  Procedure Laterality Date  . ABDOMINAL HYSTERECTOMY  04  . APPENDECTOMY    . BACK SURGERY     x3  . CERVICAL CONIZATION W/BX    . CERVICAL DISC SURGERY  06  . CHOLECYSTECTOMY  03  . LUMBAR FUSION  04/07/2013   L 2 L3 L4    with rod     DR NITKA  . TONSILLECTOMY       Family History: Family History  Problem Relation Age of Onset  . Allergic rhinitis Neg Hx   . Angioedema Neg Hx   . Asthma Neg Hx   . Atopy Neg Hx   . Eczema Neg Hx   .  Immunodeficiency Neg Hx   . Urticaria Neg Hx      Social History: Whitney lives in a mobile home. She has had four back surgeries and has rods placed. She has been on disability for 9 years. Prior to that, she worked at [employer].  She lives in a mobile home that is 64 years old.  There is carpeting throughout the home.  She has electric heating and central cooling.  There are dust mite covers on the bed, but not the pillows.  There is no tobacco exposure.   Review of Systems  Constitutional: Negative.  Negative for chills, fever, malaise/fatigue and weight loss.  HENT: Negative.  Negative for congestion, ear discharge, ear pain and sore throat.   Eyes: Negative for pain, discharge and redness.  Respiratory: Negative for cough, sputum production, shortness of breath and wheezing.   Cardiovascular: Negative.  Negative for chest pain and palpitations.  Gastrointestinal: Negative for abdominal pain, heartburn, nausea and vomiting.  Musculoskeletal: Positive for back pain, joint pain, myalgias and neck pain.  Skin: Negative.  Negative for itching and rash.  Neurological: Negative for dizziness and headaches.  Endo/Heme/Allergies: Positive for environmental allergies. Does not bruise/bleed easily.       Objective:   Blood pressure 136/88, pulse 66, temperature 97.9 F (36.6 C), temperature source Temporal, resp. rate 18, height 5' 7.7" (1.72 m), weight 172 lb 6.4 oz (78.2 kg), SpO2 95 %. Body mass index is 26.45 kg/m.   Physical Exam:   Physical Exam  Constitutional: She appears well-developed.  Talkative.  HENT:  Head: Normocephalic and atraumatic.  Right Ear: Tympanic membrane, external ear and ear canal normal. No drainage, swelling or tenderness. Tympanic membrane is not injected, not scarred, not erythematous, not retracted and not bulging.  Left Ear: Tympanic membrane, external ear and ear canal normal. No drainage, swelling or tenderness. Tympanic membrane is not injected, not  scarred, not erythematous, not retracted and not bulging.  Nose: No mucosal edema, rhinorrhea, nasal deformity or septal deviation. No epistaxis. Right sinus exhibits no maxillary sinus tenderness and no frontal sinus tenderness. Left sinus exhibits no maxillary sinus tenderness and no frontal sinus tenderness.  Mouth/Throat: Uvula is midline and oropharynx is clear and moist. Mucous membranes are not pale and not dry.  Turbinates enlarged bilaterally without exudates.   Eyes: Pupils are  equal, round, and reactive to light. Conjunctivae and EOM are normal. Right eye exhibits no chemosis and no discharge. Left eye exhibits no chemosis and no discharge. Right conjunctiva is not injected. Left conjunctiva is not injected.  Cardiovascular: Normal rate, regular rhythm and normal heart sounds.  Respiratory: Effort normal and breath sounds normal. No accessory muscle usage. No tachypnea. No respiratory distress. She has no wheezes. She has no rhonchi. She has no rales. She exhibits no tenderness.  Moving air well in all lung fields. No wheezes or crackles.  GI: There is no abdominal tenderness. There is no rebound and no guarding.  Lymphadenopathy:       Head (right side): No submandibular, no tonsillar and no occipital adenopathy present.       Head (left side): No submandibular, no tonsillar and no occipital adenopathy present.    She has no cervical adenopathy.  Neurological: She is alert.  Skin: No abrasion, no petechiae and no rash noted. Rash is not papular, not vesicular and not urticarial. No erythema. No pallor.  No urticarial or eczematous lesions noted.   Psychiatric: She has a normal mood and affect.     Diagnostic studies:   Allergy Studies:    Airborne Adult Perc - 02/18/19 1119    Time Antigen Placed  1119    Allergen Manufacturer  Lavella Hammock    Location  Back    Number of Test  59    Panel 1  Select    1. Control-Buffer 50% Glycerol  Negative    2. Control-Histamine 1 mg/ml  2+     3. Albumin saline  Negative    4. [place]  Negative    5. [place]  Negative    6. Johnson  Negative    7. [place] Blue  Negative    8. Meadow Fescue  Negative    9. Perennial Rye  Negative    10. Sweet Vernal  Negative    11. Timothy  Negative    12. Cocklebur  Negative    13. Burweed Marshelder  Negative    14. Ragweed, short  Negative    15. Ragweed, Giant  Negative    16. Plantain,  English  Negative    17. Lamb's Quarters  Negative    18. Sheep Sorrell  Negative    19. Rough Pigweed  Negative    20. Marsh Elder, Rough  Negative    21. Mugwort, Common  Negative    22. Ash mix  Negative    23. Birch mix  Negative    24. Beech American  Negative    25. Box, Elder  Negative    26. Cedar, red  Negative    27. Cottonwood, [place]  Negative    28. Elm mix  Negative    29. Hickory mix  Negative    30. Maple mix  Negative    31. Oak, [place] mix  Negative    32. Pecan Pollen  Negative    33. Pine mix  Negative    34. Sycamore Eastern  Negative    35. [place], Black Pollen  Negative    36. Alternaria alternata  Negative    37. Cladosporium Herbarum  Negative    38. Aspergillus mix  Negative    39. Penicillium mix  Negative    40. Bipolaris sorokiniana (Helminthosporium)  Negative    41. Drechslera spicifera (Curvularia)  Negative    42. Mucor plumbeus  Negative    43. Fusarium moniliforme  Negative  44. Aureobasidium pullulans (pullulara)  Negative    45. Rhizopus oryzae  Negative    46. Botrytis cinera  Negative    47. Epicoccum nigrum  Negative    48. Phoma betae  Negative    49. Candida Albicans  Negative    50. Trichophyton mentagrophytes  Negative    51. Mite, D Farinae  5,000 AU/ml  Negative    52. Mite, D Pteronyssinus  5,000 AU/ml  Negative    53. Cat Hair 10,000 BAU/ml  Negative    54.  Dog Epithelia  Negative    55. Mixed Feathers  Negative    56. Horse Epithelia  Negative    57. Cockroach, German  Negative    58. Mouse  Negative    59. Tobacco Leaf   Negative     Food Perc - 02/18/19 1119    Allergen Manufacturer  Lavella Hammock    Location  Back    Number of allergen test  10    Food  Select    1. Peanut  Negative    2. Soybean food  Negative    3. Wheat, whole  Negative    4. Sesame  Negative    5. Milk, cow  Negative    6. Egg White, chicken  Negative    7. Casein  Negative    8. Shellfish mix  Negative    9. Fish mix  Negative    10. Cashew  Negative     Intradermal - 02/18/19 1219    Allergen Manufacturer  Lavella Hammock    Location  Back    Number of Test  15    Intradermal  Select    Control  Negative    Guatemala  Negative    Johnson  Negative    7 Grass  Negative    Ragweed mix  1+    Weed mix  Negative    Tree mix  Negative    Mold 1  Negative    Mold 2  2+    Mold 3  2+    Mold 4  3+    Cat  1+    Dog  1+    Cockroach  1+    Mite mix  1+       Allergy testing results were read and interpreted by myself, documented by clinical staff.         Salvatore Marvel, MD [facility]

## 2019-02-25 ENCOUNTER — Telehealth: Payer: Self-pay | Admitting: Allergy & Immunology

## 2019-02-25 MED ORDER — PREDNISONE 10 MG PO TABS: ORAL_TABLET | ORAL | 0 refills | Status: DC

## 2019-02-25 NOTE — Telephone Encounter (Signed)
Patient called stating that the allergy medication and spray that was prescribed is not working and that she would like more recommendations on medication to help.  Please advise.

## 2019-02-25 NOTE — Telephone Encounter (Signed)
Called and talked to patient and advised treatment plan. Prednisone has been sent to requested pharmacy. Patient will call back for an update if symptoms have not improved.

## 2019-02-25 NOTE — Telephone Encounter (Signed)
Send in a course of prednisone to help kick start her healing process.  Please ask her to double up on the [place] as well. Script pended. Please sign for me when you confirm her pharmacy.  a Salvatore Marvel, MD [facility] [place]

## 2019-02-25 NOTE — Telephone Encounter (Signed)
Dr. Ernst Bowler please advise. Patient is currently taking Xyzal and Dymista.

## 2019-03-25 DIAGNOSIS — M797 Fibromyalgia: Secondary | ICD-10-CM | POA: Diagnosis not present

## 2019-04-08 DIAGNOSIS — K219 Gastro-esophageal reflux disease without esophagitis: Secondary | ICD-10-CM | POA: Diagnosis not present

## 2019-04-08 DIAGNOSIS — R946 Abnormal results of thyroid function studies: Secondary | ICD-10-CM | POA: Diagnosis not present

## 2019-04-08 DIAGNOSIS — M797 Fibromyalgia: Secondary | ICD-10-CM | POA: Diagnosis not present

## 2019-04-08 DIAGNOSIS — R5383 Other fatigue: Secondary | ICD-10-CM | POA: Diagnosis not present

## 2019-04-08 DIAGNOSIS — I10 Essential (primary) hypertension: Secondary | ICD-10-CM | POA: Diagnosis not present

## 2019-04-08 DIAGNOSIS — N183 Chronic kidney disease, stage 3 unspecified: Secondary | ICD-10-CM | POA: Diagnosis not present

## 2019-04-14 DIAGNOSIS — I10 Essential (primary) hypertension: Secondary | ICD-10-CM | POA: Diagnosis not present

## 2019-04-14 DIAGNOSIS — M797 Fibromyalgia: Secondary | ICD-10-CM | POA: Diagnosis not present

## 2019-04-14 DIAGNOSIS — G43909 Migraine, unspecified, not intractable, without status migrainosus: Secondary | ICD-10-CM | POA: Diagnosis not present

## 2019-04-14 DIAGNOSIS — R4582 Worries: Secondary | ICD-10-CM | POA: Diagnosis not present

## 2019-04-14 DIAGNOSIS — E876 Hypokalemia: Secondary | ICD-10-CM | POA: Diagnosis not present

## 2019-04-16 NOTE — Addendum Note (Signed)
Addended by: Valentina Shaggy on: 04/16/2019 02:20 PM   Modules accepted: Orders

## 2019-04-20 NOTE — Progress Notes (Signed)
VIALS EXP 04-19-20 

## 2019-04-21 DIAGNOSIS — J3081 Allergic rhinitis due to animal (cat) (dog) hair and dander: Secondary | ICD-10-CM

## 2019-04-22 DIAGNOSIS — J3089 Other allergic rhinitis: Secondary | ICD-10-CM | POA: Diagnosis not present

## 2019-05-08 ENCOUNTER — Telehealth: Payer: Self-pay

## 2019-05-08 ENCOUNTER — Ambulatory Visit (INDEPENDENT_AMBULATORY_CARE_PROVIDER_SITE_OTHER): Payer: Medicare Other

## 2019-05-08 ENCOUNTER — Other Ambulatory Visit: Payer: Self-pay

## 2019-05-08 DIAGNOSIS — J309 Allergic rhinitis, unspecified: Secondary | ICD-10-CM

## 2019-05-08 MED ORDER — EPINEPHRINE 0.3 MG/0.3ML IJ SOAJ: 0.3000 mg | Freq: Once | INTRAMUSCULAR | 2 refills | Status: AC

## 2019-05-08 NOTE — Telephone Encounter (Signed)
Patient came in to start her allergy injections. Patient stated that she is not able to get here weekly and thinks it would be better to see if her PCP would administer injections. Forms have been filled out and faxed to [facility] Dr. Olena Heckle.

## 2019-05-08 NOTE — Progress Notes (Signed)
Immunotherapy   Patient Details  Name: [name] MRN: [id number] Date of Birth: [birth year]  05/08/2019  [name] started allergy injections today. Patient received 0.05 out of both her blue vials. One with C-D-RW and the other with Molds-DR-DM. Patient waited 30 minutes in an exam room with no problems. Patient is wanting her PCP to administer the injections since she lives 45 minutes away and would not be able to get here weekly. Paperwork faxed to her PCP Dr. Olena Heckle. Following schedule: B Frequency: 1-2 times weekly Epi-Pen: Epi pen was sent to pharmacy. Consent signed and patient instructions given.   Herbie Drape 05/08/2019, 9:31 AM

## 2019-05-12 NOTE — Telephone Encounter (Signed)
Patient called to let us know that Dr. Olena Heckle' never received the paperwork. The correct fax number is [phone].

## 2019-05-13 ENCOUNTER — Ambulatory Visit (INDEPENDENT_AMBULATORY_CARE_PROVIDER_SITE_OTHER): Payer: Medicare Other

## 2019-05-13 DIAGNOSIS — J309 Allergic rhinitis, unspecified: Secondary | ICD-10-CM | POA: Diagnosis not present

## 2019-05-13 NOTE — Progress Notes (Signed)
Immunotherapy   Patient Details  Name: [name] MRN: [id number] Date of Birth: [birth year]  05/13/2019  [name] came in for injection and we have signed paperwork stating she can get her injections at her PCP office. Package vials up and patient took vials with her to take to her PCP office. Following schedule: B  Frequency:1-2 times weekly Epi-Pen:Yes  Consent signed and patient instructions given.   Isabel Caprice 05/13/2019, 4:43 PM

## 2019-05-13 NOTE — Telephone Encounter (Signed)
Forms were received. Patient has taken vials out today to [facility]. Patient is aware she will need to come get her first injection out of her new vials.

## 2019-05-20 ENCOUNTER — Ambulatory Visit: Payer: Medicare Other | Admitting: Allergy & Immunology

## 2019-05-20 DIAGNOSIS — Z516 Encounter for desensitization to allergens: Secondary | ICD-10-CM | POA: Diagnosis not present

## 2019-05-27 DIAGNOSIS — Z516 Encounter for desensitization to allergens: Secondary | ICD-10-CM | POA: Diagnosis not present

## 2019-06-03 DIAGNOSIS — Z516 Encounter for desensitization to allergens: Secondary | ICD-10-CM | POA: Diagnosis not present

## 2019-06-05 ENCOUNTER — Ambulatory Visit: Payer: Medicare Other | Attending: Internal Medicine

## 2019-06-05 DIAGNOSIS — Z23 Encounter for immunization: Secondary | ICD-10-CM

## 2019-06-05 NOTE — Progress Notes (Signed)
   Covid-19 Vaccination Clinic  Name:  [name]    MRN: [id number] DOB: 1955-10-25  06/05/2019  [name] was observed post Covid-19 immunization for 15 minutes without incident. She was provided with Vaccine Information Sheet and instruction to access the V-Safe system.   [name] was instructed to call 911 with any severe reactions post vaccine: Marland Kitchen Difficulty breathing  . Swelling of face and throat  . A fast heartbeat  . A bad rash all over body  . Dizziness and weakness   Immunizations Administered    Name Date Dose VIS Date Route   Pfizer COVID-19 Vaccine 06/05/2019  8:19 AM 0.3 mL 03/06/2019 Intramuscular   Manufacturer: [place]   Lot: [id number]   [place]: [id number]

## 2019-06-10 DIAGNOSIS — Z516 Encounter for desensitization to allergens: Secondary | ICD-10-CM | POA: Diagnosis not present

## 2019-06-12 DIAGNOSIS — M7062 Trochanteric bursitis, left hip: Secondary | ICD-10-CM | POA: Diagnosis not present

## 2019-06-12 DIAGNOSIS — M545 Low back pain: Secondary | ICD-10-CM | POA: Diagnosis not present

## 2019-06-17 DIAGNOSIS — Z516 Encounter for desensitization to allergens: Secondary | ICD-10-CM | POA: Diagnosis not present

## 2019-06-24 DIAGNOSIS — Z516 Encounter for desensitization to allergens: Secondary | ICD-10-CM | POA: Diagnosis not present

## 2019-06-29 ENCOUNTER — Ambulatory Visit: Payer: Medicare Other | Attending: Internal Medicine

## 2019-06-29 DIAGNOSIS — Z23 Encounter for immunization: Secondary | ICD-10-CM

## 2019-06-29 NOTE — Progress Notes (Signed)
   Covid-19 Vaccination Clinic  Name:  [name]    MRN: [id number] DOB: 27-Sep-1955  06/29/2019  [name] was observed post Covid-19 immunization for 15 minutes without incident. She was provided with Vaccine Information Sheet and instruction to access the V-Safe system.   [name] was instructed to call 911 with any severe reactions post vaccine: Marland Kitchen Difficulty breathing  . Swelling of face and throat  . A fast heartbeat  . A bad rash all over body  . Dizziness and weakness   Immunizations Administered    Name Date Dose VIS Date Route   Pfizer COVID-19 Vaccine 06/29/2019 10:58 AM 0.3 mL 03/06/2019 Intramuscular   Manufacturer: [place]   Lot: [id number]   [place]: [id number]

## 2019-07-01 ENCOUNTER — Ambulatory Visit (INDEPENDENT_AMBULATORY_CARE_PROVIDER_SITE_OTHER): Payer: Medicare Other

## 2019-07-01 DIAGNOSIS — J309 Allergic rhinitis, unspecified: Secondary | ICD-10-CM

## 2019-07-01 NOTE — Progress Notes (Signed)
Immunotherapy   Patient Details  Name: [name] MRN: [id number] Date of Birth: Feb 05, 1956  07/01/2019  Patient picked up vials today. Patient received .05 from Gold vial C-D-RW and .05 from Gold vial Molds-CR-DM in office today and vials were packed up for transport to her PCP where she receives her injections. Patient will follow Schedule B and may receive injections 1-2 times weekly. Consent on file and patient instructions given verbally and provided in written form for her PCP. Patient waited 30 minutes prior to leaving. No adverse reaction noted.   Cathi Roan 07/01/2019, 9:35 AM

## 2019-07-02 DIAGNOSIS — E876 Hypokalemia: Secondary | ICD-10-CM | POA: Diagnosis not present

## 2019-07-02 DIAGNOSIS — K219 Gastro-esophageal reflux disease without esophagitis: Secondary | ICD-10-CM | POA: Diagnosis not present

## 2019-07-02 DIAGNOSIS — N183 Chronic kidney disease, stage 3 unspecified: Secondary | ICD-10-CM | POA: Diagnosis not present

## 2019-07-02 DIAGNOSIS — I10 Essential (primary) hypertension: Secondary | ICD-10-CM | POA: Diagnosis not present

## 2019-07-08 ENCOUNTER — Ambulatory Visit: Payer: Medicare Other | Admitting: Family Medicine

## 2019-07-08 ENCOUNTER — Ambulatory Visit: Payer: Medicare Other | Admitting: Allergy & Immunology

## 2019-07-08 DIAGNOSIS — Z516 Encounter for desensitization to allergens: Secondary | ICD-10-CM | POA: Diagnosis not present

## 2019-07-09 DIAGNOSIS — I10 Essential (primary) hypertension: Secondary | ICD-10-CM | POA: Diagnosis not present

## 2019-07-09 DIAGNOSIS — M7062 Trochanteric bursitis, left hip: Secondary | ICD-10-CM | POA: Diagnosis not present

## 2019-07-09 DIAGNOSIS — E876 Hypokalemia: Secondary | ICD-10-CM | POA: Diagnosis not present

## 2019-07-09 DIAGNOSIS — G43909 Migraine, unspecified, not intractable, without status migrainosus: Secondary | ICD-10-CM | POA: Diagnosis not present

## 2019-07-09 DIAGNOSIS — E782 Mixed hyperlipidemia: Secondary | ICD-10-CM | POA: Diagnosis not present

## 2019-07-09 DIAGNOSIS — R4582 Worries: Secondary | ICD-10-CM | POA: Diagnosis not present

## 2019-07-15 DIAGNOSIS — Z516 Encounter for desensitization to allergens: Secondary | ICD-10-CM | POA: Diagnosis not present

## 2019-07-21 DIAGNOSIS — E876 Hypokalemia: Secondary | ICD-10-CM | POA: Diagnosis not present

## 2019-07-22 DIAGNOSIS — Z516 Encounter for desensitization to allergens: Secondary | ICD-10-CM | POA: Diagnosis not present

## 2019-07-29 DIAGNOSIS — Z516 Encounter for desensitization to allergens: Secondary | ICD-10-CM | POA: Diagnosis not present

## 2019-08-05 DIAGNOSIS — Z516 Encounter for desensitization to allergens: Secondary | ICD-10-CM | POA: Diagnosis not present

## 2019-08-06 DIAGNOSIS — M797 Fibromyalgia: Secondary | ICD-10-CM | POA: Diagnosis not present

## 2019-08-12 ENCOUNTER — Ambulatory Visit (INDEPENDENT_AMBULATORY_CARE_PROVIDER_SITE_OTHER): Payer: Medicare Other

## 2019-08-12 DIAGNOSIS — J309 Allergic rhinitis, unspecified: Secondary | ICD-10-CM | POA: Diagnosis not present

## 2019-08-12 NOTE — Progress Notes (Signed)
Immunotherapy   Patient Details  Name: [name] MRN: [id number] Date of Birth: 1955/11/05  08/12/2019   Patient picked up vials today. Patient received .05 from Green vial C-D-RW and .05 from Green vial Molds-CR-DM in office today and vials were packed up for transport to her PCP where she receives her injections. Patient will follow Schedule B and may receive injections 1-2 times weekly. Consent on file and patient instructions given verbally and provided in written form for her PCP. Patient waited 30 minutes prior to leaving. No adverse reaction noted.    Cathi Roan 08/12/2019, 9:43 AM

## 2019-08-19 DIAGNOSIS — Z516 Encounter for desensitization to allergens: Secondary | ICD-10-CM | POA: Diagnosis not present

## 2019-08-25 DIAGNOSIS — J301 Allergic rhinitis due to pollen: Secondary | ICD-10-CM | POA: Diagnosis not present

## 2019-08-25 DIAGNOSIS — J0101 Acute recurrent maxillary sinusitis: Secondary | ICD-10-CM | POA: Diagnosis not present

## 2019-08-26 DIAGNOSIS — Z516 Encounter for desensitization to allergens: Secondary | ICD-10-CM | POA: Diagnosis not present

## 2019-09-02 DIAGNOSIS — Z516 Encounter for desensitization to allergens: Secondary | ICD-10-CM | POA: Diagnosis not present

## 2019-09-07 DIAGNOSIS — J301 Allergic rhinitis due to pollen: Secondary | ICD-10-CM | POA: Diagnosis not present

## 2019-09-07 DIAGNOSIS — J0101 Acute recurrent maxillary sinusitis: Secondary | ICD-10-CM | POA: Diagnosis not present

## 2019-09-09 DIAGNOSIS — Z516 Encounter for desensitization to allergens: Secondary | ICD-10-CM | POA: Diagnosis not present

## 2019-09-16 DIAGNOSIS — Z516 Encounter for desensitization to allergens: Secondary | ICD-10-CM | POA: Diagnosis not present

## 2019-09-23 ENCOUNTER — Ambulatory Visit: Payer: Self-pay

## 2019-09-23 DIAGNOSIS — Z516 Encounter for desensitization to allergens: Secondary | ICD-10-CM | POA: Diagnosis not present

## 2019-09-28 DIAGNOSIS — I10 Essential (primary) hypertension: Secondary | ICD-10-CM | POA: Diagnosis not present

## 2019-09-28 DIAGNOSIS — G43909 Migraine, unspecified, not intractable, without status migrainosus: Secondary | ICD-10-CM | POA: Diagnosis not present

## 2019-09-28 DIAGNOSIS — E876 Hypokalemia: Secondary | ICD-10-CM | POA: Diagnosis not present

## 2019-09-28 DIAGNOSIS — R4582 Worries: Secondary | ICD-10-CM | POA: Diagnosis not present

## 2019-09-28 DIAGNOSIS — K219 Gastro-esophageal reflux disease without esophagitis: Secondary | ICD-10-CM | POA: Diagnosis not present

## 2019-09-28 DIAGNOSIS — E782 Mixed hyperlipidemia: Secondary | ICD-10-CM | POA: Diagnosis not present

## 2019-09-30 ENCOUNTER — Telehealth: Payer: Self-pay

## 2019-09-30 DIAGNOSIS — Z516 Encounter for desensitization to allergens: Secondary | ICD-10-CM | POA: Diagnosis not present

## 2019-09-30 NOTE — Telephone Encounter (Signed)
Patient called stating she receives allergy injections at Dr Olena Heckle office. She has been having some reactions with her injections. Dr Quillian Quince wanted her to call and see what she should do for her next injection that is scheduled today.  Please Advise.

## 2019-09-30 NOTE — Telephone Encounter (Signed)
I spoke to Dr. Quillian Quince and we agreed to back the patient down to 0.05 on the green (1:1000) vail and change to schedule A. I then called the patient to let her know of the changes. All questions answered. She has an EpiPen that is within date. She will call with any further questions.

## 2019-10-07 DIAGNOSIS — Z516 Encounter for desensitization to allergens: Secondary | ICD-10-CM | POA: Diagnosis not present

## 2019-10-14 DIAGNOSIS — Z516 Encounter for desensitization to allergens: Secondary | ICD-10-CM | POA: Diagnosis not present

## 2019-10-15 DIAGNOSIS — J301 Allergic rhinitis due to pollen: Secondary | ICD-10-CM | POA: Diagnosis not present

## 2019-10-15 DIAGNOSIS — M791 Myalgia, unspecified site: Secondary | ICD-10-CM | POA: Diagnosis not present

## 2019-10-15 DIAGNOSIS — M7062 Trochanteric bursitis, left hip: Secondary | ICD-10-CM | POA: Diagnosis not present

## 2019-10-15 DIAGNOSIS — M549 Dorsalgia, unspecified: Secondary | ICD-10-CM | POA: Diagnosis not present

## 2019-10-15 DIAGNOSIS — M797 Fibromyalgia: Secondary | ICD-10-CM | POA: Diagnosis not present

## 2019-10-21 DIAGNOSIS — J301 Allergic rhinitis due to pollen: Secondary | ICD-10-CM | POA: Diagnosis not present

## 2019-10-21 DIAGNOSIS — M7062 Trochanteric bursitis, left hip: Secondary | ICD-10-CM | POA: Diagnosis not present

## 2019-10-21 DIAGNOSIS — M545 Low back pain: Secondary | ICD-10-CM | POA: Diagnosis not present

## 2019-10-21 DIAGNOSIS — M797 Fibromyalgia: Secondary | ICD-10-CM | POA: Diagnosis not present

## 2019-10-21 DIAGNOSIS — M791 Myalgia, unspecified site: Secondary | ICD-10-CM | POA: Diagnosis not present

## 2019-10-28 DIAGNOSIS — Z516 Encounter for desensitization to allergens: Secondary | ICD-10-CM | POA: Diagnosis not present

## 2019-10-29 ENCOUNTER — Telehealth: Payer: Self-pay

## 2019-10-29 ENCOUNTER — Encounter: Payer: Self-pay | Admitting: Specialist

## 2019-10-29 ENCOUNTER — Other Ambulatory Visit: Payer: Self-pay

## 2019-10-29 ENCOUNTER — Ambulatory Visit: Payer: Medicare Other | Admitting: Specialist

## 2019-10-29 VITALS — BP 130/85 | HR 84 | Ht 67.0 in | Wt 171.2 lb

## 2019-10-29 DIAGNOSIS — Z4889 Encounter for other specified surgical aftercare: Secondary | ICD-10-CM | POA: Diagnosis not present

## 2019-10-29 DIAGNOSIS — M48062 Spinal stenosis, lumbar region with neurogenic claudication: Secondary | ICD-10-CM

## 2019-10-29 DIAGNOSIS — M5135 Other intervertebral disc degeneration, thoracolumbar region: Secondary | ICD-10-CM | POA: Diagnosis not present

## 2019-10-29 DIAGNOSIS — Z981 Arthrodesis status: Secondary | ICD-10-CM | POA: Diagnosis not present

## 2019-10-29 NOTE — Telephone Encounter (Signed)
Spoke with patient to review her medications and drug allergies before she is scheduled for a myelogram.  She stated an understanding she will be here two hours, will need a driver and will need to be on strict bedrest (explained) for 24 hours after the procedure.  She has no medications to hold for this procedure.

## 2019-10-29 NOTE — Patient Instructions (Signed)
Avoid bending, stooping and avoid lifting weights greater than 10 lbs. Avoid prolong standing and walking. Avoid frequent bending and stooping  No lifting greater than 10 lbs. May use ice or moist heat for pain. Weight loss is of benefit. Handicap license is approved.  

## 2019-10-29 NOTE — Progress Notes (Signed)
Office Visit Note   Patient: [name]           Date of Birth: 07/20/1955           MRN: [id number] Visit Date: 10/29/2019              Requested by: Caryl Bis, MD [place],  [place] [id number] PCP: Caryl Bis, MD   Assessment & Plan: Visit Diagnoses:  1. DDD (degenerative disc disease), thoracolumbar   2. Encounter for other specified surgical aftercare   3. Spinal stenosis of lumbar region with neurogenic claudication   4. Arthrodesis status     Plan: Avoid bending, stooping and avoid lifting weights greater than 10 lbs. Avoid prolong standing and walking. Avoid frequent bending and stooping  No lifting greater than 10 lbs. May use ice or moist heat for pain. Weight loss is of benefit. Handicap license is approved.  Follow-Up Instructions: No follow-ups on file.   Orders:  Orders Placed This Encounter  Procedures  . DG Myelogram Thoracic  . DG Myelogram Lumbar  . CT LUMBAR SPINE W CONTRAST  . CT THORACIC SPINE W CONTRAST   No orders of the defined types were placed in this encounter.     Procedures: No procedures performed   Clinical Data: No additional findings.   Subjective: Chief Complaint  Patient presents with  . Lower Back - Pain, Fracture    64 year old female with history of spondylolisthesis and spinal stenosis post lumbar fusion L1 to S1. She has an indwelling SCS by Dr. Ernestina Patches and Dr. Maryjean Ka. She has had better relief intially with the trial then the implant was place in 2018 and she has had the implant for about 3 years. The SCS has been adjusted 3-4 times and it is not as effective as previously. This last 3 months she has had increased back  pain bilateral in the lower back and mid lumbar bilaterally. The pain is increased with anything she does. This AM getting up it was difficult to Stand up straight. She has difficulty reaching her shoes and socks, hips are stiff. No bowel or bladder incontinence, there is a  pressure sensation when she needs to void. Last seen about 2016. She takes celebrex and tylenol for discomfort. It's like taking water, arthritis strength tylenol. She is able to walk, looses her balance sometimes some days are worse than others. She leans on the buggy when grocery shopping. She and her husband walks around their home. There is numbness right anterior thigh with standing and with sitting. Squatting with difficulty returning to standing.    Review of Systems  Constitutional: Positive for activity change. Negative for appetite change, chills, diaphoresis, fatigue, fever and unexpected weight change.  HENT: Positive for rhinorrhea, sinus pressure and sinus pain. Negative for congestion, dental problem, drooling, ear discharge, ear pain, facial swelling, hearing loss, mouth sores, nosebleeds, postnasal drip, sneezing, sore throat, tinnitus, trouble swallowing and voice change.   Eyes: Positive for pain, redness and itching. Negative for photophobia, discharge and visual disturbance.  Respiratory: Positive for wheezing and stridor. Negative for apnea, cough, choking and chest tightness.      Objective: Vital Signs: BP 130/85 (BP Location: Left Arm, Patient Position: Sitting)   Pulse 84   Ht 5\' 7"  (1.702 m)   Wt 171 lb 3.2 oz (77.7 kg)   BMI 26.81 kg/m   Physical Exam Constitutional:      Appearance: She is well-developed.  HENT:  Head: Normocephalic and atraumatic.  Eyes:     Pupils: Pupils are equal, round, and reactive to light.  Pulmonary:     Effort: Pulmonary effort is normal.     Breath sounds: Normal breath sounds.  Abdominal:     General: Bowel sounds are normal.     Palpations: Abdomen is soft.  Musculoskeletal:     Cervical back: Normal range of motion and neck supple.     Lumbar back: Negative right straight leg raise test and negative left straight leg raise test.  Skin:    General: Skin is warm and dry.  Neurological:     Mental Status: She is alert  and oriented to person, place, and time.  Psychiatric:        Behavior: Behavior normal.        Thought Content: Thought content normal.        Judgment: Judgment normal.     Back Exam   Tenderness  The patient is experiencing tenderness in the lumbar.  Range of Motion  Extension: abnormal  Flexion: abnormal  Lateral bend right: normal  Lateral bend left: normal  Rotation right: normal  Rotation left: normal   Muscle Strength  Right Quadriceps:  5/5  Left Quadriceps:  5/5  Right Hamstrings:  5/5  Left Hamstrings:  5/5   Tests  Straight leg raise right: negative Straight leg raise left: negative  Reflexes  Patellar: 0/4 Achilles: 0/4 Babinski's sign: normal   Other  Toe walk: abnormal Heel walk: abnormal Sensation: normal Gait: normal  Erythema: no back redness Scars: absent      Specialty Comments:  No specialty comments available.  Imaging: No results found.   PMFS History: Patient Active Problem List   Diagnosis Date Noted  . Pseudarthrosis after fusion or arthrodesis 08/09/2014    Priority: High    Class: Chronic  . Painful orthopaedic hardware ([place]) 08/09/2014    Priority: High    Class: Chronic  . Spinal stenosis, lumbar region, with neurogenic claudication 04/07/2013    Priority: High    Class: Chronic  . Degenerative spondylolisthesis 04/07/2013    Priority: High    Class: Chronic  . Ileus, postoperative ([place]) 08/13/2014    Priority: Medium    Class: Acute  . Seasonal and perennial allergic rhinitis 02/18/2019  . Vulvar cysts 04/26/2014  . Spinal stenosis of lumbar region with neurogenic claudication 04/07/2013   Past Medical History:  Diagnosis Date  . Arthritis   . Asthma   . Cancer (HCC)    cervical , skin  . Cold    recent  rx  . Fibromyalgia   . GERD (gastroesophageal reflux disease)   . H/O hiatal hernia   . Hypertension   . PONV (postoperative nausea and vomiting)   . Shortness of breath   . Sleep apnea    '  mild " does not use cpap    Family History  Problem Relation Age of Onset  . Allergic rhinitis Neg Hx   . Angioedema Neg Hx   . Asthma Neg Hx   . Atopy Neg Hx   . Eczema Neg Hx   . Immunodeficiency Neg Hx   . Urticaria Neg Hx     Past Surgical History:  Procedure Laterality Date  . ABDOMINAL HYSTERECTOMY  04  . APPENDECTOMY    . BACK SURGERY     x3  . CERVICAL CONIZATION W/BX    . CERVICAL DISC SURGERY  06  . CHOLECYSTECTOMY  03  .  LUMBAR FUSION  04/07/2013   L 2 L3 L4    with rod     DR Jasier Calabretta  . TONSILLECTOMY     Social History   Occupational History  . Not on file  Tobacco Use  . Smoking status: Never Smoker  . Smokeless tobacco: Never Used  Substance and Sexual Activity  . Alcohol use: No  . Drug use: No  . Sexual activity: Yes    Birth control/protection: Surgical

## 2019-11-03 ENCOUNTER — Ambulatory Visit
Admission: RE | Admit: 2019-11-03 | Discharge: 2019-11-03 | Disposition: A | Discharge status: Home / Self Care | Payer: Medicare Other | Source: Ambulatory Visit | Attending: Specialist | Admitting: Specialist

## 2019-11-03 DIAGNOSIS — Z981 Arthrodesis status: Secondary | ICD-10-CM

## 2019-11-03 DIAGNOSIS — M48061 Spinal stenosis, lumbar region without neurogenic claudication: Secondary | ICD-10-CM | POA: Diagnosis not present

## 2019-11-03 DIAGNOSIS — M5135 Other intervertebral disc degeneration, thoracolumbar region: Secondary | ICD-10-CM

## 2019-11-03 DIAGNOSIS — Z4889 Encounter for other specified surgical aftercare: Secondary | ICD-10-CM

## 2019-11-03 DIAGNOSIS — M48062 Spinal stenosis, lumbar region with neurogenic claudication: Secondary | ICD-10-CM

## 2019-11-03 DIAGNOSIS — M5124 Other intervertebral disc displacement, thoracic region: Secondary | ICD-10-CM | POA: Diagnosis not present

## 2019-11-03 MED ORDER — IOPAMIDOL (ISOVUE-M 300) INJECTION 61%
10.0000 mL | Freq: Once | INTRAMUSCULAR | Status: AC
  Administered 2019-11-03: 10 mL via INTRATHECAL

## 2019-11-03 MED ORDER — ONDANSETRON HCL 4 MG/2ML IJ SOLN: 4.0000 mg | Freq: Four times a day (QID) | INTRAMUSCULAR | Status: DC | PRN

## 2019-11-03 MED ORDER — DIAZEPAM 5 MG PO TABS
10.0000 mg | ORAL_TABLET | Freq: Once | ORAL | Status: AC
  Administered 2019-11-03: 10 mg via ORAL

## 2019-11-03 NOTE — Discharge Instructions (Signed)

## 2019-11-11 DIAGNOSIS — M545 Low back pain: Secondary | ICD-10-CM | POA: Diagnosis not present

## 2019-11-11 DIAGNOSIS — Z516 Encounter for desensitization to allergens: Secondary | ICD-10-CM | POA: Diagnosis not present

## 2019-11-18 DIAGNOSIS — Z516 Encounter for desensitization to allergens: Secondary | ICD-10-CM | POA: Diagnosis not present

## 2019-11-25 DIAGNOSIS — Z516 Encounter for desensitization to allergens: Secondary | ICD-10-CM | POA: Diagnosis not present

## 2019-11-27 DIAGNOSIS — M7062 Trochanteric bursitis, left hip: Secondary | ICD-10-CM | POA: Diagnosis not present

## 2019-11-27 DIAGNOSIS — Z23 Encounter for immunization: Secondary | ICD-10-CM | POA: Diagnosis not present

## 2019-11-27 DIAGNOSIS — M545 Low back pain: Secondary | ICD-10-CM | POA: Diagnosis not present

## 2019-12-01 ENCOUNTER — Other Ambulatory Visit: Payer: Self-pay

## 2019-12-01 ENCOUNTER — Ambulatory Visit (INDEPENDENT_AMBULATORY_CARE_PROVIDER_SITE_OTHER): Payer: Medicare Other | Admitting: Allergy & Immunology

## 2019-12-01 ENCOUNTER — Encounter: Payer: Self-pay | Admitting: Allergy & Immunology

## 2019-12-01 DIAGNOSIS — J4520 Mild intermittent asthma, uncomplicated: Secondary | ICD-10-CM

## 2019-12-01 DIAGNOSIS — J302 Other seasonal allergic rhinitis: Secondary | ICD-10-CM | POA: Diagnosis not present

## 2019-12-01 DIAGNOSIS — J3089 Other allergic rhinitis: Secondary | ICD-10-CM | POA: Diagnosis not present

## 2019-12-01 MED ORDER — PREDNISONE 10 MG PO TABS: ORAL_TABLET | ORAL | 1 refills | Status: DC

## 2019-12-01 NOTE — Progress Notes (Signed)
RE: [name] MRN: [id number] DOB: June 14, 1955 Date of Telemedicine Visit: 12/01/2019  Referring provider: Caryl Bis, MD Primary care provider: Caryl Bis, MD  Chief Complaint: Follow-up   Telemedicine Follow Up Visit via Telephone: I connected with [name] for a follow up on 12/01/19 by telephone and verified that I am speaking with the correct Mendoza using two identifiers.   I discussed the limitations, risks, security and privacy concerns of performing an evaluation and management service by telephone and the availability of in Mendoza appointments. I also discussed with the patient that there may be a patient responsible charge related to this service. The patient expressed understanding and agreed to proceed.  Patient is at home.  Provider is at the office.  Visit start time: 4:42 PM Visit end time: 5:01 PM Insurance consent/check in by: [facility] Medical consent and medical assistant/nurse: Ashleigh  History of Present Illness:  She is a 64 y.o. female, who is being followed for intermittent asthma as well as perennial and seasonal allergic rhinitis. Her previous allergy office visit was in November 2020 with myself.  She was last seen in November 2020 as a new patient.  At that time, skin testing was positive to ragweed, indoor and outdoor molds, dust mites, cat, and dog.  We stopped all of her allergy medications as resolved and started Xyzal as well as Dymista 1-2 times daily as needed.  She has made the decision to start allergen immunotherapy.  Her asthma is controlled with albuterol as needed.  She was getting her allergen immunotherapy from her PCPs office, Dr. Quillian Quince, in [place].  The last note we have about her allergy shots is July 2021 when our nurse practitioner backed her down to 0.05 of the [employer].  Asthma/Respiratory Symptom History: Her asthma is well controlled at this time.  [name]'s asthma has been well controlled. She has not required  rescue medication, experienced nocturnal awakenings due to lower respiratory symptoms, nor have activities of daily living been limited. She has required no Emergency Department or Urgent Care visits for her asthma. She has required zero courses of systemic steroids for asthma exacerbations since the last visit. ACT score today is 25, indicating excellent asthma symptom control.   Allergic Rhinitis Symptom History: She remains on her medications and is actually using her antihistamine twice daily. She does have a nose spray which she is using daily. She tells me that her ragweed is "out of control" depiste the use of allergy shots. She has been having a lot of large local reactions. Ever since she has been on the [employer]. She reports that she is developing headache and nausea for 3-4 days. Every time she has one that becomes large and red, The mold containing ones became particularly terrible and very reactive. She is not sure whether they are working, but it should be noted that she has not reached her maintenance vial as of yet.   She is going to be having back surgery.  This is going to be her fifth back surgery.  She is rather nervous about it.  She is unsure of the recovery time, but she is not going to be able to get her allergy shots during this recovery time.  She is wondering what she needs to do during this time.  Otherwise, there have been no changes to her past medical history, surgical history, family history, or social history.  Assessment and Plan:  [name] is a 64 y.o. female with:  Seasonal and perennial allergic rhinitis (ragweed, indoor molds, outdoor molds, dust mites, cat, dog and cockroach) - with large local reactions  Intermittent asthma, uncomplicated - controlled with albuterol as needed  Complicated past medical history, including fibromyalgia with multiple flares  Disabled status - due to multiple back surgeries (5th back surgery scheduled for fall 2021)   We  are to stop her allergy shots for now since she will not be able to get them for a few months after her back surgery.  When she restarts, we are going to remix her mold containing vial to decrease the number of molds and it and get rid of the cockroach.  I conjecture that these of the 2 allergens that are causing her large local reactions, so hopefully increase the amount of diluent in the allergen vial will help her to be able to tolerate them better.  We are also increasing the ragweed and her pollen containing vial to provide more protection during the ragweed heavy time of the year.  She is going to restart them in January 2022, so will remix the vials closer to that date. We could do epi rinses if the large local reactions continue with the new vials.    Diagnostics: None.  Medication List:  Current Outpatient Medications  Medication Sig Dispense Refill  . baclofen (LIORESAL) 10 MG tablet Take 10 mg by mouth 3 (three) times daily.    . celecoxib (CELEBREX) 200 MG capsule Take 200 mg by mouth daily.    . clonazePAM (KLONOPIN) 0.5 MG tablet Take 0.5 mg by mouth at bedtime.    . cyclobenzaprine (FLEXERIL) 10 MG tablet Take 10 mg by mouth 3 (three) times daily as needed for muscle spasms.    . diphenhydrAMINE (BENADRYL) 25 mg capsule Take 1 capsule (25 mg total) by mouth every 8 (eight) hours as needed for itching. 40 capsule 1  . EPINEPHrine 0.3 mg/0.3 mL IJ SOAJ injection SMARTSIG:0.3 Milliliter(s) IM Once PRN    . estradiol (ESTRACE) 0.5 MG tablet Take 0.5 mg by mouth daily.    . fluticasone (FLOVENT HFA) 110 MCG/ACT inhaler Inhale 1 puff into the lungs as needed.    . Metoprolol-Hydrochlorothiazide 50-12.5 MG TB24     . montelukast (SINGULAIR) 10 MG tablet     . pantoprazole (PROTONIX) 40 MG tablet Take 40 mg by mouth 2 (two) times daily.     No current facility-administered medications for this visit.   Allergies: Allergies  Allergen Reactions  . Cephalosporins Nausea And Vomiting  .  Codeine Hives and Palpitations    Hydrocodone, Ultram  . Doxycycline Nausea And Vomiting  . Novocain [Procaine] Swelling  . Other Nausea And Vomiting    Sensitive to pain medications  . Statins Other (See Comments)    myalgia  . Sulfa Antibiotics Hives and Nausea And Vomiting  . Tricyclic Antidepressants Nausea And Vomiting    Cymbalta and alike meds  . Ace Inhibitors Cough  . Asa [Aspirin] Nausea And Vomiting  . Morphine And Related     High blood pressure   . Ultram [Tramadol] Other (See Comments)    myalgia  . Zetia [Ezetimibe] Other (See Comments)    Muscle Aches   I reviewed her past medical history, social history, family history, and environmental history and no significant changes have been reported from previous visits.  Review of Systems  Constitutional: Negative for activity change, appetite change, chills, fatigue and fever.  HENT: Negative for congestion, postnasal drip, rhinorrhea, sinus pressure and  sore throat.   Eyes: Negative for pain, discharge, redness and itching.  Respiratory: Negative for shortness of breath, wheezing and stridor.   Gastrointestinal: Negative for diarrhea, nausea and vomiting.  Musculoskeletal: Negative for arthralgias, joint swelling and myalgias.  Skin: Negative for rash.  Allergic/Immunologic: Positive for food allergies. Negative for environmental allergies.    Objective:  Physical exam not obtained as encounter was done via telephone.   Previous notes and tests were reviewed.  I discussed the assessment and treatment plan with the patient. The patient was provided an opportunity to ask questions and all were answered. The patient agreed with the plan and demonstrated an understanding of the instructions.   The patient was advised to call back or seek an in-Mendoza evaluation if the symptoms worsen or if the condition fails to improve as anticipated.  I provided 19 minutes of non-face-to-face time during this encounter.  It was  my pleasure to participate in [name]'s care today. Please feel free to contact me with any questions or concerns.   Sincerely,  Valentina Shaggy, MD

## 2019-12-04 ENCOUNTER — Ambulatory Visit: Payer: Medicare Other | Admitting: Specialist

## 2019-12-04 ENCOUNTER — Other Ambulatory Visit: Payer: Self-pay

## 2019-12-04 ENCOUNTER — Encounter: Payer: Self-pay | Admitting: Specialist

## 2019-12-04 ENCOUNTER — Ambulatory Visit: Payer: Self-pay

## 2019-12-04 VITALS — BP 120/79 | HR 64 | Ht 67.0 in | Wt 171.0 lb

## 2019-12-04 DIAGNOSIS — R29898 Other symptoms and signs involving the musculoskeletal system: Secondary | ICD-10-CM

## 2019-12-04 DIAGNOSIS — M5135 Other intervertebral disc degeneration, thoracolumbar region: Secondary | ICD-10-CM

## 2019-12-04 DIAGNOSIS — Z981 Arthrodesis status: Secondary | ICD-10-CM

## 2019-12-04 DIAGNOSIS — M48062 Spinal stenosis, lumbar region with neurogenic claudication: Secondary | ICD-10-CM | POA: Diagnosis not present

## 2019-12-04 DIAGNOSIS — S32010S Wedge compression fracture of first lumbar vertebra, sequela: Secondary | ICD-10-CM

## 2019-12-04 DIAGNOSIS — M4015 Other secondary kyphosis, thoracolumbar region: Secondary | ICD-10-CM

## 2019-12-04 NOTE — Progress Notes (Signed)
Office Visit Note   Patient: [name]           Date of Birth: September 18, 1955           MRN: [id number] Visit Date: 12/04/2019              Requested by: Caryl Bis, MD [place],  [place] [id number] PCP: Caryl Bis, MD   Assessment & Plan: Visit Diagnoses:  1. Closed compression fracture of L1 vertebra, sequela   2. Other secondary kyphosis, thoracolumbar region   3. Arthrodesis status   4. DDD (degenerative disc disease), thoracolumbar   5. Spinal stenosis of lumbar region with neurogenic claudication     Plan: Avoid bending, stooping and avoid lifting weights greater than 10 lbs. Avoid prolong standing and walking. Avoid frequent bending and stooping  No lifting greater than 10 lbs. May use ice or moist heat for pain. Weight loss is of benefit. Handicap license is approved. Dr. Romona Curls secretary/Assistant will call to arrange for EMG/NCV of the arm to assess for cervical nerve irritation. Bone Density testing due to insufficiency fracture at the top of the long lumbar fusion and to determine if treatment of  Bone weakness is necessary before considering extension of the fusion. Long scoliosis type xrays that show the whole spine in standing to allow an evaluation of the entire spine alignment to  Allow for presurgical evaluation and planning to restore the normal aligment to neutral and prevent more surgeries.   Follow-Up Instructions: No follow-ups on file.   Orders:  No orders of the defined types were placed in this encounter.  No orders of the defined types were placed in this encounter.     Procedures: No procedures performed   Clinical Data: No additional findings.   Subjective: Chief Complaint  Patient presents with  . Lower Back - Follow-up    64 year old female with history of lumbar degenerative disc disease with lumbar fusion L1-S1. She has a spinal cord stimulator in place but is experiencing quite a bit of pain even with it  in place. The stimulator never worked as well as the temporary stimulator and the setting has been changed multiple times and it doesn't help. She is not able to tolerate most pain relievers and takes only tylenol for pain. Even tricyclic Medications are not tolerated. No Bowel or bladder difficulty, she does have difficulty fully emptying her bladder. She take intermittant muscle relaxers baclofen, cyclobenziprine and klonopin. Takes celebrex 200 mg po one time per day.    Review of Systems  Constitutional: Positive for activity change. Negative for appetite change, chills, diaphoresis, fatigue, fever and unexpected weight change.  HENT: Positive for postnasal drip, rhinorrhea, sinus pressure, sinus pain and sneezing. Negative for congestion, dental problem, drooling, ear discharge, ear pain, facial swelling, hearing loss, mouth sores, nosebleeds, sore throat, tinnitus, trouble swallowing and voice change.   Eyes: Positive for redness and itching. Negative for photophobia, pain, discharge and visual disturbance.  Respiratory: Positive for wheezing. Negative for apnea, cough, choking, chest tightness, shortness of breath and stridor.   Cardiovascular: Positive for leg swelling (Ankles are swelling). Negative for chest pain and palpitations.  Gastrointestinal: Negative.  Negative for abdominal distention, abdominal pain, anal bleeding, blood in stool, constipation, diarrhea, nausea, rectal pain and vomiting.  Endocrine: Positive for cold intolerance. Negative for heat intolerance, polydipsia, polyphagia and polyuria.  Genitourinary: Negative for decreased urine volume, difficulty urinating, dyspareunia, dysuria, enuresis, flank pain, frequency, hematuria  and urgency.  Musculoskeletal: Positive for back pain, gait problem and neck stiffness. Negative for arthralgias, joint swelling, myalgias and neck pain.  Skin: Negative.  Negative for color change, pallor, rash and wound.  Allergic/Immunologic:  Positive for environmental allergies. Negative for food allergies and immunocompromised state.  Neurological: Positive for weakness and numbness. Negative for dizziness, tremors, seizures, syncope, facial asymmetry, speech difficulty, light-headedness and headaches.  Hematological: Negative.  Negative for adenopathy.  Psychiatric/Behavioral: Positive for sleep disturbance. Negative for agitation, behavioral problems, confusion, decreased concentration, dysphoric mood, hallucinations, self-injury and suicidal ideas. The patient is not nervous/anxious and is not hyperactive.      Objective: Vital Signs: BP 120/79 (BP Location: Left Arm, Patient Position: Sitting)   Pulse 64   Ht 5\' 7"  (1.702 m)   Wt 171 lb (77.6 kg)   BMI 26.78 kg/m   Physical Exam Constitutional:      Appearance: She is well-developed.  HENT:     Head: Normocephalic and atraumatic.  Eyes:     Pupils: Pupils are equal, round, and reactive to light.  Pulmonary:     Effort: Pulmonary effort is normal.     Breath sounds: Normal breath sounds.  Abdominal:     General: Bowel sounds are normal.     Palpations: Abdomen is soft.  Musculoskeletal:     Cervical back: Normal range of motion and neck supple.     Lumbar back: Negative right straight leg raise test and negative left straight leg raise test.  Skin:    General: Skin is warm and dry.  Neurological:     Mental Status: She is alert and oriented to person, place, and time.  Psychiatric:        Behavior: Behavior normal.        Thought Content: Thought content normal.        Judgment: Judgment normal.     Back Exam   Tenderness  The patient is experiencing tenderness in the lumbar and thoracic.  Range of Motion  Extension: 70  Flexion: 70  Lateral bend right: abnormal  Lateral bend left: abnormal  Rotation right: abnormal  Rotation left: abnormal   Muscle Strength  Right Quadriceps:  5/5  Left Quadriceps:  5/5  Right Hamstrings:  5/5  Left  Hamstrings:  5/5   Tests  Straight leg raise right: negative Straight leg raise left: negative  Reflexes  Patellar: 2/4 Achilles: 2/4  Other  Toe walk: normal Heel walk: normal Sensation: decreased Gait: normal  Erythema: no back redness Scars: present  Comments:  Right leg with upper thigh and right great toe numbness.      Specialty Comments:  No specialty comments available.  Imaging: No results found.   PMFS History: Patient Active Problem List   Diagnosis Date Noted  . Pseudarthrosis after fusion or arthrodesis 08/09/2014    Priority: High    Class: Chronic  . Painful orthopaedic hardware ([place]) 08/09/2014    Priority: High    Class: Chronic  . Spinal stenosis, lumbar region, with neurogenic claudication 04/07/2013    Priority: High    Class: Chronic  . Degenerative spondylolisthesis 04/07/2013    Priority: High    Class: Chronic  . Ileus, postoperative ([place]) 08/13/2014    Priority: Medium    Class: Acute  . Seasonal and perennial allergic rhinitis 02/18/2019  . Vulvar cysts 04/26/2014  . Spinal stenosis of lumbar region with neurogenic claudication 04/07/2013   Past Medical History:  Diagnosis Date  . Arthritis   .  Asthma   . Cancer (HCC)    cervical , skin  . Cold    recent  rx  . Fibromyalgia   . GERD (gastroesophageal reflux disease)   . H/O hiatal hernia   . Hypertension   . PONV (postoperative nausea and vomiting)   . Shortness of breath   . Sleep apnea    ' mild " does not use cpap    Family History  Problem Relation Age of Onset  . Allergic rhinitis Neg Hx   . Angioedema Neg Hx   . Asthma Neg Hx   . Atopy Neg Hx   . Eczema Neg Hx   . Immunodeficiency Neg Hx   . Urticaria Neg Hx     Past Surgical History:  Procedure Laterality Date  . ABDOMINAL HYSTERECTOMY  04  . APPENDECTOMY    . BACK SURGERY     x3  . CERVICAL CONIZATION W/BX    . CERVICAL DISC SURGERY  06  . CHOLECYSTECTOMY  03  . LUMBAR FUSION  04/07/2013   L 2  L3 L4    with rod     DR Mulki Roesler  . TONSILLECTOMY     Social History   Occupational History  . Not on file  Tobacco Use  . Smoking status: Never Smoker  . Smokeless tobacco: Never Used  Vaping Use  . Vaping Use: Never used  Substance and Sexual Activity  . Alcohol use: No  . Drug use: No  . Sexual activity: Yes    Birth control/protection: Surgical

## 2019-12-04 NOTE — Patient Instructions (Signed)
Plan: Avoid bending, stooping and avoid lifting weights greater than 10 lbs. Avoid prolong standing and walking. Avoid frequent bending and stooping  No lifting greater than 10 lbs. May use ice or moist heat for pain. Weight loss is of benefit. Handicap license is approved. Dr. Romona Curls secretary/Assistant will call to arrange for EMG/NCV of the arm to assess for cervical nerve irritation. Bone Density testing due to insufficiency fracture at the top of the long lumbar fusion and to determine if treatment of  Bone weakness is necessary before considering extension of the fusion. Long scoliosis type xrays that show the whole spine in standing to allow an evaluation of the entire spine alignment to  Allow for presurgical evaluation and planning to restore the normal aligment to neutral and prevent more surgeries.

## 2019-12-08 ENCOUNTER — Telehealth: Payer: Self-pay | Admitting: Physical Medicine and Rehabilitation

## 2019-12-08 NOTE — Telephone Encounter (Signed)
Patient called asked if she can be added to the cancellation list for an earlier appointment before she sees Dr Louanne Skye 01/14/2020. The number to contact patient is [phone]

## 2019-12-09 NOTE — Telephone Encounter (Signed)
Called pt to inform her she on the waiting list.

## 2019-12-10 ENCOUNTER — Telehealth: Payer: Self-pay | Admitting: Specialist

## 2019-12-10 ENCOUNTER — Other Ambulatory Visit: Payer: Self-pay | Admitting: Radiology

## 2019-12-10 DIAGNOSIS — M5135 Other intervertebral disc degeneration, thoracolumbar region: Secondary | ICD-10-CM

## 2019-12-10 DIAGNOSIS — M48062 Spinal stenosis, lumbar region with neurogenic claudication: Secondary | ICD-10-CM

## 2019-12-10 DIAGNOSIS — M4015 Other secondary kyphosis, thoracolumbar region: Secondary | ICD-10-CM

## 2019-12-10 DIAGNOSIS — S32010S Wedge compression fracture of first lumbar vertebra, sequela: Secondary | ICD-10-CM

## 2019-12-10 DIAGNOSIS — Z780 Asymptomatic menopausal state: Secondary | ICD-10-CM

## 2019-12-10 NOTE — Telephone Encounter (Signed)
I called.Marland Kitchen Spoke with patient at length. Assured her this would be addressed.  I was able to schedule her for study in cancellation spot for tomorrow morning.

## 2019-12-10 NOTE — Telephone Encounter (Signed)
Patient called requesting a call back from [place]. Patient states that Forestine Na needs an order for xrays of patient spine. Please call patient about this matter at [phone].

## 2019-12-10 NOTE — Telephone Encounter (Signed)
I called patient, she states that per Dr. Louanne Skye he wanted her to have a bone density scan done and an xray of her entire spine (scoliosis series), I did see this noted in the chart but the orders were not placed for them, I did place them today, she states that she did call back and ask for someone from Dr. Kennon Portela (she is supposed to be getting an EMG/NCS) staff to call back and asked to be put on the cancellation list so she can get in sooner and also was asking if she could help her with the other 2 appointments that she was to have also and was meet by an unpleasant person advising that "you should call and bother them like you called and bothered me" and just had an attitude with her. Patient states that this completely upset her to be treated this way. I did apologized for it and advised that it was unacceptable for this behavior and I advised that I would let the supervisor know if this and advised that she may call her. Thank you for your help with this.

## 2019-12-11 ENCOUNTER — Ambulatory Visit: Payer: Medicare Other | Admitting: Physical Medicine and Rehabilitation

## 2019-12-11 ENCOUNTER — Encounter: Payer: Self-pay | Admitting: Physical Medicine and Rehabilitation

## 2019-12-11 ENCOUNTER — Other Ambulatory Visit: Payer: Self-pay

## 2019-12-11 DIAGNOSIS — R531 Weakness: Secondary | ICD-10-CM

## 2019-12-11 DIAGNOSIS — R202 Paresthesia of skin: Secondary | ICD-10-CM | POA: Diagnosis not present

## 2019-12-11 NOTE — Progress Notes (Signed)
Loss of strength in both hands. Sometimes has numbness in hands. Right had worse.  Right hand dominant. No lotion per patient. Numeric Pain Rating Scale and Functional Assessment Average Pain 5   In the last MONTH (on 0-10 scale) has pain interfered with the following?  1. General activity like being  able to carry out your everyday physical activities such as walking, climbing stairs, carrying groceries, or moving a chair?  Rating(4)

## 2019-12-15 NOTE — Progress Notes (Signed)
Kenzleigh Sedam Poteat - 64 y.o. female MRN [id number]  Date of birth: [birth year]  Office Visit Note: Visit Date: 12/11/2019 PCP: Caryl Bis, MD Referred by: Caryl Bis, MD  Subjective: Chief Complaint  Patient presents with  . Right Hand - Numbness, Weakness  . Left Hand - Numbness, Weakness   HPI:  SHEALYN SEAN is a 64 y.o. female who comes in today at the request of Dr. [name] for electrodiagnostic study of the Bilateral upper extremities.  Patient is Right hand dominant.  By way of review we have seen the patient in the past and she does have spinal cord stimulator trial for her lower back and leg pain which is not really being beneficial at this point.  She is also had prior lumbar surgery.  She comes in today with history of bilateral paresthesias and weakness in the hands.  She reports significant loss of strength and dropping objects.  The numbness is nondermatomal.  Pain levels 5 out of 10.  She has not had had prior electrodiagnostic study of the upper extremities.  Dr. Louanne Skye is concerned about a C7, C8 or T1 radiculopathy.  She has had prior electrodiagnostic studies of the lower limbs which were normal.  She does carry a diagnosis of fibromyalgia.   ROS Otherwise per HPI.  Assessment & Plan: Visit Diagnoses:  1. Paresthesia of skin   2. Weakness     Plan: Impression: Essentially NORMAL electrodiagnostic study of both upper limbs.  There is no significant electrodiagnostic evidence of nerve entrapment, brachial plexopathy, cervical radiculopathy or generalized peripheral neuropathy.    As you know, purely sensory or demyelinating radiculopathies and chemical radiculitis may not be detected with this particular electrodiagnostic study.** This electrodiagnostic study cannot rule out small fiber polyneuropathy and dysesthesias from central pain syndromes such as stroke or central pain sensitization syndromes such as fibromyalgia.  Myotomal referral pain from trigger  points is also not excluded.  Recommendations: 1.  Follow-up with referring physician. 2.  Continue current management of symptoms.  Meds & Orders: No orders of the defined types were placed in this encounter.   Orders Placed This Encounter  Procedures  . NCV with EMG (electromyography)    Follow-up: Return for [name], M.D. as scheduled.   Procedures: No procedures performed  EMG & NCV Findings: All nerve conduction studies (as indicated in the following tables) were within normal limits.  All left vs. right side differences were within normal limits.    All examined muscles (as indicated in the following table) showed no evidence of electrical instability.    Impression: Essentially NORMAL electrodiagnostic study of both upper limbs.  There is no significant electrodiagnostic evidence of nerve entrapment, brachial plexopathy, cervical radiculopathy or generalized peripheral neuropathy.    As you know, purely sensory or demyelinating radiculopathies and chemical radiculitis may not be detected with this particular electrodiagnostic study.** This electrodiagnostic study cannot rule out small fiber polyneuropathy and dysesthesias from central pain syndromes such as stroke or central pain sensitization syndromes such as fibromyalgia.  Myotomal referral pain from trigger points is also not excluded.  Recommendations: 1.  Follow-up with referring physician. 2.  Continue current management of symptoms.  ___________________________ Laurence Spates FAAPMR Board Certified, American Board of Physical Medicine and Rehabilitation    Nerve Conduction Studies Anti Sensory Summary Table   Stim Site NR Peak (ms) Norm Peak (ms) P-T Amp (V) Norm P-T Amp Site1 Site2 Delta-P (ms) Dist (cm) Vel (m/s) Norm Vel (m/s)  Left Median Acr Palm Anti Sensory (2nd Digit)  31.3C  Wrist    3.3 <3.6 33.4 >10 Wrist Palm 1.5 0.0    Palm    1.8 <2.0 22.4         Right Median Acr Palm Anti Sensory (2nd  Digit)  31.2C  Wrist    3.4 <3.6 30.9 >10 Wrist Palm 1.4 0.0    Palm    2.0 <2.0 25.8         Left Radial Anti Sensory (Base 1st Digit)  30.9C  Wrist    2.3 <3.1 40.6  Wrist Base 1st Digit 2.3 0.0    Right Radial Anti Sensory (Base 1st Digit)  30.7C  Wrist    2.7 <3.1 8.9  Wrist Base 1st Digit 2.7 0.0    Left Ulnar Anti Sensory (5th Digit)  31.4C  Wrist    3.1 <3.7 27.8 >15.0 Wrist 5th Digit 3.1 14.0 45 >38  Right Ulnar Anti Sensory (5th Digit)  31.6C  Wrist    3.2 <3.7 24.4 >15.0 Wrist 5th Digit 3.2 14.0 44 >38   Motor Summary Table   Stim Site NR Onset (ms) Norm Onset (ms) O-P Amp (mV) Norm O-P Amp Site1 Site2 Delta-0 (ms) Dist (cm) Vel (m/s) Norm Vel (m/s)  Left Median Motor (Abd Poll Brev)  31.1C  Wrist    3.4 <4.2 6.6 >5 Elbow Wrist 4.1 21.0 51 >50  Elbow    7.5  7.0         Right Median Motor (Abd Poll Brev)  30.9C  Wrist    3.7 <4.2 6.2 >5 Elbow Wrist 4.3 22.0 51 >50  Elbow    8.0  6.0         Left Ulnar Motor (Abd Dig Min)  31.4C  Wrist    3.4 <4.2 8.3 >3 B Elbow Wrist 3.5 19.5 56 >53  B Elbow    6.9  7.7  A Elbow B Elbow 1.5 10.0 67 >53  A Elbow    8.4  7.7         Right Ulnar Motor (Abd Dig Min)  31.1C  Wrist    3.4 <4.2 8.6 >3 B Elbow Wrist 3.6 19.5 54 >53  B Elbow    7.0  8.0  A Elbow B Elbow 1.2 10.0 83 >53  A Elbow    8.2  8.9          EMG   Side Muscle Nerve Root Ins Act Fibs Psw Amp Dur Poly Recrt Int Fraser Din Comment  Right Abd Poll Brev Median C8-T1 Nml Nml Nml Nml Nml 0 Nml Nml   Right 1stDorInt Ulnar C8-T1 Nml Nml Nml Nml Nml 0 Nml Nml   Right PronatorTeres Median C6-7 Nml Nml Nml Nml Nml 0 Nml Nml   Right Biceps Musculocut C5-6 Nml Nml Nml Nml Nml 0 Nml Nml   Right Deltoid Axillary C5-6 Nml Nml Nml Nml Nml 0 Nml Nml   Left Abd Poll Brev Median C8-T1 Nml Nml Nml Nml Nml 0 Nml Nml   Left 1stDorInt Ulnar C8-T1 Nml Nml Nml Nml Nml 0 Nml Nml   Left PronatorTeres Median C6-7 Nml Nml Nml Nml Nml 0 Nml Nml   Left Biceps Musculocut C5-6 Nml Nml Nml Nml Nml 0  Nml Nml   Left Deltoid Axillary C5-6 Nml Nml Nml Nml Nml 0 Nml Nml     Nerve Conduction Studies Anti Sensory Left/Right Comparison   Stim Site L Lat (ms) R Lat (ms) L-R  Lat (ms) L Amp (V) R Amp (V) L-R Amp (%) Site1 Site2 L Vel (m/s) R Vel (m/s) L-R Vel (m/s)  Median Acr Palm Anti Sensory (2nd Digit)  31.3C  Wrist 3.3 3.4 0.1 33.4 30.9 7.5 Wrist Palm     Palm 1.8 2.0 0.2 22.4 25.8 13.2       Radial Anti Sensory (Base 1st Digit)  30.9C  Wrist 2.3 2.7 0.4 40.6 8.9 78.1 Wrist Base 1st Digit     Ulnar Anti Sensory (5th Digit)  31.4C  Wrist 3.1 3.2 0.1 27.8 24.4 12.2 Wrist 5th Digit 45 44 1   Motor Left/Right Comparison   Stim Site L Lat (ms) R Lat (ms) L-R Lat (ms) L Amp (mV) R Amp (mV) L-R Amp (%) Site1 Site2 L Vel (m/s) R Vel (m/s) L-R Vel (m/s)  Median Motor (Abd Poll Brev)  31.1C  Wrist 3.4 3.7 0.3 6.6 6.2 6.1 Elbow Wrist 51 51 0  Elbow 7.5 8.0 0.5 7.0 6.0 14.3       Ulnar Motor (Abd Dig Min)  31.4C  Wrist 3.4 3.4 0.0 8.3 8.6 3.5 B Elbow Wrist 56 54 2  B Elbow 6.9 7.0 0.1 7.7 8.0 3.7 A Elbow B Elbow 67 83 16  A Elbow 8.4 8.2 0.2 7.7 8.9 13.5          Waveforms:                      Clinical History: Electrodiagnostic study bilateral lower limbs dated 05/19/2010  Impression: Normal electrodiagnostic study.  Prior 2009 electrodiagnostic study of both lower limbs was essentially normal.  This was conducted by Tora Kindred, DPT     Objective:  VS:  HT:    WT:   BMI:     BP:   HR: bpm  TEMP: ( )  RESP:  Physical Exam Musculoskeletal:        General: No swelling, tenderness or deformity.     Comments: Inspection reveals no atrophy of the bilateral APB or FDI or hand intrinsics. There is no swelling, color changes, allodynia or dystrophic changes. There is 5 out of 5 strength in the bilateral wrist extension, finger abduction and long finger flexion. There is intact sensation to light touch in all dermatomal and peripheral nerve distributions.  There is a  negative Hoffmann's test bilaterally.  Skin:    General: Skin is warm and dry.     Findings: No erythema or rash.  Neurological:     General: No focal deficit present.     Mental Status: She is alert and oriented to person, place, and time.     Motor: No weakness or abnormal muscle tone.     Coordination: Coordination normal.  Psychiatric:        Mood and Affect: Mood normal.        Behavior: Behavior normal.      Imaging: No results found.

## 2019-12-15 NOTE — Procedures (Signed)
EMG & NCV Findings: All nerve conduction studies (as indicated in the following tables) were within normal limits.  All left vs. right side differences were within normal limits.    All examined muscles (as indicated in the following table) showed no evidence of electrical instability.    Impression: Essentially NORMAL electrodiagnostic study of both upper limbs.  There is no significant electrodiagnostic evidence of nerve entrapment, brachial plexopathy, cervical radiculopathy or generalized peripheral neuropathy.    As you know, purely sensory or demyelinating radiculopathies and chemical radiculitis may not be detected with this particular electrodiagnostic study.** This electrodiagnostic study cannot rule out small fiber polyneuropathy and dysesthesias from central pain syndromes such as stroke or central pain sensitization syndromes such as fibromyalgia.  Myotomal referral pain from trigger points is also not excluded.  Recommendations: 1.  Follow-up with referring physician. 2.  Continue current management of symptoms.  ___________________________ Laurence Spates FAAPMR Board Certified, American Board of Physical Medicine and Rehabilitation    Nerve Conduction Studies Anti Sensory Summary Table   Stim Site NR Peak (ms) Norm Peak (ms) P-T Amp (V) Norm P-T Amp Site1 Site2 Delta-P (ms) Dist (cm) Vel (m/s) Norm Vel (m/s)  Left Median Acr Palm Anti Sensory (2nd Digit)  31.3C  Wrist    3.3 <3.6 33.4 >10 Wrist Palm 1.5 0.0    Palm    1.8 <2.0 22.4         Right Median Acr Palm Anti Sensory (2nd Digit)  31.2C  Wrist    3.4 <3.6 30.9 >10 Wrist Palm 1.4 0.0    Palm    2.0 <2.0 25.8         Left Radial Anti Sensory (Base 1st Digit)  30.9C  Wrist    2.3 <3.1 40.6  Wrist Base 1st Digit 2.3 0.0    Right Radial Anti Sensory (Base 1st Digit)  30.7C  Wrist    2.7 <3.1 8.9  Wrist Base 1st Digit 2.7 0.0    Left Ulnar Anti Sensory (5th Digit)  31.4C  Wrist    3.1 <3.7 27.8 >15.0 Wrist 5th  Digit 3.1 14.0 45 >38  Right Ulnar Anti Sensory (5th Digit)  31.6C  Wrist    3.2 <3.7 24.4 >15.0 Wrist 5th Digit 3.2 14.0 44 >38   Motor Summary Table   Stim Site NR Onset (ms) Norm Onset (ms) O-P Amp (mV) Norm O-P Amp Site1 Site2 Delta-0 (ms) Dist (cm) Vel (m/s) Norm Vel (m/s)  Left Median Motor (Abd Poll Brev)  31.1C  Wrist    3.4 <4.2 6.6 >5 Elbow Wrist 4.1 21.0 51 >50  Elbow    7.5  7.0         Right Median Motor (Abd Poll Brev)  30.9C  Wrist    3.7 <4.2 6.2 >5 Elbow Wrist 4.3 22.0 51 >50  Elbow    8.0  6.0         Left Ulnar Motor (Abd Dig Min)  31.4C  Wrist    3.4 <4.2 8.3 >3 B Elbow Wrist 3.5 19.5 56 >53  B Elbow    6.9  7.7  A Elbow B Elbow 1.5 10.0 67 >53  A Elbow    8.4  7.7         Right Ulnar Motor (Abd Dig Min)  31.1C  Wrist    3.4 <4.2 8.6 >3 B Elbow Wrist 3.6 19.5 54 >53  B Elbow    7.0  8.0  A Elbow B Elbow 1.2 10.0  83 >53  A Elbow    8.2  8.9          EMG   Side Muscle Nerve Root Ins Act Fibs Psw Amp Dur Poly Recrt Int Fraser Din Comment  Right Abd Poll Brev Median C8-T1 Nml Nml Nml Nml Nml 0 Nml Nml   Right 1stDorInt Ulnar C8-T1 Nml Nml Nml Nml Nml 0 Nml Nml   Right PronatorTeres Median C6-7 Nml Nml Nml Nml Nml 0 Nml Nml   Right Biceps Musculocut C5-6 Nml Nml Nml Nml Nml 0 Nml Nml   Right Deltoid Axillary C5-6 Nml Nml Nml Nml Nml 0 Nml Nml   Left Abd Poll Brev Median C8-T1 Nml Nml Nml Nml Nml 0 Nml Nml   Left 1stDorInt Ulnar C8-T1 Nml Nml Nml Nml Nml 0 Nml Nml   Left PronatorTeres Median C6-7 Nml Nml Nml Nml Nml 0 Nml Nml   Left Biceps Musculocut C5-6 Nml Nml Nml Nml Nml 0 Nml Nml   Left Deltoid Axillary C5-6 Nml Nml Nml Nml Nml 0 Nml Nml     Nerve Conduction Studies Anti Sensory Left/Right Comparison   Stim Site L Lat (ms) R Lat (ms) L-R Lat (ms) L Amp (V) R Amp (V) L-R Amp (%) Site1 Site2 L Vel (m/s) R Vel (m/s) L-R Vel (m/s)  Median Acr Palm Anti Sensory (2nd Digit)  31.3C  Wrist 3.3 3.4 0.1 33.4 30.9 7.5 Wrist Palm     Palm 1.8 2.0 0.2 22.4 25.8 13.2        Radial Anti Sensory (Base 1st Digit)  30.9C  Wrist 2.3 2.7 0.4 40.6 8.9 78.1 Wrist Base 1st Digit     Ulnar Anti Sensory (5th Digit)  31.4C  Wrist 3.1 3.2 0.1 27.8 24.4 12.2 Wrist 5th Digit 45 44 1   Motor Left/Right Comparison   Stim Site L Lat (ms) R Lat (ms) L-R Lat (ms) L Amp (mV) R Amp (mV) L-R Amp (%) Site1 Site2 L Vel (m/s) R Vel (m/s) L-R Vel (m/s)  Median Motor (Abd Poll Brev)  31.1C  Wrist 3.4 3.7 0.3 6.6 6.2 6.1 Elbow Wrist 51 51 0  Elbow 7.5 8.0 0.5 7.0 6.0 14.3       Ulnar Motor (Abd Dig Min)  31.4C  Wrist 3.4 3.4 0.0 8.3 8.6 3.5 B Elbow Wrist 56 54 2  B Elbow 6.9 7.0 0.1 7.7 8.0 3.7 A Elbow B Elbow 67 83 16  A Elbow 8.4 8.2 0.2 7.7 8.9 13.5          Waveforms:

## 2019-12-18 ENCOUNTER — Other Ambulatory Visit (HOSPITAL_COMMUNITY): Payer: Medicare Other

## 2019-12-21 ENCOUNTER — Other Ambulatory Visit: Payer: Self-pay | Admitting: Allergy & Immunology

## 2019-12-21 NOTE — Telephone Encounter (Signed)
Please advise to refill if its is appropriate or not?

## 2019-12-22 DIAGNOSIS — Z23 Encounter for immunization: Secondary | ICD-10-CM | POA: Diagnosis not present

## 2019-12-23 ENCOUNTER — Ambulatory Visit (HOSPITAL_COMMUNITY)
Admission: RE | Admit: 2019-12-23 | Discharge: 2019-12-23 | Disposition: A | Discharge status: Home / Self Care | Payer: Medicare Other | Source: Ambulatory Visit | Attending: Specialist | Admitting: Specialist

## 2019-12-23 ENCOUNTER — Other Ambulatory Visit: Payer: Self-pay

## 2019-12-23 ENCOUNTER — Telehealth: Payer: Self-pay

## 2019-12-23 DIAGNOSIS — M5135 Other intervertebral disc degeneration, thoracolumbar region: Secondary | ICD-10-CM | POA: Insufficient documentation

## 2019-12-23 DIAGNOSIS — M4015 Other secondary kyphosis, thoracolumbar region: Secondary | ICD-10-CM

## 2019-12-23 DIAGNOSIS — M8589 Other specified disorders of bone density and structure, multiple sites: Secondary | ICD-10-CM | POA: Diagnosis not present

## 2019-12-23 DIAGNOSIS — S32010S Wedge compression fracture of first lumbar vertebra, sequela: Secondary | ICD-10-CM | POA: Insufficient documentation

## 2019-12-23 DIAGNOSIS — R29890 Loss of height: Secondary | ICD-10-CM | POA: Diagnosis not present

## 2019-12-23 DIAGNOSIS — M48062 Spinal stenosis, lumbar region with neurogenic claudication: Secondary | ICD-10-CM

## 2019-12-23 DIAGNOSIS — Z780 Asymptomatic menopausal state: Secondary | ICD-10-CM | POA: Diagnosis not present

## 2019-12-23 NOTE — Telephone Encounter (Signed)
Tanzania from [facility] called she wants to see if 1 image is okay instead of 2 images (due to protocol ) she would like to know so she can let patient know ahead of time.she would like a call back:[phone]

## 2019-12-23 NOTE — Telephone Encounter (Signed)
I called and advised [name] that he does prefer the 2 view, she states that she has scheduled the patient to come back tomorrow for the 2nd view

## 2019-12-24 DIAGNOSIS — Z981 Arthrodesis status: Secondary | ICD-10-CM | POA: Diagnosis not present

## 2019-12-24 DIAGNOSIS — M533 Sacrococcygeal disorders, not elsewhere classified: Secondary | ICD-10-CM | POA: Diagnosis not present

## 2019-12-28 ENCOUNTER — Other Ambulatory Visit: Payer: Self-pay | Admitting: Specialist

## 2020-01-08 DIAGNOSIS — M5136 Other intervertebral disc degeneration, lumbar region: Secondary | ICD-10-CM | POA: Diagnosis not present

## 2020-01-14 ENCOUNTER — Other Ambulatory Visit: Payer: Self-pay

## 2020-01-14 ENCOUNTER — Ambulatory Visit: Payer: Medicare Other | Admitting: Specialist

## 2020-01-14 ENCOUNTER — Encounter: Payer: Self-pay | Admitting: Specialist

## 2020-01-14 VITALS — BP 132/76 | HR 66 | Ht 67.0 in | Wt 171.0 lb

## 2020-01-14 DIAGNOSIS — S32010S Wedge compression fracture of first lumbar vertebra, sequela: Secondary | ICD-10-CM | POA: Diagnosis not present

## 2020-01-14 DIAGNOSIS — M5135 Other intervertebral disc degeneration, thoracolumbar region: Secondary | ICD-10-CM

## 2020-01-14 DIAGNOSIS — M48062 Spinal stenosis, lumbar region with neurogenic claudication: Secondary | ICD-10-CM

## 2020-01-14 DIAGNOSIS — Z981 Arthrodesis status: Secondary | ICD-10-CM | POA: Diagnosis not present

## 2020-01-14 DIAGNOSIS — M960 Pseudarthrosis after fusion or arthrodesis: Secondary | ICD-10-CM

## 2020-01-14 DIAGNOSIS — M4015 Other secondary kyphosis, thoracolumbar region: Secondary | ICD-10-CM

## 2020-01-14 DIAGNOSIS — Z9689 Presence of other specified functional implants: Secondary | ICD-10-CM

## 2020-01-14 NOTE — Patient Instructions (Signed)
Avoid bending, stooping and avoid lifting weights greater than 10 lbs. Avoid prolong standing and walking. Order for a new walker with wheels. Surgery scheduling secretary Kandice Hams, will call you in the next week to schedule for surgery.  Surgery recommended is removal of the spinal cord stimulator and its battery and a 4 level  thoracolumbar fusion T9-10 to L2-3 this would be done with rods, screws and cages with local bone graft and allograft (donor bone graft). Removal of hooks and rods at L1 to L2. Possible use of cement or wires. Risk of surgery includes risk of infection 1 in 200 patients, bleeding 1/2% chance you would need a transfusion.   Risk to the nerves is one in 10,000. You will need to use a brace for 3 months and wean from the brace on the 4th month. Expect improved walking and standing tolerance. Expect relief of leg pain but numbness may persist depending on the length and degree of pressure that has been present.

## 2020-01-14 NOTE — Progress Notes (Signed)
Office Visit Note   Patient: [name]           Date of Birth: [birth year]           MRN: [id number] Visit Date: 01/14/2020              Requested by: Caryl Bis, MD [place],  [place] [address] PCP: Caryl Bis, MD   Assessment & Plan: Visit Diagnoses:  1. Other secondary kyphosis, thoracolumbar region   2. Closed compression fracture of L1 vertebra, sequela   3. Spinal stenosis of lumbar region with neurogenic claudication   4. DDD (degenerative disc disease), thoracolumbar   5. Arthrodesis status     Plan: Avoid bending, stooping and avoid lifting weights greater than 10 lbs. Avoid prolong standing and walking. Order for a new walker with wheels. Surgery scheduling secretary Kandice Hams, will call you in the next week to schedule for surgery.  Surgery recommended is removal of the spinal cord stimulator and its battery and a 4 level  thoracolumbar fusion T9-10 to L2-3 this would be done with rods, screws and cages with local bone graft and allograft (donor bone graft). Removal of hooks and rods at L1 to L2. Possible use of cement or wires. Risk of surgery includes risk of infection 1 in 200 patients, bleeding 1/2% chance you would need a transfusion.   Risk to the nerves is one in 10,000. You will need to use a brace for 3 months and wean from the brace on the 4th month. Expect improved walking and standing tolerance. Expect relief of leg pain but numbness may persist depending on the length and degree of pressure that has been present.   Follow-Up Instructions: Return in about 4 weeks (around 02/11/2020).   Orders:  No orders of the defined types were placed in this encounter.  No orders of the defined types were placed in this encounter.     Procedures: No procedures performed   Clinical Data: Findings:  Scoliosis radiographs performed standing demonstrate forward stoop with sagittal alignment measured from the C7 mid cervical level to  the  S1 level demonstrating nearly 10 cm of forward shift. No significant lateral curve.  Thoracic mylegram show leads for the spinal cord stimulator are lateral to the left at the T8-9 level and may be causing nerve root irritation near the lateral recess and neuroforamen left T9.     Subjective: Chief Complaint  Patient presents with  . Lower Back - Pain    64 year old female with history of long lumbar fusion with adjacent level degenerative disc disease and compression deformity at L1 above  L2 to S1 fusion. No bowel or bladder difficulty. She has pain that is a 8-10 of 10. Unable to tolerate narcotics and takes only tylenol. She is not able to walk a distance. She has pain into the left mid to lateral posterior thorax at about T7-T9. She is unable to stand in one place or  To walk a distance greater than 100 feet. She is tending to stoop and hold onto the buggy. Had EMG/NCV of the arms and bone density and  Scoliosis xrays.    Review of Systems  Constitutional: Negative.   HENT: Negative.   Eyes: Negative.   Respiratory: Negative.   Cardiovascular: Positive for chest pain.  Gastrointestinal: Negative.   Endocrine: Negative.   Genitourinary: Negative.   Musculoskeletal: Positive for back pain and gait problem.  Skin: Negative.   Allergic/Immunologic:  Negative.   Neurological: Positive for weakness and numbness.  Hematological: Negative.   Psychiatric/Behavioral: Positive for sleep disturbance. Negative for agitation, behavioral problems, confusion, decreased concentration, dysphoric mood, hallucinations, self-injury and suicidal ideas. The patient is not nervous/anxious and is not hyperactive.      Objective: Vital Signs: BP 132/76   Pulse 66   Ht 5\' 7"  (1.702 m)   Wt 171 lb (77.6 kg)   BMI 26.78 kg/m   Physical Exam Constitutional:      Appearance: She is well-developed.  HENT:     Head: Normocephalic and atraumatic.  Eyes:     Pupils: Pupils are equal, round,  and reactive to light.  Pulmonary:     Effort: Pulmonary effort is normal.     Breath sounds: Normal breath sounds.  Abdominal:     General: Bowel sounds are normal.     Palpations: Abdomen is soft.  Musculoskeletal:     Cervical back: Normal range of motion and neck supple.     Lumbar back: Negative right straight leg raise test and negative left straight leg raise test.  Skin:    General: Skin is warm and dry.  Neurological:     Mental Status: She is alert and oriented to person, place, and time.  Psychiatric:        Behavior: Behavior normal.        Thought Content: Thought content normal.        Judgment: Judgment normal.     Back Exam   Tenderness  The patient is experiencing tenderness in the lumbar and thoracic.  Range of Motion  Extension: abnormal  Flexion: abnormal  Lateral bend right: abnormal  Lateral bend left: abnormal  Rotation right: abnormal  Rotation left: abnormal   Muscle Strength  Right Quadriceps:  5/5  Left Quadriceps:  5/5  Right Hamstrings:  5/5  Left Hamstrings:  5/5   Tests  Straight leg raise right: negative Straight leg raise left: negative  Reflexes  Patellar: 2/4 Achilles: 2/4 Babinski's sign: normal   Other  Toe walk: normal Heel walk: normal Sensation: normal Gait: normal  Erythema: no back redness Scars: present  Comments:  Motor is normal in the legs.       Specialty Comments:  No specialty comments available.  Imaging: No results found.   PMFS History: Patient Active Problem List   Diagnosis Date Noted  . Pseudarthrosis after fusion or arthrodesis 08/09/2014    Priority: High    Class: Chronic  . Painful orthopaedic hardware ([facility]) 08/09/2014    Priority: High    Class: Chronic  . Spinal stenosis, lumbar region, with neurogenic claudication 04/07/2013    Priority: High    Class: Chronic  . Degenerative spondylolisthesis 04/07/2013    Priority: High    Class: Chronic  . Ileus, postoperative ([facility])  08/13/2014    Priority: Medium    Class: Acute  . Seasonal and perennial allergic rhinitis 02/18/2019  . Vulvar cysts 04/26/2014  . Spinal stenosis of lumbar region with neurogenic claudication 04/07/2013   Past Medical History:  Diagnosis Date  . Arthritis   . Asthma   . Cancer (HCC)    cervical , skin  . Cold    recent  rx  . Fibromyalgia   . GERD (gastroesophageal reflux disease)   . H/O hiatal hernia   . Hypertension   . PONV (postoperative nausea and vomiting)   . Shortness of breath   . Sleep apnea    ' mild "  does not use cpap    Family History  Problem Relation Age of Onset  . Allergic rhinitis Neg Hx   . Angioedema Neg Hx   . Asthma Neg Hx   . Atopy Neg Hx   . Eczema Neg Hx   . Immunodeficiency Neg Hx   . Urticaria Neg Hx     Past Surgical History:  Procedure Laterality Date  . ABDOMINAL HYSTERECTOMY  04  . APPENDECTOMY    . BACK SURGERY     x3  . CERVICAL CONIZATION W/BX    . CERVICAL DISC SURGERY  06  . CHOLECYSTECTOMY  03  . LUMBAR FUSION  04/07/2013   L 2 L3 L4    with rod     DR Breanne Olvera  . TONSILLECTOMY     Social History   Occupational History  . Not on file  Tobacco Use  . Smoking status: Never Smoker  . Smokeless tobacco: Never Used  Vaping Use  . Vaping Use: Never used  Substance and Sexual Activity  . Alcohol use: No  . Drug use: No  . Sexual activity: Yes    Birth control/protection: Surgical

## 2020-01-15 ENCOUNTER — Encounter: Payer: Medicare Other | Admitting: Physical Medicine and Rehabilitation

## 2020-01-19 DIAGNOSIS — J019 Acute sinusitis, unspecified: Secondary | ICD-10-CM | POA: Diagnosis not present

## 2020-01-20 ENCOUNTER — Ambulatory Visit: Payer: Medicare Other | Admitting: Specialist

## 2020-01-21 DIAGNOSIS — Z23 Encounter for immunization: Secondary | ICD-10-CM | POA: Diagnosis not present

## 2020-01-22 DIAGNOSIS — K219 Gastro-esophageal reflux disease without esophagitis: Secondary | ICD-10-CM | POA: Diagnosis not present

## 2020-01-22 DIAGNOSIS — Z1329 Encounter for screening for other suspected endocrine disorder: Secondary | ICD-10-CM | POA: Diagnosis not present

## 2020-01-22 DIAGNOSIS — I10 Essential (primary) hypertension: Secondary | ICD-10-CM | POA: Diagnosis not present

## 2020-01-22 DIAGNOSIS — E876 Hypokalemia: Secondary | ICD-10-CM | POA: Diagnosis not present

## 2020-01-22 DIAGNOSIS — E782 Mixed hyperlipidemia: Secondary | ICD-10-CM | POA: Diagnosis not present

## 2020-01-27 ENCOUNTER — Telehealth: Payer: Self-pay

## 2020-01-27 DIAGNOSIS — E876 Hypokalemia: Secondary | ICD-10-CM | POA: Diagnosis not present

## 2020-01-27 DIAGNOSIS — Z1389 Encounter for screening for other disorder: Secondary | ICD-10-CM | POA: Diagnosis not present

## 2020-01-27 DIAGNOSIS — E782 Mixed hyperlipidemia: Secondary | ICD-10-CM | POA: Diagnosis not present

## 2020-01-27 DIAGNOSIS — I10 Essential (primary) hypertension: Secondary | ICD-10-CM | POA: Diagnosis not present

## 2020-01-27 DIAGNOSIS — M7062 Trochanteric bursitis, left hip: Secondary | ICD-10-CM | POA: Diagnosis not present

## 2020-01-27 DIAGNOSIS — G43909 Migraine, unspecified, not intractable, without status migrainosus: Secondary | ICD-10-CM | POA: Diagnosis not present

## 2020-01-27 DIAGNOSIS — Z0001 Encounter for general adult medical examination with abnormal findings: Secondary | ICD-10-CM | POA: Diagnosis not present

## 2020-01-27 NOTE — Telephone Encounter (Signed)
Amy with PCP's office called checking on status of surgery.  Need surgery sheet please.  Thanks!

## 2020-01-28 NOTE — Telephone Encounter (Signed)
I started it and gave form to Dr. Louanne Skye

## 2020-02-02 DIAGNOSIS — N183 Chronic kidney disease, stage 3 unspecified: Secondary | ICD-10-CM | POA: Diagnosis not present

## 2020-02-02 DIAGNOSIS — Z1159 Encounter for screening for other viral diseases: Secondary | ICD-10-CM | POA: Diagnosis not present

## 2020-02-02 DIAGNOSIS — I10 Essential (primary) hypertension: Secondary | ICD-10-CM | POA: Diagnosis not present

## 2020-02-02 DIAGNOSIS — E876 Hypokalemia: Secondary | ICD-10-CM | POA: Diagnosis not present

## 2020-02-11 ENCOUNTER — Ambulatory Visit: Payer: Medicare Other | Admitting: Specialist

## 2020-02-11 ENCOUNTER — Other Ambulatory Visit: Payer: Self-pay

## 2020-02-11 ENCOUNTER — Encounter: Payer: Self-pay | Admitting: Specialist

## 2020-02-11 VITALS — BP 124/75 | HR 65 | Ht 67.0 in | Wt 171.0 lb

## 2020-02-11 DIAGNOSIS — M5135 Other intervertebral disc degeneration, thoracolumbar region: Secondary | ICD-10-CM

## 2020-02-11 DIAGNOSIS — M4015 Other secondary kyphosis, thoracolumbar region: Secondary | ICD-10-CM

## 2020-02-11 DIAGNOSIS — S32010S Wedge compression fracture of first lumbar vertebra, sequela: Secondary | ICD-10-CM

## 2020-02-11 DIAGNOSIS — M48062 Spinal stenosis, lumbar region with neurogenic claudication: Secondary | ICD-10-CM

## 2020-02-11 DIAGNOSIS — Z9689 Presence of other specified functional implants: Secondary | ICD-10-CM

## 2020-02-11 DIAGNOSIS — M960 Pseudarthrosis after fusion or arthrodesis: Secondary | ICD-10-CM

## 2020-02-11 NOTE — Progress Notes (Signed)
Office Visit Note   Patient: [name]           Date of Birth: [birth year]           MRN: [id number] Visit Date: 02/11/2020              Requested by: Caryl Bis, MD [place],  [place] [address] PCP: Caryl Bis, MD   Assessment & Plan: Visit Diagnoses:  1. Other secondary kyphosis, thoracolumbar region   2. Closed compression fracture of L1 vertebra, sequela   3. Spinal stenosis of lumbar region with neurogenic claudication   4. DDD (degenerative disc disease), thoracolumbar   5. S/P insertion of spinal cord stimulator   6. Pseudarthrosis after fusion or arthrodesis     Plan: Plan: Avoid bending, stooping and avoid lifting weights greater than 10 lbs. Avoid prolong standing and walking. Order for a new walker with wheels. Surgery scheduling secretary Kandice Hams, will call you in the next week to schedule for surgery.  Surgery recommended is removal of the spinal cord stimulator and its battery and a 4 level  thoracolumbar fusion T9-10 to L2-3 this would be done with rods, screws and cages with local bone graft and allograft (donor bone graft). Removal of hooks and rods at L1 to L2. Possible use of cement or wires. Risk of surgery includes risk of infection 1 in 200 patients, bleeding 1/2% chance you would need a transfusion.   Risk to the nerves is one in 10,000. You will need to use a brace for 3 months and wean from the brace on the 4th month. Expect improved walking and standing tolerance. Expect relief of leg pain but numbness may persist depending on the length and degree of pressure that has been present.    Follow-Up Instructions: No follow-ups on file.   Orders:  No orders of the defined types were placed in this encounter.  No orders of the defined types were placed in this encounter.     Procedures: No procedures performed   Clinical Data: No additional findings.   Subjective: Chief Complaint  Patient presents with  .  Lower Back - Follow-up    64 year old female with history of adjacent level DDD L1-2 and L2-3 above L1 toS1 fusion with use of sublaminar hooks at the superior 2 levels due to small pedicle. She has persistent pain and surgery is scheduled to extend the fusion to T9 with TLIF at The L2-3 and L1-2 levels. She is ready to have the surgery but it is scheduled for nearly One month from now. No bowel or bladder dysfunction has pain and numbness and sharp needle like sensation into the big toes.    Review of Systems  Constitutional: Negative.   HENT: Negative.   Eyes: Negative.   Respiratory: Negative.   Cardiovascular: Negative.   Gastrointestinal: Negative.   Endocrine: Negative.   Genitourinary: Negative.   Musculoskeletal: Negative.   Skin: Negative.   Allergic/Immunologic: Negative.   Neurological: Negative.   Hematological: Negative.   Psychiatric/Behavioral: Negative.      Objective: Vital Signs: BP 124/75 (BP Location: Left Arm, Patient Position: Sitting)   Pulse 65   Ht 5\' 7"  (1.702 m)   Wt 171 lb (77.6 kg)   BMI 26.78 kg/m   Physical Exam Constitutional:      Appearance: She is well-developed.  HENT:     Head: Normocephalic and atraumatic.  Eyes:     Pupils: Pupils are equal,  round, and reactive to light.  Pulmonary:     Effort: Pulmonary effort is normal.     Breath sounds: Normal breath sounds.  Abdominal:     General: Bowel sounds are normal.     Palpations: Abdomen is soft.  Musculoskeletal:     Cervical back: Normal range of motion and neck supple.     Lumbar back: Negative left straight leg raise test.  Skin:    General: Skin is warm and dry.  Neurological:     Mental Status: She is alert and oriented to person, place, and time.  Psychiatric:        Behavior: Behavior normal.        Thought Content: Thought content normal.        Judgment: Judgment normal.     Back Exam   Tenderness  The patient is experiencing tenderness in the  lumbar.  Range of Motion  Extension: abnormal  Flexion: abnormal  Lateral bend right: abnormal  Lateral bend left: abnormal  Rotation right: abnormal  Rotation left: abnormal   Muscle Strength  Right Quadriceps:  5/5  Left Quadriceps:  5/5  Right Hamstrings:  5/5  Left Hamstrings:  5/5   Tests  Straight leg raise left: negative      Specialty Comments:  No specialty comments available.  Imaging: No results found.   PMFS History: Patient Active Problem List   Diagnosis Date Noted  . Pseudarthrosis after fusion or arthrodesis 08/09/2014    Priority: High    Class: Chronic  . Painful orthopaedic hardware ([place]) 08/09/2014    Priority: High    Class: Chronic  . Spinal stenosis, lumbar region, with neurogenic claudication 04/07/2013    Priority: High    Class: Chronic  . Degenerative spondylolisthesis 04/07/2013    Priority: High    Class: Chronic  . Ileus, postoperative ([place]) 08/13/2014    Priority: Medium    Class: Acute  . Seasonal and perennial allergic rhinitis 02/18/2019  . Vulvar cysts 04/26/2014  . Spinal stenosis of lumbar region with neurogenic claudication 04/07/2013   Past Medical History:  Diagnosis Date  . Arthritis   . Asthma   . Cancer (HCC)    cervical , skin  . Cold    recent  rx  . Fibromyalgia   . GERD (gastroesophageal reflux disease)   . H/O hiatal hernia   . Hypertension   . PONV (postoperative nausea and vomiting)   . Shortness of breath   . Sleep apnea    ' mild " does not use cpap    Family History  Problem Relation Age of Onset  . Allergic rhinitis Neg Hx   . Angioedema Neg Hx   . Asthma Neg Hx   . Atopy Neg Hx   . Eczema Neg Hx   . Immunodeficiency Neg Hx   . Urticaria Neg Hx     Past Surgical History:  Procedure Laterality Date  . ABDOMINAL HYSTERECTOMY  04  . APPENDECTOMY    . BACK SURGERY     x3  . CERVICAL CONIZATION W/BX    . CERVICAL DISC SURGERY  06  . CHOLECYSTECTOMY  03  . LUMBAR FUSION  04/07/2013    L 2 L3 L4    with rod     DR Louie Flenner  . TONSILLECTOMY     Social History   Occupational History  . Not on file  Tobacco Use  . Smoking status: Never Smoker  . Smokeless tobacco: Never Used  Vaping  Use  . Vaping Use: Never used  Substance and Sexual Activity  . Alcohol use: No  . Drug use: No  . Sexual activity: Yes    Birth control/protection: Surgical

## 2020-02-11 NOTE — Patient Instructions (Signed)
Plan: Avoid bending, stooping and avoid lifting weights greater than 10 lbs. Avoid prolong standing and walking. Order for a new walker with wheels. Surgery scheduling secretary Kandice Hams, will call you in the next week to schedule for surgery.  Surgery recommended is removal of the spinal cord stimulator and its battery and a 4 level  thoracolumbar fusion T9-10 to L2-3 this would be done with rods, screws and cages with local bone graft and allograft (donor bone graft). Removal of hooks and rods at L1 to L2. Possible use of cement or wires. Risk of surgery includes risk of infection 1 in 200 patients, bleeding 1/2% chance you would need a transfusion.   Risk to the nerves is one in 10,000. You will need to use a brace for 3 months and wean from the brace on the 4th month. Expect improved walking and standing tolerance. Expect relief of leg pain but numbness may persist depending on the length and degree of pressure that has been present.

## 2020-02-24 DIAGNOSIS — J329 Chronic sinusitis, unspecified: Secondary | ICD-10-CM | POA: Diagnosis not present

## 2020-02-24 DIAGNOSIS — R059 Cough, unspecified: Secondary | ICD-10-CM | POA: Diagnosis not present

## 2020-02-25 ENCOUNTER — Encounter: Payer: Self-pay | Admitting: Surgery

## 2020-02-25 ENCOUNTER — Ambulatory Visit: Payer: Medicare Other | Admitting: Surgery

## 2020-02-25 VITALS — BP 133/81 | HR 69 | Temp 97.0°F

## 2020-02-25 DIAGNOSIS — Z9689 Presence of other specified functional implants: Secondary | ICD-10-CM

## 2020-02-25 DIAGNOSIS — Z981 Arthrodesis status: Secondary | ICD-10-CM

## 2020-02-26 ENCOUNTER — Other Ambulatory Visit: Payer: Self-pay

## 2020-03-03 ENCOUNTER — Telehealth: Payer: Self-pay | Admitting: Specialist

## 2020-03-03 NOTE — Telephone Encounter (Signed)
Per Dr. Louanne Skye he says that she will get one at the hospital if she dont have one, she states that she has one but it don't have wheels on it that he wants her to have.

## 2020-03-03 NOTE — Progress Notes (Signed)
[facility] [address] [place] [address] Phone: [phone] Fax: [phone]      Your procedure is scheduled on March 08, 2020.  Report to [facility] Main Entrance "A" at 05:30 A.M., and check in at the Admitting office.  Call this number if you have problems the morning of surgery:  [phone]  Call [phone] if you have any questions prior to your surgery date Monday-Friday 8am-4pm    Remember:  Do not eat or drink after midnight the night before your surgery    Take these medicines the morning of surgery with A SIP OF WATER : Cetirizine (Zyrtec) Estradiol (Estrace) Fexofenadine (Allegra) Omeprazole (Prilosec) or Pantoprazole (Protonix)  If needed: Albuterol (Ventolin HFA)--bring inhaler with you the day of surgery Epi Pen  As of today, STOP taking any Aspirin (unless otherwise instructed by your surgeon) Aleve, Naproxen, Celecoxib (Celebrex), Ibuprofen, Motrin, Advil, Goody's, BC's, all herbal medications, fish oil, and all vitamins.                      Do not wear jewelry, make up, or nail polish            Do not wear lotions, powders, perfumes, or deodorant.            Do not shave 48 hours prior to surgery.              Do not bring valuables to the hospital.            [facility] is not responsible for any belongings or valuables.  Do NOT Smoke (Tobacco/Vaping) or drink Alcohol 24 hours prior to your procedure If you use a CPAP at night, you may bring all equipment for your overnight stay.   Contacts, glasses, dentures or bridgework may not be worn into surgery.      For patients admitted to the hospital, discharge time will be determined by your treatment team.   Patients discharged the day of surgery will not be allowed to drive home, and someone needs to stay with them for 24 hours.    Special instructions:   [place]- Preparing For Surgery  Before surgery, you can play an important  role. Because skin is not sterile, your skin needs to be as free of germs as possible. You can reduce the number of germs on your skin by washing with CHG (chlorahexidine gluconate) Soap before surgery.  CHG is an antiseptic cleaner which kills germs and bonds with the skin to continue killing germs even after washing.    Oral Hygiene is also important to reduce your risk of infection.  Remember - BRUSH YOUR TEETH THE MORNING OF SURGERY WITH YOUR REGULAR TOOTHPASTE  Please do not use if you have an allergy to CHG or antibacterial soaps. If your skin becomes reddened/irritated stop using the CHG.  Do not shave (including legs and underarms) for at least 48 hours prior to first CHG shower. It is OK to shave your face.  Please follow these instructions carefully.   1. Shower the NIGHT BEFORE SURGERY and the MORNING OF SURGERY with CHG Soap.   2. If you chose to wash your hair, wash your hair first as usual with your normal shampoo.  3. After you shampoo, rinse your hair and body thoroughly to remove the shampoo.  4. Use CHG as you would any other liquid soap. You can apply CHG directly to the skin  and wash gently with a scrungie or a clean washcloth.   5. Apply the CHG Soap to your body ONLY FROM THE NECK DOWN.  Do not use on open wounds or open sores. Avoid contact with your eyes, ears, mouth and genitals (private parts). Wash Face and genitals (private parts)  with your normal soap.   6. Wash thoroughly, paying special attention to the area where your surgery will be performed.  7. Thoroughly rinse your body with warm water from the neck down.  8. DO NOT shower/wash with your normal soap after using and rinsing off the CHG Soap.  9. Pat yourself dry with a CLEAN TOWEL.  10. Wear CLEAN PAJAMAS to bed the night before surgery  11. Place CLEAN SHEETS on your bed the night of your first shower and DO NOT SLEEP WITH PETS.   Day of Surgery: Wear Clean/Comfortable clothing the morning of  surgery Do not apply any deodorants/lotions.   Remember to brush your teeth WITH YOUR REGULAR TOOTHPASTE.   Please read over the following fact sheets that you were given.

## 2020-03-03 NOTE — Telephone Encounter (Signed)
Patient called asked if she will receive her new walker before she have surgery next week 03/08/2020. The number to contact patient is [phone]

## 2020-03-04 ENCOUNTER — Other Ambulatory Visit (HOSPITAL_COMMUNITY): Payer: Medicare Other

## 2020-03-04 ENCOUNTER — Other Ambulatory Visit: Payer: Self-pay

## 2020-03-04 ENCOUNTER — Encounter (HOSPITAL_COMMUNITY): Payer: Self-pay

## 2020-03-04 ENCOUNTER — Other Ambulatory Visit: Payer: Self-pay | Admitting: Specialist

## 2020-03-04 ENCOUNTER — Encounter (HOSPITAL_COMMUNITY)
Admission: RE | Admit: 2020-03-04 | Discharge: 2020-03-04 | Disposition: A | Discharge status: Home / Self Care | Payer: Medicare Other | Source: Ambulatory Visit | Attending: Specialist | Admitting: Specialist

## 2020-03-04 DIAGNOSIS — E876 Hypokalemia: Secondary | ICD-10-CM | POA: Insufficient documentation

## 2020-03-04 DIAGNOSIS — Z01818 Encounter for other preprocedural examination: Secondary | ICD-10-CM | POA: Diagnosis not present

## 2020-03-04 DIAGNOSIS — I10 Essential (primary) hypertension: Secondary | ICD-10-CM | POA: Insufficient documentation

## 2020-03-04 DIAGNOSIS — G4733 Obstructive sleep apnea (adult) (pediatric): Secondary | ICD-10-CM | POA: Diagnosis not present

## 2020-03-04 DIAGNOSIS — J4520 Mild intermittent asthma, uncomplicated: Secondary | ICD-10-CM | POA: Diagnosis not present

## 2020-03-04 LAB — BASIC METABOLIC PANEL
Anion gap: 12 (ref 5–15)
BUN: 11 mg/dL (ref 8–23)
CO2: 25 mmol/L (ref 22–32)
Calcium: 9.1 mg/dL (ref 8.9–10.3)
Chloride: 103 mmol/L (ref 98–111)
Creatinine, Ser: 0.81 mg/dL (ref 0.44–1.00)
GFR, Estimated: >60 mL/min (ref >60)
Glucose, Bld: 120 mg/dL — ABNORMAL HIGH (ref 70–99)
Potassium: 3 mmol/L — ABNORMAL LOW (ref 3.5–5.1)
Sodium: 140 mmol/L (ref 135–145)

## 2020-03-04 LAB — CBC
HCT: 37.9 % (ref 36.0–46.0)
Hemoglobin: 13.1 g/dL (ref 12.0–15.0)
MCH: 31.1 pg (ref 26.0–34.0)
MCHC: 34.6 g/dL (ref 30.0–36.0)
MCV: 90 fL (ref 80.0–100.0)
Platelets: 362 10*3/uL (ref 150–400)
RBC: 4.21 MIL/uL (ref 3.87–5.11)
RDW: 13.1 % (ref 11.5–15.5)
WBC: 10.5 10*3/uL (ref 4.0–10.5)
nRBC: 0 % (ref 0.0–0.2)

## 2020-03-04 LAB — SURGICAL PCR SCREEN
MRSA, PCR: NEGATIVE
Staphylococcus aureus: NEGATIVE

## 2020-03-04 NOTE — Progress Notes (Signed)
PCP - Dr. Gar Ponto Cardiologist - denies  PPM/ICD - denies  Chest x-ray - N/A EKG - 03/04/2020 Stress Test - denies ECHO - per patient "about 3-4 years ago"  - records requested from [facility] [address] in [place] - denies  Sleep Study - per patient diagnosed with mild sleep apnea, doesn't use CPAP   DM: denies   Blood Thinner Instructions: N/A Aspirin Instructions: N/A  ERAS Protcol - No pre-op orders  COVID TEST- Scheduled for 03/07/2020. Patient verbalized understanding of self-quarantine instructions, appointment time and place.  Anesthesia review: YES, cardiac testing requested Patient stated "was told had a leaky valve or something like that no follow-up was needed"  Patient denies shortness of breath, fever, cough and chest pain at PAT appointment  All instructions explained to the patient, with a verbal understanding of the material. Patient agrees to go over the instructions while at home for a better understanding. Patient also instructed to self quarantine after being tested for COVID-19. The opportunity to ask questions was provided.

## 2020-03-04 NOTE — Progress Notes (Signed)
Laboratory tests show potassium is low at 3.0 and she was called and advised to take potassium supplements to  Improve this. Her glucose is mildly elevated at 120 but better than in the past. Continue to be careful with avoidance of concentrated sweets and glucose.

## 2020-03-07 ENCOUNTER — Other Ambulatory Visit (HOSPITAL_COMMUNITY)
Admission: RE | Admit: 2020-03-07 | Discharge: 2020-03-07 | Disposition: A | Discharge status: Home / Self Care | Payer: Medicare Other | Source: Ambulatory Visit | Attending: Specialist | Admitting: Specialist

## 2020-03-07 DIAGNOSIS — M5134 Other intervertebral disc degeneration, thoracic region: Secondary | ICD-10-CM | POA: Diagnosis not present

## 2020-03-07 DIAGNOSIS — G9741 Accidental puncture or laceration of dura during a procedure: Secondary | ICD-10-CM | POA: Diagnosis not present

## 2020-03-07 DIAGNOSIS — M4005 Postural kyphosis, thoracolumbar region: Secondary | ICD-10-CM | POA: Diagnosis not present

## 2020-03-07 DIAGNOSIS — M4326 Fusion of spine, lumbar region: Secondary | ICD-10-CM | POA: Diagnosis not present

## 2020-03-07 DIAGNOSIS — M5135 Other intervertebral disc degeneration, thoracolumbar region: Secondary | ICD-10-CM | POA: Diagnosis not present

## 2020-03-07 DIAGNOSIS — R112 Nausea with vomiting, unspecified: Secondary | ICD-10-CM | POA: Diagnosis not present

## 2020-03-07 DIAGNOSIS — M40205 Unspecified kyphosis, thoracolumbar region: Secondary | ICD-10-CM | POA: Diagnosis not present

## 2020-03-07 DIAGNOSIS — Z882 Allergy status to sulfonamides status: Secondary | ICD-10-CM | POA: Diagnosis not present

## 2020-03-07 DIAGNOSIS — M48062 Spinal stenosis, lumbar region with neurogenic claudication: Secondary | ICD-10-CM | POA: Diagnosis not present

## 2020-03-07 DIAGNOSIS — M960 Pseudarthrosis after fusion or arthrodesis: Secondary | ICD-10-CM | POA: Diagnosis not present

## 2020-03-07 DIAGNOSIS — I10 Essential (primary) hypertension: Secondary | ICD-10-CM | POA: Diagnosis not present

## 2020-03-07 DIAGNOSIS — M4310 Spondylolisthesis, site unspecified: Secondary | ICD-10-CM | POA: Diagnosis not present

## 2020-03-07 DIAGNOSIS — K449 Diaphragmatic hernia without obstruction or gangrene: Secondary | ICD-10-CM | POA: Diagnosis not present

## 2020-03-07 DIAGNOSIS — Z9682 Presence of neurostimulator: Secondary | ICD-10-CM | POA: Diagnosis not present

## 2020-03-07 DIAGNOSIS — M4325 Fusion of spine, thoracolumbar region: Secondary | ICD-10-CM | POA: Diagnosis not present

## 2020-03-07 DIAGNOSIS — Z888 Allergy status to other drugs, medicaments and biological substances status: Secondary | ICD-10-CM | POA: Diagnosis not present

## 2020-03-07 DIAGNOSIS — Z01812 Encounter for preprocedural laboratory examination: Secondary | ICD-10-CM | POA: Insufficient documentation

## 2020-03-07 DIAGNOSIS — T8484XA Pain due to internal orthopedic prosthetic devices, implants and grafts, initial encounter: Secondary | ICD-10-CM | POA: Diagnosis not present

## 2020-03-07 DIAGNOSIS — Z90710 Acquired absence of both cervix and uterus: Secondary | ICD-10-CM | POA: Diagnosis not present

## 2020-03-07 DIAGNOSIS — Z20822 Contact with and (suspected) exposure to covid-19: Secondary | ICD-10-CM | POA: Insufficient documentation

## 2020-03-07 DIAGNOSIS — Z881 Allergy status to other antibiotic agents status: Secondary | ICD-10-CM | POA: Diagnosis not present

## 2020-03-07 DIAGNOSIS — Y838 Other surgical procedures as the cause of abnormal reaction of the patient, or of later complication, without mention of misadventure at the time of the procedure: Secondary | ICD-10-CM | POA: Diagnosis present

## 2020-03-07 DIAGNOSIS — T85840A Pain due to nervous system prosthetic devices, implants and grafts, initial encounter: Secondary | ICD-10-CM | POA: Diagnosis not present

## 2020-03-07 DIAGNOSIS — M4316 Spondylolisthesis, lumbar region: Secondary | ICD-10-CM | POA: Diagnosis not present

## 2020-03-07 DIAGNOSIS — Z981 Arthrodesis status: Secondary | ICD-10-CM | POA: Diagnosis not present

## 2020-03-07 DIAGNOSIS — Z4542 Encounter for adjustment and management of neuropacemaker (brain) (peripheral nerve) (spinal cord): Secondary | ICD-10-CM | POA: Diagnosis not present

## 2020-03-07 DIAGNOSIS — Z9889 Other specified postprocedural states: Secondary | ICD-10-CM | POA: Diagnosis not present

## 2020-03-07 DIAGNOSIS — Z885 Allergy status to narcotic agent status: Secondary | ICD-10-CM | POA: Diagnosis not present

## 2020-03-07 DIAGNOSIS — G4730 Sleep apnea, unspecified: Secondary | ICD-10-CM | POA: Diagnosis not present

## 2020-03-07 DIAGNOSIS — M47895 Other spondylosis, thoracolumbar region: Secondary | ICD-10-CM | POA: Diagnosis not present

## 2020-03-07 DIAGNOSIS — M797 Fibromyalgia: Secondary | ICD-10-CM | POA: Diagnosis not present

## 2020-03-07 DIAGNOSIS — M1990 Unspecified osteoarthritis, unspecified site: Secondary | ICD-10-CM | POA: Diagnosis not present

## 2020-03-07 DIAGNOSIS — M40295 Other kyphosis, thoracolumbar region: Secondary | ICD-10-CM | POA: Diagnosis not present

## 2020-03-07 DIAGNOSIS — K219 Gastro-esophageal reflux disease without esophagitis: Secondary | ICD-10-CM | POA: Diagnosis not present

## 2020-03-07 DIAGNOSIS — Z9049 Acquired absence of other specified parts of digestive tract: Secondary | ICD-10-CM | POA: Diagnosis not present

## 2020-03-07 DIAGNOSIS — M5116 Intervertebral disc disorders with radiculopathy, lumbar region: Secondary | ICD-10-CM | POA: Diagnosis not present

## 2020-03-07 DIAGNOSIS — Z79890 Hormone replacement therapy (postmenopausal): Secondary | ICD-10-CM | POA: Diagnosis not present

## 2020-03-07 DIAGNOSIS — Z85828 Personal history of other malignant neoplasm of skin: Secondary | ICD-10-CM | POA: Diagnosis not present

## 2020-03-07 NOTE — Progress Notes (Signed)
Anesthesia Chart Review:  Preop labs show mild hypokalemia with potassium 3.0.  Dr. Louanne Skye is aware, is advised patient to start potassium supplementation.  Remainder of preop labs unremarkable.  Follows with allergy and asthma for history of mild intermittent asthma, controlled with albuterol as needed.  Patient reports history of mild OSA, not on CPAP.  EKG done at preadmission testing was abnormal, showed anterior T wave inversions.  Last EKG in epic from 08/04/2014 showed flattening of the anterior T waves.   Spoke with patient to clarify functional status. She states she is currently limited by severe back pain. However, she says that 6 months ago before her pain worsened, she was able to push mow her grass and be active without cardiopulmonary limitations. She denies any history of chest pain. She has occasional SOB related to asthma, but reports she only needs to user her albuterol inhaler rarely, symptoms are stable. Reviewed patient history and tracings with Dr. Doroteo Glassman.  Advised that in the absence of any new cardiopulmonary symptoms patient can proceed with surgery as planned.  TTE 09/11/2016 (copy on chart): Conclusions: 1.  There is normal left ventricular systolic function. 2.  The estimated ejection fraction is 60-65%. 3.  Normal left ventricular diastolic filling is observed. 4.  Right ventricular global systolic function normal. 5.  There is mild mitral regurgitation.  Nuclear stress test 12/30/12 [facility]): Showed no evidence of inducible ischemia, normal LV systolic function and wall motion, EF 77%, non-diagnostic ECG portion due to baseline ECG abnormalities. Of note, the calculated TID ration is at the upper limits of normal. Visually, the LV cavity does not appear to dilate with vasodilator administration. (Report can be found in correspondence dated 04/11/2013 under media tab).    Wynonia Musty [facility] Short Stay Center/Anesthesiology Phone [phone] 03/07/2020 12:25 PM

## 2020-03-07 NOTE — Anesthesia Preprocedure Evaluation (Addendum)
Anesthesia Evaluation  Patient identified by MRN, date of birth, ID band Patient awake    Reviewed: Allergy & Precautions, NPO status , Patient's Chart, lab work & pertinent test results  Airway Mallampati: II  TM Distance: >3 FB Neck ROM: Full    Dental  (+) Edentulous Upper, Partial Lower, Dental Advisory Given   Pulmonary    breath sounds clear to auscultation       Cardiovascular hypertension,  Rhythm:Regular Rate:Normal     Neuro/Psych    GI/Hepatic   Endo/Other    Renal/GU      Musculoskeletal   Abdominal   Peds  Hematology   Anesthesia Other Findings   Reproductive/Obstetrics                            Anesthesia Physical Anesthesia Plan  ASA: III  Anesthesia Plan: General   Post-op Pain Management:    Induction: Intravenous  PONV Risk Score and Plan: Ondansetron and Dexamethasone  Airway Management Planned: Oral ETT  Additional Equipment: Arterial line  Intra-op Plan:   Post-operative Plan: Extubation in OR  Informed Consent: I have reviewed the patients History and Physical, chart, labs and discussed the procedure including the risks, benefits and alternatives for the proposed anesthesia with the patient or authorized representative who has indicated his/her understanding and acceptance.     Dental advisory given  Plan Discussed with: CRNA and Anesthesiologist  Anesthesia Plan Comments: (PAT note by Karoline Caldwell, PA-C: Preop labs show mild hypokalemia with potassium 3.0.  Dr. Louanne Skye is aware, is advised patient to start potassium supplementation.  Remainder of preop labs unremarkable.  Follows with allergy and asthma for history of mild intermittent asthma, controlled with albuterol as needed.  Patient reports history of mild OSA, not on CPAP.  EKG done at preadmission testing was abnormal, showed anterior T wave inversions.  Last EKG in epic from 08/04/2014  showed flattening of the anterior T waves.   Spoke with patient to clarify functional status. She states she is currently limited by severe back pain. However, she says that 6 months ago before her pain worsened, she was able to push mow her grass and be active without cardiopulmonary limitations. She denies any history of chest pain. She has occasional SOB related to asthma, but reports she only needs to user her albuterol inhaler rarely, symptoms are stable. Reviewed patient history and tracings with Dr. Doroteo Glassman.  Advised that in the absence of any new cardiopulmonary symptoms patient can proceed with surgery as planned.  TTE 09/11/2016 (copy on chart): Conclusions: 1.  There is normal left ventricular systolic function. 2.  The estimated ejection fraction is 60-65%. 3.  Normal left ventricular diastolic filling is observed. 4.  Right ventricular global systolic function normal. 5.  There is mild mitral regurgitation.  Nuclear stress test 12/30/12 [facility]): Showed no evidence of inducible ischemia, normal LV systolic function and wall motion, EF 77%, non-diagnostic ECG portion due to baseline ECG abnormalities. Of note, the calculated TID ration is at the upper limits of normal. Visually, the LV cavity does not appear to dilate with vasodilator administration. (Report can be found in correspondence dated 04/11/2013 under media tab).  )       Anesthesia Quick Evaluation

## 2020-03-08 ENCOUNTER — Encounter: Payer: Self-pay | Admitting: Specialist

## 2020-03-08 ENCOUNTER — Encounter (HOSPITAL_COMMUNITY): Payer: Self-pay | Admitting: Specialist

## 2020-03-08 ENCOUNTER — Inpatient Hospital Stay (HOSPITAL_COMMUNITY)
Admission: RE | Admit: 2020-03-08 | Discharge: 2020-03-11 | DRG: 454 | Disposition: A | Discharge status: Home Health | Payer: Medicare Other | Attending: Specialist | Admitting: Specialist

## 2020-03-08 ENCOUNTER — Inpatient Hospital Stay (HOSPITAL_COMMUNITY): Payer: Medicare Other | Admitting: Certified Registered"

## 2020-03-08 ENCOUNTER — Inpatient Hospital Stay (HOSPITAL_COMMUNITY)
Admission: RE | Disposition: A | Discharge status: Home / Self Care | Payer: Self-pay | Source: Home / Self Care | Attending: Specialist

## 2020-03-08 ENCOUNTER — Inpatient Hospital Stay (HOSPITAL_COMMUNITY): Payer: Medicare Other

## 2020-03-08 ENCOUNTER — Other Ambulatory Visit: Payer: Self-pay

## 2020-03-08 ENCOUNTER — Inpatient Hospital Stay (HOSPITAL_COMMUNITY): Payer: Medicare Other | Admitting: Physician Assistant

## 2020-03-08 DIAGNOSIS — Z20822 Contact with and (suspected) exposure to covid-19: Secondary | ICD-10-CM | POA: Diagnosis present

## 2020-03-08 DIAGNOSIS — M960 Pseudarthrosis after fusion or arthrodesis: Principal | ICD-10-CM

## 2020-03-08 DIAGNOSIS — M40295 Other kyphosis, thoracolumbar region: Secondary | ICD-10-CM

## 2020-03-08 DIAGNOSIS — T8484XA Pain due to internal orthopedic prosthetic devices, implants and grafts, initial encounter: Secondary | ICD-10-CM | POA: Diagnosis not present

## 2020-03-08 DIAGNOSIS — M5134 Other intervertebral disc degeneration, thoracic region: Secondary | ICD-10-CM

## 2020-03-08 DIAGNOSIS — Z9889 Other specified postprocedural states: Secondary | ICD-10-CM | POA: Diagnosis not present

## 2020-03-08 DIAGNOSIS — Z79890 Hormone replacement therapy (postmenopausal): Secondary | ICD-10-CM | POA: Diagnosis not present

## 2020-03-08 DIAGNOSIS — Z90710 Acquired absence of both cervix and uterus: Secondary | ICD-10-CM

## 2020-03-08 DIAGNOSIS — M4326 Fusion of spine, lumbar region: Secondary | ICD-10-CM | POA: Diagnosis not present

## 2020-03-08 DIAGNOSIS — Z888 Allergy status to other drugs, medicaments and biological substances status: Secondary | ICD-10-CM | POA: Diagnosis not present

## 2020-03-08 DIAGNOSIS — Z9049 Acquired absence of other specified parts of digestive tract: Secondary | ICD-10-CM | POA: Diagnosis not present

## 2020-03-08 DIAGNOSIS — M4310 Spondylolisthesis, site unspecified: Secondary | ICD-10-CM | POA: Diagnosis not present

## 2020-03-08 DIAGNOSIS — M1990 Unspecified osteoarthritis, unspecified site: Secondary | ICD-10-CM | POA: Diagnosis present

## 2020-03-08 DIAGNOSIS — Z882 Allergy status to sulfonamides status: Secondary | ICD-10-CM

## 2020-03-08 DIAGNOSIS — G9741 Accidental puncture or laceration of dura during a procedure: Secondary | ICD-10-CM | POA: Diagnosis not present

## 2020-03-08 DIAGNOSIS — M4005 Postural kyphosis, thoracolumbar region: Secondary | ICD-10-CM

## 2020-03-08 DIAGNOSIS — Z85828 Personal history of other malignant neoplasm of skin: Secondary | ICD-10-CM

## 2020-03-08 DIAGNOSIS — Z881 Allergy status to other antibiotic agents status: Secondary | ICD-10-CM

## 2020-03-08 DIAGNOSIS — Y838 Other surgical procedures as the cause of abnormal reaction of the patient, or of later complication, without mention of misadventure at the time of the procedure: Secondary | ICD-10-CM | POA: Diagnosis present

## 2020-03-08 DIAGNOSIS — I10 Essential (primary) hypertension: Secondary | ICD-10-CM | POA: Diagnosis present

## 2020-03-08 DIAGNOSIS — K449 Diaphragmatic hernia without obstruction or gangrene: Secondary | ICD-10-CM | POA: Diagnosis present

## 2020-03-08 DIAGNOSIS — M48062 Spinal stenosis, lumbar region with neurogenic claudication: Secondary | ICD-10-CM | POA: Diagnosis not present

## 2020-03-08 DIAGNOSIS — M47895 Other spondylosis, thoracolumbar region: Secondary | ICD-10-CM | POA: Diagnosis not present

## 2020-03-08 DIAGNOSIS — M4325 Fusion of spine, thoracolumbar region: Secondary | ICD-10-CM | POA: Diagnosis present

## 2020-03-08 DIAGNOSIS — M5116 Intervertebral disc disorders with radiculopathy, lumbar region: Secondary | ICD-10-CM | POA: Diagnosis present

## 2020-03-08 DIAGNOSIS — Z981 Arthrodesis status: Secondary | ICD-10-CM

## 2020-03-08 DIAGNOSIS — R1110 Vomiting, unspecified: Secondary | ICD-10-CM

## 2020-03-08 DIAGNOSIS — K219 Gastro-esophageal reflux disease without esophagitis: Secondary | ICD-10-CM | POA: Diagnosis present

## 2020-03-08 DIAGNOSIS — Z79899 Other long term (current) drug therapy: Secondary | ICD-10-CM

## 2020-03-08 DIAGNOSIS — Z4542 Encounter for adjustment and management of neuropacemaker (brain) (peripheral nerve) (spinal cord): Secondary | ICD-10-CM | POA: Diagnosis not present

## 2020-03-08 DIAGNOSIS — M4316 Spondylolisthesis, lumbar region: Secondary | ICD-10-CM | POA: Diagnosis present

## 2020-03-08 DIAGNOSIS — Z419 Encounter for procedure for purposes other than remedying health state, unspecified: Secondary | ICD-10-CM

## 2020-03-08 DIAGNOSIS — Z885 Allergy status to narcotic agent status: Secondary | ICD-10-CM

## 2020-03-08 DIAGNOSIS — G4730 Sleep apnea, unspecified: Secondary | ICD-10-CM | POA: Diagnosis present

## 2020-03-08 DIAGNOSIS — M797 Fibromyalgia: Secondary | ICD-10-CM | POA: Diagnosis present

## 2020-03-08 DIAGNOSIS — Z9682 Presence of neurostimulator: Secondary | ICD-10-CM | POA: Diagnosis not present

## 2020-03-08 DIAGNOSIS — T85840A Pain due to nervous system prosthetic devices, implants and grafts, initial encounter: Secondary | ICD-10-CM | POA: Diagnosis present

## 2020-03-08 DIAGNOSIS — M5135 Other intervertebral disc degeneration, thoracolumbar region: Secondary | ICD-10-CM | POA: Diagnosis not present

## 2020-03-08 DIAGNOSIS — M4306 Spondylolysis, lumbar region: Secondary | ICD-10-CM | POA: Diagnosis present

## 2020-03-08 DIAGNOSIS — M40205 Unspecified kyphosis, thoracolumbar region: Secondary | ICD-10-CM | POA: Diagnosis not present

## 2020-03-08 DIAGNOSIS — R112 Nausea with vomiting, unspecified: Secondary | ICD-10-CM | POA: Diagnosis not present

## 2020-03-08 LAB — POCT I-STAT 7, (LYTES, BLD GAS, ICA,H+H)
Acid-base deficit: 8 mmol/L — ABNORMAL HIGH (ref 0.0–2.0)
Bicarbonate: 18.4 mmol/L — ABNORMAL LOW (ref 20.0–28.0)
Calcium, Ion: 0.99 mmol/L — ABNORMAL LOW (ref 1.15–1.40)
HCT: 26 % — ABNORMAL LOW (ref 36.0–46.0)
Hemoglobin: 8.8 g/dL — ABNORMAL LOW (ref 12.0–15.0)
O2 Saturation: 100 %
Patient temperature: 37.4
Potassium: 3.7 mmol/L (ref 3.5–5.1)
Sodium: 139 mmol/L (ref 135–145)
TCO2: 20 mmol/L — ABNORMAL LOW (ref 22–32)
pCO2 arterial: 41.5 mmHg (ref 32.0–48.0)
pH, Arterial: 7.257 — ABNORMAL LOW (ref 7.350–7.450)
pO2, Arterial: 251 mmHg — ABNORMAL HIGH (ref 83.0–108.0)

## 2020-03-08 LAB — SARS CORONAVIRUS 2 (TAT 6-24 HRS): SARS Coronavirus 2: NEGATIVE

## 2020-03-08 SURGERY — POSTERIOR LUMBAR FUSION 3 WITH HARDWARE REMOVAL
Anesthesia: General

## 2020-03-08 MED ORDER — DOCUSATE SODIUM 100 MG PO CAPS
100.0000 mg | ORAL_CAPSULE | Freq: Two times a day (BID) | ORAL | Status: DC
  Administered 2020-03-09 – 2020-03-11 (×4): 100 mg via ORAL
  Filled 2020-03-08 (×6): qty 1

## 2020-03-08 MED ORDER — PROBIOTIC DAILY PO CAPS: ORAL_CAPSULE | Freq: Every day | ORAL | Status: DC

## 2020-03-08 MED ORDER — ALBUMIN HUMAN 5 % IV SOLN: INTRAVENOUS | Status: DC | PRN

## 2020-03-08 MED ORDER — BUPIVACAINE LIPOSOME 1.3 % IJ SUSP
10.0000 mL | Freq: Once | INTRAMUSCULAR | Status: DC
  Filled 2020-03-08: qty 10

## 2020-03-08 MED ORDER — OXYCODONE HCL 5 MG PO TABS: 10.0000 mg | ORAL_TABLET | ORAL | Status: DC | PRN

## 2020-03-08 MED ORDER — BUPIVACAINE HCL 0.5 % IJ SOLN
INTRAMUSCULAR | Status: DC | PRN
  Administered 2020-03-08 (×2): 20 mL

## 2020-03-08 MED ORDER — SUFENTANIL CITRATE 50 MCG/ML IV SOLN
INTRAVENOUS | Status: AC
  Filled 2020-03-08: qty 2

## 2020-03-08 MED ORDER — OXYCODONE HCL ER 10 MG PO T12A
10.0000 mg | EXTENDED_RELEASE_TABLET | Freq: Two times a day (BID) | ORAL | Status: DC
Start: 2020-03-08 — End: 2020-03-09
  Administered 2020-03-08: 22:00:00 10 mg via ORAL
  Filled 2020-03-08: qty 1

## 2020-03-08 MED ORDER — FENTANYL CITRATE (PF) 250 MCG/5ML IJ SOLN
INTRAMUSCULAR | Status: AC
  Filled 2020-03-08: qty 5

## 2020-03-08 MED ORDER — LACTATED RINGERS IV SOLN: INTRAVENOUS | Status: DC

## 2020-03-08 MED ORDER — POLYETHYLENE GLYCOL 3350 17 G PO PACK: 17.0000 g | PACK | Freq: Every day | ORAL | Status: DC | PRN

## 2020-03-08 MED ORDER — KETAMINE HCL 50 MG/5ML IJ SOSY
PREFILLED_SYRINGE | INTRAMUSCULAR | Status: AC
  Filled 2020-03-08: qty 10

## 2020-03-08 MED ORDER — VANCOMYCIN HCL 1000 MG IV SOLR
INTRAVENOUS | Status: AC
  Filled 2020-03-08: qty 1000

## 2020-03-08 MED ORDER — CEFAZOLIN SODIUM-DEXTROSE 2-4 GM/100ML-% IV SOLN
2.0000 g | INTRAVENOUS | Status: DC
  Filled 2020-03-08: qty 100

## 2020-03-08 MED ORDER — ONDANSETRON HCL 4 MG/2ML IJ SOLN
INTRAMUSCULAR | Status: DC | PRN
  Administered 2020-03-08: 4 mg via INTRAVENOUS

## 2020-03-08 MED ORDER — LIDOCAINE HCL (CARDIAC) PF 100 MG/5ML IV SOSY
PREFILLED_SYRINGE | INTRAVENOUS | Status: DC | PRN
  Administered 2020-03-08: 40 mg via INTRAVENOUS

## 2020-03-08 MED ORDER — PANTOPRAZOLE SODIUM 40 MG PO TBEC: 40.0000 mg | DELAYED_RELEASE_TABLET | Freq: Every day | ORAL | Status: DC

## 2020-03-08 MED ORDER — PROPOFOL 1000 MG/100ML IV EMUL
INTRAVENOUS | Status: AC
  Filled 2020-03-08: qty 100

## 2020-03-08 MED ORDER — FLEET ENEMA 7-19 GM/118ML RE ENEM: 1.0000 | ENEMA | Freq: Once | RECTAL | Status: DC | PRN

## 2020-03-08 MED ORDER — SODIUM CHLORIDE 0.9 % IV SOLN: INTRAVENOUS | Status: DC

## 2020-03-08 MED ORDER — LACTATED RINGERS IV SOLN: INTRAVENOUS | Status: DC | PRN

## 2020-03-08 MED ORDER — SODIUM CHLORIDE 0.9% FLUSH
3.0000 mL | Freq: Two times a day (BID) | INTRAVENOUS | Status: DC
  Administered 2020-03-08 – 2020-03-11 (×2): 3 mL via INTRAVENOUS

## 2020-03-08 MED ORDER — FENTANYL CITRATE (PF) 100 MCG/2ML IJ SOLN
INTRAMUSCULAR | Status: DC | PRN
  Administered 2020-03-08 (×2): 100 ug via INTRAVENOUS
  Administered 2020-03-08: 50 ug via INTRAVENOUS

## 2020-03-08 MED ORDER — FENTANYL CITRATE (PF) 100 MCG/2ML IJ SOLN
INTRAMUSCULAR | Status: AC
  Administered 2020-03-08: 17:00:00 50 ug via INTRAVENOUS
  Filled 2020-03-08: qty 2

## 2020-03-08 MED ORDER — METHOCARBAMOL 500 MG PO TABS: 500.0000 mg | ORAL_TABLET | Freq: Four times a day (QID) | ORAL | Status: DC | PRN

## 2020-03-08 MED ORDER — BISACODYL 5 MG PO TBEC
5.0000 mg | DELAYED_RELEASE_TABLET | Freq: Every day | ORAL | Status: DC | PRN
  Administered 2020-03-09: 21:00:00 5 mg via ORAL
  Filled 2020-03-08: qty 1

## 2020-03-08 MED ORDER — SUCCINYLCHOLINE CHLORIDE 200 MG/10ML IV SOSY
PREFILLED_SYRINGE | INTRAVENOUS | Status: AC
  Filled 2020-03-08: qty 10

## 2020-03-08 MED ORDER — ORAL CARE MOUTH RINSE: 15.0000 mL | Freq: Once | OROMUCOSAL | Status: AC

## 2020-03-08 MED ORDER — ACETAMINOPHEN 325 MG PO TABS
650.0000 mg | ORAL_TABLET | ORAL | Status: DC | PRN
  Administered 2020-03-08 – 2020-03-11 (×7): 650 mg via ORAL
  Filled 2020-03-08 (×7): qty 2

## 2020-03-08 MED ORDER — EPHEDRINE 5 MG/ML INJ
INTRAVENOUS | Status: AC
  Filled 2020-03-08: qty 10

## 2020-03-08 MED ORDER — BUPIVACAINE LIPOSOME 1.3 % IJ SUSP
INTRAMUSCULAR | Status: DC | PRN
  Administered 2020-03-08: 20 mL

## 2020-03-08 MED ORDER — EPINEPHRINE 0.3 MG/0.3ML IJ SOAJ
0.3000 mg | INTRAMUSCULAR | Status: DC | PRN
  Filled 2020-03-08: qty 0.6

## 2020-03-08 MED ORDER — ONDANSETRON HCL 4 MG/2ML IJ SOLN
INTRAMUSCULAR | Status: AC
  Administered 2020-03-08: 17:00:00 4 mg via INTRAVENOUS
  Filled 2020-03-08: qty 2

## 2020-03-08 MED ORDER — SUCCINYLCHOLINE CHLORIDE 200 MG/10ML IV SOSY
PREFILLED_SYRINGE | INTRAVENOUS | Status: DC | PRN
  Administered 2020-03-08: 140 mg via INTRAVENOUS

## 2020-03-08 MED ORDER — THROMBIN (RECOMBINANT) 20000 UNITS EX SOLR
CUTANEOUS | Status: AC
  Filled 2020-03-08: qty 20000

## 2020-03-08 MED ORDER — ALUM & MAG HYDROXIDE-SIMETH 200-200-20 MG/5ML PO SUSP
30.0000 mL | Freq: Four times a day (QID) | ORAL | Status: DC | PRN
Start: 2020-03-08 — End: 2020-03-11
  Administered 2020-03-09 – 2020-03-10 (×2): 30 mL via ORAL
  Filled 2020-03-08 (×2): qty 30

## 2020-03-08 MED ORDER — DEXAMETHASONE SODIUM PHOSPHATE 10 MG/ML IJ SOLN
INTRAMUSCULAR | Status: DC | PRN
  Administered 2020-03-08: 10 mg via INTRAVENOUS

## 2020-03-08 MED ORDER — CEFAZOLIN SODIUM-DEXTROSE 2-3 GM-%(50ML) IV SOLR
INTRAVENOUS | Status: DC | PRN
  Administered 2020-03-08 (×2): 2 g via INTRAVENOUS

## 2020-03-08 MED ORDER — BUPIVACAINE HCL (PF) 0.5 % IJ SOLN
INTRAMUSCULAR | Status: AC
  Filled 2020-03-08: qty 30

## 2020-03-08 MED ORDER — MAGNESIUM OXIDE 400 (241.3 MG) MG PO TABS
200.0000 mg | ORAL_TABLET | Freq: Every day | ORAL | Status: DC
  Administered 2020-03-08 – 2020-03-11 (×4): 200 mg via ORAL
  Filled 2020-03-08 (×4): qty 1

## 2020-03-08 MED ORDER — PHENYLEPHRINE 40 MCG/ML (10ML) SYRINGE FOR IV PUSH (FOR BLOOD PRESSURE SUPPORT)
PREFILLED_SYRINGE | INTRAVENOUS | Status: AC
  Filled 2020-03-08: qty 10

## 2020-03-08 MED ORDER — FENTANYL CITRATE (PF) 100 MCG/2ML IJ SOLN
25.0000 ug | INTRAMUSCULAR | Status: DC | PRN
  Administered 2020-03-08: 50 ug via INTRAVENOUS

## 2020-03-08 MED ORDER — KETAMINE HCL 10 MG/ML IJ SOLN
INTRAMUSCULAR | Status: DC | PRN
  Administered 2020-03-08 (×2): 10 mg via INTRAVENOUS
  Administered 2020-03-08: 30 mg via INTRAVENOUS
  Administered 2020-03-08 (×4): 10 mg via INTRAVENOUS

## 2020-03-08 MED ORDER — LORATADINE 10 MG PO TABS
10.0000 mg | ORAL_TABLET | Freq: Every day | ORAL | Status: DC
  Administered 2020-03-08 – 2020-03-11 (×4): 10 mg via ORAL
  Filled 2020-03-08 (×4): qty 1

## 2020-03-08 MED ORDER — PANTOPRAZOLE SODIUM 40 MG IV SOLR
40.0000 mg | Freq: Every day | INTRAVENOUS | Status: DC
  Administered 2020-03-08: 22:00:00 40 mg via INTRAVENOUS
  Filled 2020-03-08: qty 40

## 2020-03-08 MED ORDER — DEXMEDETOMIDINE (PRECEDEX) IN NS 20 MCG/5ML (4 MCG/ML) IV SYRINGE
PREFILLED_SYRINGE | INTRAVENOUS | Status: DC | PRN
  Administered 2020-03-08 (×2): 8 ug via INTRAVENOUS

## 2020-03-08 MED ORDER — METHOCARBAMOL 1000 MG/10ML IJ SOLN
500.0000 mg | Freq: Four times a day (QID) | INTRAVENOUS | Status: DC | PRN
  Filled 2020-03-08: qty 5

## 2020-03-08 MED ORDER — EPHEDRINE SULFATE-NACL 50-0.9 MG/10ML-% IV SOSY
PREFILLED_SYRINGE | INTRAVENOUS | Status: DC | PRN
  Administered 2020-03-08 (×2): 15 mg via INTRAVENOUS
  Administered 2020-03-08 (×2): 10 mg via INTRAVENOUS

## 2020-03-08 MED ORDER — ONDANSETRON HCL 4 MG/2ML IJ SOLN
INTRAMUSCULAR | Status: AC
  Filled 2020-03-08: qty 2

## 2020-03-08 MED ORDER — PHENYLEPHRINE HCL-NACL 10-0.9 MG/250ML-% IV SOLN
INTRAVENOUS | Status: DC | PRN
  Administered 2020-03-08: 20 ug/min via INTRAVENOUS

## 2020-03-08 MED ORDER — ONDANSETRON HCL 4 MG/2ML IJ SOLN
4.0000 mg | Freq: Four times a day (QID) | INTRAMUSCULAR | Status: DC | PRN
  Administered 2020-03-08 – 2020-03-09 (×4): 4 mg via INTRAVENOUS
  Filled 2020-03-08 (×4): qty 2

## 2020-03-08 MED ORDER — ACETAMINOPHEN 650 MG RE SUPP: 650.0000 mg | RECTAL | Status: DC | PRN

## 2020-03-08 MED ORDER — PANTOPRAZOLE SODIUM 40 MG PO TBEC: 40.0000 mg | DELAYED_RELEASE_TABLET | Freq: Two times a day (BID) | ORAL | Status: DC

## 2020-03-08 MED ORDER — MIDAZOLAM HCL 5 MG/5ML IJ SOLN
INTRAMUSCULAR | Status: DC | PRN
  Administered 2020-03-08: 2 mg via INTRAVENOUS

## 2020-03-08 MED ORDER — ONDANSETRON HCL 4 MG PO TABS: 4.0000 mg | ORAL_TABLET | Freq: Four times a day (QID) | ORAL | Status: DC | PRN

## 2020-03-08 MED ORDER — METOPROLOL SUCCINATE ER 25 MG PO TB24
25.0000 mg | ORAL_TABLET | Freq: Every day | ORAL | Status: DC
  Administered 2020-03-09 – 2020-03-11 (×3): 25 mg via ORAL
  Filled 2020-03-08 (×3): qty 1

## 2020-03-08 MED ORDER — HYDROCHLOROTHIAZIDE 10 MG/ML ORAL SUSPENSION
6.2500 mg | Freq: Every day | ORAL | Status: DC
Start: 2020-03-09 — End: 2020-03-11
  Administered 2020-03-09 – 2020-03-11 (×3): 6.25 mg via ORAL
  Filled 2020-03-08 (×4): qty 1.25

## 2020-03-08 MED ORDER — OXYCODONE HCL 5 MG PO TABS: 5.0000 mg | ORAL_TABLET | ORAL | Status: DC | PRN

## 2020-03-08 MED ORDER — HYDROMORPHONE HCL 1 MG/ML IJ SOLN: 0.5000 mg | INTRAMUSCULAR | Status: DC | PRN

## 2020-03-08 MED ORDER — ONDANSETRON HCL 4 MG/2ML IJ SOLN: 4.0000 mg | Freq: Once | INTRAMUSCULAR | Status: AC | PRN

## 2020-03-08 MED ORDER — ACETAMINOPHEN 10 MG/ML IV SOLN
INTRAVENOUS | Status: DC | PRN
  Administered 2020-03-08: 1000 mg via INTRAVENOUS

## 2020-03-08 MED ORDER — THROMBIN 20000 UNITS EX SOLR
CUTANEOUS | Status: DC | PRN
  Administered 2020-03-08: 09:00:00 20 mL via TOPICAL

## 2020-03-08 MED ORDER — PROPOFOL 10 MG/ML IV BOLUS
INTRAVENOUS | Status: DC | PRN
  Administered 2020-03-08: 40 ug/kg/min via INTRAVENOUS
  Administered 2020-03-08: 20 ug via INTRAVENOUS
  Administered 2020-03-08: 150 ug via INTRAVENOUS
  Administered 2020-03-08: 50 ug/kg/min via INTRAVENOUS

## 2020-03-08 MED ORDER — BUPIVACAINE LIPOSOME 1.3 % IJ SUSP
20.0000 mL | Freq: Once | INTRAMUSCULAR | Status: DC
  Filled 2020-03-08: qty 20

## 2020-03-08 MED ORDER — PHENYLEPHRINE 40 MCG/ML (10ML) SYRINGE FOR IV PUSH (FOR BLOOD PRESSURE SUPPORT)
PREFILLED_SYRINGE | INTRAVENOUS | Status: DC | PRN
  Administered 2020-03-08: 80 ug via INTRAVENOUS
  Administered 2020-03-08: 200 ug via INTRAVENOUS
  Administered 2020-03-08: 120 ug via INTRAVENOUS
  Administered 2020-03-08 (×7): 80 ug via INTRAVENOUS
  Administered 2020-03-08: 120 ug via INTRAVENOUS

## 2020-03-08 MED ORDER — ESTRADIOL 1 MG PO TABS
1.0000 mg | ORAL_TABLET | Freq: Every day | ORAL | Status: DC
  Administered 2020-03-09 – 2020-03-11 (×3): 1 mg via ORAL
  Filled 2020-03-08 (×3): qty 1

## 2020-03-08 MED ORDER — SUFENTANIL CITRATE 50 MCG/ML IV SOLN
INTRAVENOUS | Status: DC | PRN
  Administered 2020-03-08: 2.5 ug via INTRAVENOUS
  Administered 2020-03-08: 5 ug via INTRAVENOUS
  Administered 2020-03-08 (×3): 2.5 ug via INTRAVENOUS
  Administered 2020-03-08 (×3): 5 ug via INTRAVENOUS
  Administered 2020-03-08: 2.5 ug via INTRAVENOUS
  Administered 2020-03-08: 5 ug via INTRAVENOUS
  Administered 2020-03-08: 2.5 ug via INTRAVENOUS
  Administered 2020-03-08 (×2): 5 ug via INTRAVENOUS

## 2020-03-08 MED ORDER — ACETAMINOPHEN 10 MG/ML IV SOLN
INTRAVENOUS | Status: AC
  Filled 2020-03-08: qty 100

## 2020-03-08 MED ORDER — SODIUM CHLORIDE 0.9% FLUSH: 3.0000 mL | INTRAVENOUS | Status: DC | PRN

## 2020-03-08 MED ORDER — MENTHOL 3 MG MT LOZG: 1.0000 | LOZENGE | OROMUCOSAL | Status: DC | PRN

## 2020-03-08 MED ORDER — CLONAZEPAM 0.5 MG PO TABS
0.5000 mg | ORAL_TABLET | Freq: Every day | ORAL | Status: DC
  Administered 2020-03-08 – 2020-03-10 (×3): 0.5 mg via ORAL
  Filled 2020-03-08 (×3): qty 1

## 2020-03-08 MED ORDER — METOPROLOL-HCTZ ER 50-12.5 MG PO TB24: 0.5000 | ORAL_TABLET | Freq: Every day | ORAL | Status: DC

## 2020-03-08 MED ORDER — LIDOCAINE 2% (20 MG/ML) 5 ML SYRINGE
INTRAMUSCULAR | Status: AC
  Filled 2020-03-08: qty 5

## 2020-03-08 MED ORDER — RISAQUAD PO CAPS
1.0000 | ORAL_CAPSULE | Freq: Every day | ORAL | Status: DC
  Administered 2020-03-09 – 2020-03-11 (×3): 1 via ORAL
  Filled 2020-03-08 (×3): qty 1

## 2020-03-08 MED ORDER — ALBUTEROL SULFATE (2.5 MG/3ML) 0.083% IN NEBU: 3.0000 mL | INHALATION_SOLUTION | Freq: Four times a day (QID) | RESPIRATORY_TRACT | Status: DC | PRN

## 2020-03-08 MED ORDER — DEXAMETHASONE SODIUM PHOSPHATE 10 MG/ML IJ SOLN
INTRAMUSCULAR | Status: AC
  Filled 2020-03-08: qty 1

## 2020-03-08 MED ORDER — CEFAZOLIN SODIUM-DEXTROSE 2-4 GM/100ML-% IV SOLN
2.0000 g | Freq: Three times a day (TID) | INTRAVENOUS | Status: AC
  Administered 2020-03-08 – 2020-03-09 (×2): 2 g via INTRAVENOUS
  Filled 2020-03-08 (×2): qty 100

## 2020-03-08 MED ORDER — CHLORHEXIDINE GLUCONATE 0.12 % MT SOLN
15.0000 mL | Freq: Once | OROMUCOSAL | Status: AC
  Administered 2020-03-08: 07:00:00 15 mL via OROMUCOSAL
  Filled 2020-03-08: qty 15

## 2020-03-08 MED ORDER — PHENOL 1.4 % MT LIQD: 1.0000 | OROMUCOSAL | Status: DC | PRN

## 2020-03-08 MED ORDER — 0.9 % SODIUM CHLORIDE (POUR BTL) OPTIME
TOPICAL | Status: DC | PRN
  Administered 2020-03-08 (×2): 1000 mL

## 2020-03-08 MED ORDER — SODIUM CHLORIDE 0.9 % IV SOLN: 250.0000 mL | INTRAVENOUS | Status: DC

## 2020-03-08 MED ORDER — MIDAZOLAM HCL 2 MG/2ML IJ SOLN
INTRAMUSCULAR | Status: AC
  Filled 2020-03-08: qty 2

## 2020-03-08 SURGICAL SUPPLY — 81 items
480mm x 5.5 T Rod IMPLANT
A1 Crosslink IMPLANT
ADH SKN CLS APL DERMABOND .7 (GAUZE/BANDAGES/DRESSINGS) ×1
BONE CANC CHIPS 40CC CAN1/2 (Bone Implant) ×2 IMPLANT
BONE VIVIGEN FORMABLE 10CC (Bone Implant) ×4 IMPLANT
BUR MATCHSTICK NEURO 3.0 LAGG (BURR) ×2 IMPLANT
BUR RND FLUTED 2.5 (BURR) ×1 IMPLANT
CAGE SABLE 10X22 6-12 8D (Cage) ×3 IMPLANT
CHIPS CANC BONE 40CC CAN1/2 (Bone Implant) ×1 IMPLANT
COVER BACK TABLE 80X110 HD (DRAPES) ×2 IMPLANT
COVER MAYO STAND STRL (DRAPES) ×4 IMPLANT
COVER SURGICAL LIGHT HANDLE (MISCELLANEOUS) ×2 IMPLANT
COVER WAND RF STERILE (DRAPES) ×2 IMPLANT
CROSSLINK EXPEDIUM SFX A1 (Neuro Prosthesis/Implant) ×2 IMPLANT
DERMABOND ADVANCED (GAUZE/BANDAGES/DRESSINGS) ×1
DERMABOND ADVANCED .7 DNX12 (GAUZE/BANDAGES/DRESSINGS) ×1 IMPLANT
DRAPE C-ARM 42X72 X-RAY (DRAPES) ×4 IMPLANT
DRAPE C-ARMOR (DRAPES) ×1 IMPLANT
DRAPE INCISE IOBAN 66X45 STRL (DRAPES) ×2 IMPLANT
DRAPE SURG 17X23 STRL (DRAPES) ×6 IMPLANT
DRSG TEGADERM 4X10 (GAUZE/BANDAGES/DRESSINGS) ×1 IMPLANT
DURAPREP 26ML APPLICATOR (WOUND CARE) ×2 IMPLANT
DURASEAL SPINE SEALANT 3ML (MISCELLANEOUS) ×1 IMPLANT
ELECT BLADE 6.5 EXT (BLADE) ×1 IMPLANT
ELECT CAUTERY BLADE 6.4 (BLADE) ×2 IMPLANT
ELECT REM PT RETURN 9FT ADLT (ELECTROSURGICAL) ×2
ELECTRODE REM PT RTRN 9FT ADLT (ELECTROSURGICAL) ×1 IMPLANT
EVACUATOR 1/8 PVC DRAIN (DRAIN) ×2 IMPLANT
FEE INTRAOP MONITOR IMPULS NCS (MISCELLANEOUS) IMPLANT
GAUZE SPONGE 4X4 12PLY STRL (GAUZE/BANDAGES/DRESSINGS) ×1 IMPLANT
GAUZE XEROFORM 5X9 LF (GAUZE/BANDAGES/DRESSINGS) ×1 IMPLANT
GLOVE BIOGEL PI IND STRL 8 (GLOVE) ×1 IMPLANT
GLOVE BIOGEL PI INDICATOR 8 (GLOVE) ×1
GLOVE ECLIPSE 9.0 STRL (GLOVE) ×2 IMPLANT
GLOVE ORTHO TXT STRL SZ7.5 (GLOVE) ×2 IMPLANT
GLOVE SURG 8.5 LATEX PF (GLOVE) ×2 IMPLANT
GOWN STRL REUS W/ TWL LRG LVL3 (GOWN DISPOSABLE) ×1 IMPLANT
GOWN STRL REUS W/TWL 2XL LVL3 (GOWN DISPOSABLE) ×4 IMPLANT
GOWN STRL REUS W/TWL LRG LVL3 (GOWN DISPOSABLE) ×2
GRAFT BNE CHIP CANC 1-8 40 (Bone Implant) IMPLANT
GRAFT BNE MATRIX VG FRMBL L 10 (Bone Implant) IMPLANT
GRAFT DURAGEN MATRIX 1WX1L (Tissue) ×2 IMPLANT
HEMOSTAT SURGICEL 2X14 (HEMOSTASIS) ×1 IMPLANT
INTRAOP MONITOR FEE IMPULS NCS (MISCELLANEOUS) ×1
INTRAOP MONITOR FEE IMPULSE (MISCELLANEOUS) ×2
KIT BASIN OR (CUSTOM PROCEDURE TRAY) ×2 IMPLANT
KIT POSITION SURG JACKSON T1 (MISCELLANEOUS) ×2 IMPLANT
KIT TURNOVER KIT B (KITS) ×2 IMPLANT
NEEDLE 22X1 1/2 (OR ONLY) (NEEDLE) ×2 IMPLANT
NS IRRIG 1000ML POUR BTL (IV SOLUTION) ×4 IMPLANT
PACK LAMINECTOMY ORTHO (CUSTOM PROCEDURE TRAY) ×2 IMPLANT
PAD ARMBOARD 7.5X6 YLW CONV (MISCELLANEOUS) ×4 IMPLANT
PATTIES SURGICAL .75X.75 (GAUZE/BANDAGES/DRESSINGS) ×2 IMPLANT
PROBE PED SCREW MONOP 3 BT NCS (MISCELLANEOUS) IMPLANT
ROD EXPEDIUM 480MM (Rod) ×1 IMPLANT
SCREW CORT FIX FEN 5.5X5X35MM (Screw) ×2 IMPLANT
SCREW CORT FIX FEN 5.5X6X40MM (Screw) ×4 IMPLANT
SCREW PROBE PEDICLE MONOPOLAR (MISCELLANEOUS) ×2
SCREW SET SINGLE INNER (Screw) ×8 IMPLANT
SCREW VIPER CORT FIX 4.35X35 (Screw) ×4 IMPLANT
SCREW VIPER CORT FIX 4.35X40 (Screw) ×2 IMPLANT
SPACER TI SUSTAIN-RT 8X22X6 0D (Spacer) ×1 IMPLANT
SPONGE LAP 4X18 RFD (DISPOSABLE) ×2 IMPLANT
SPONGE SURGIFOAM ABS GEL 100 (HEMOSTASIS) ×2 IMPLANT
SUT NURALON 4 0 TR CR/8 (SUTURE) ×1 IMPLANT
SUT VIC AB 0 CT1 27 (SUTURE) ×6
SUT VIC AB 0 CT1 27XBRD ANBCTR (SUTURE) ×1 IMPLANT
SUT VIC AB 1 CTX 36 (SUTURE) ×6
SUT VIC AB 1 CTX36XBRD ANBCTR (SUTURE) ×1 IMPLANT
SUT VIC AB 2-0 CT1 27 (SUTURE) ×2
SUT VIC AB 2-0 CT1 TAPERPNT 27 (SUTURE) ×1 IMPLANT
SUT VIC AB 3-0 X1 27 (SUTURE) ×2 IMPLANT
SYR 20ML LL LF (SYRINGE) ×2 IMPLANT
SYR CONTROL 10ML LL (SYRINGE) ×4 IMPLANT
TAP EXPEDIUM DL 5.0 (INSTRUMENTS) ×1 IMPLANT
TAP VIPER MIS 4.35MM (TAP) ×1 IMPLANT
TOWEL GREEN STERILE (TOWEL DISPOSABLE) ×6 IMPLANT
TOWEL GREEN STERILE FF (TOWEL DISPOSABLE) ×2 IMPLANT
TRAY FOLEY MTR SLVR 16FR STAT (SET/KITS/TRAYS/PACK) ×2 IMPLANT
WATER STERILE IRR 1000ML POUR (IV SOLUTION) ×2 IMPLANT
YANKAUER SUCT BULB TIP NO VENT (SUCTIONS) ×2 IMPLANT

## 2020-03-08 NOTE — OR Nursing (Signed)
Pt in room, family at bedside, pt in NAD at this time.

## 2020-03-08 NOTE — Anesthesia Procedure Notes (Signed)
Arterial Line Insertion Start/End12/14/2021 7:20 AM, 03/08/2020 7:25 AM Performed by: Roberts Gaudy, MD, Lance Coon, CRNA, CRNA  Patient location: Pre-op. Preanesthetic checklist: patient identified, IV checked, site marked, risks and benefits discussed, surgical consent, monitors and equipment checked, pre-op evaluation, timeout performed and anesthesia consent Lidocaine 1% used for infiltration Left, radial was placed Catheter size: 20 Fr Hand hygiene performed  and maximum sterile barriers used   Attempts: 2 Procedure performed without using ultrasound guided technique. Following insertion, dressing applied and Biopatch. Post procedure assessment: normal and unchanged  Post procedure complications: local hematoma. Patient tolerated the procedure well with no immediate complications. Additional procedure comments: First attempt by S. Sonia Baller. Marland Kitchen

## 2020-03-08 NOTE — Progress Notes (Signed)
Patient arrived on unit via bed. Patient resting comfortably. NAD noted at this time.

## 2020-03-08 NOTE — H&P (Signed)
[name] is an 64 y.o. female.   Chief Complaint: back pain and LE radiculopathy  HPI: patient with hx of retained spinal cord stimulator and lumbar stenosis comes in for preop evaluation.  Symptoms unchanged and has failed conservative treatment.  Wants to proceed with REMOVAL OF SPINAL CORD STIMULATOR AND BATTERY, REMOVAL OF HARDWARE L1 TO L3, TRANSFORAMINAL LUMBAR INTERBODY FUSION LEFT L1-2 AND T12-L1, POSTERIOR FUSION INSTRUMENTATION T10, T11, T12, L1 AND L2, LOCAL BONE GRAFT, ALLOGRAFT BONE GRAFT, VIVIGEN  Past Medical History:  Diagnosis Date  . Arthritis   . Asthma   . Cancer (HCC)    cervical , skin  . Cold    recent  rx  . Fibromyalgia   . GERD (gastroesophageal reflux disease)   . H/O hiatal hernia   . Hypertension   . PONV (postoperative nausea and vomiting)   . Shortness of breath   . Sleep apnea    ' mild " does not use cpap    Past Surgical History:  Procedure Laterality Date  . ABDOMINAL HYSTERECTOMY  04  . APPENDECTOMY    . BACK SURGERY     x3  . CERVICAL CONIZATION W/BX    . CERVICAL DISC SURGERY  06  . CHOLECYSTECTOMY  03  . LUMBAR FUSION  04/07/2013   L 2 L3 L4    with rod     DR NITKA  . TONSILLECTOMY      Family History  Problem Relation Age of Onset  . Allergic rhinitis Neg Hx   . Angioedema Neg Hx   . Asthma Neg Hx   . Atopy Neg Hx   . Eczema Neg Hx   . Immunodeficiency Neg Hx   . Urticaria Neg Hx    Social History:  reports that she has never smoked. She has never used smokeless tobacco. She reports that she does not drink alcohol and does not use drugs.  Allergies:  Allergies  Allergen Reactions  . Cephalosporins Nausea And Vomiting  . Codeine Hives and Palpitations    Hydrocodone, Ultram  . Doxycycline Nausea And Vomiting  . Novocain [Procaine] Swelling  . Other Nausea And Vomiting    Sensitive to pain medications  . Statins Other (See Comments)    myalgia  . Sulfa Antibiotics Hives and Nausea And Vomiting  . Tricyclic  Antidepressants Nausea And Vomiting    Cymbalta and alike meds  . Ace Inhibitors Cough  . Asa [Aspirin] Nausea And Vomiting  . Morphine And Related     High blood pressure   . Ultram [Tramadol] Other (See Comments)    myalgia  . Zetia [Ezetimibe] Other (See Comments)    Muscle Aches    Medications Prior to Admission  Medication Sig Dispense Refill  . albuterol (VENTOLIN HFA) 108 (90 Base) MCG/ACT inhaler Inhale 1 puff into the lungs every 6 (six) hours as needed for wheezing or shortness of breath.    . celecoxib (CELEBREX) 200 MG capsule Take 200 mg by mouth daily.    . cetirizine (ZYRTEC) 10 MG tablet Take 10 mg by mouth daily.    . clonazePAM (KLONOPIN) 0.5 MG tablet Take 0.5 mg by mouth at bedtime.    Marland Kitchen EPINEPHrine 0.3 mg/0.3 mL IJ SOAJ injection Inject 0.3 mg into the muscle as needed for anaphylaxis.     Marland Kitchen estradiol (ESTRACE) 1 MG tablet Take 1 mg by mouth daily.    . fexofenadine (ALLEGRA) 180 MG tablet Take 180 mg by mouth daily.    Marland Kitchen  Glucos-Chond-Hyal Ac-Ca Fructo (MOVE FREE JOINT HEALTH ADVANCE PO) Take 1 tablet by mouth daily.    . Magnesium 250 MG TABS Take 250 mg by mouth daily.    . Metoprolol-Hydrochlorothiazide 50-12.5 MG TB24 Take 0.5 tablets by mouth daily.     Marland Kitchen omeprazole (PRILOSEC) 40 MG capsule Take 40 mg by mouth in the morning and at bedtime.     . Probiotic Product (PROBIOTIC DAILY PO) Take 1 capsule by mouth daily.    Marland Kitchen acetaminophen (TYLENOL) 650 MG CR tablet Take 1,300 mg by mouth every 8 (eight) hours as needed for pain.    . pantoprazole (PROTONIX) 40 MG tablet Take 40 mg by mouth 2 (two) times daily.      Results for orders placed or performed during the hospital encounter of 03/07/20 (from the past 48 hour(s))  SARS CORONAVIRUS 2 (TAT 6-24 HRS) Nasopharyngeal Nasopharyngeal Swab     Status: None   Collection Time: 03/07/20 11:31 AM   Specimen: Nasopharyngeal Swab  Result Value Ref Range   SARS Coronavirus 2 NEGATIVE NEGATIVE    Comment:  (NOTE) SARS-CoV-2 target nucleic acids are NOT DETECTED.  The SARS-CoV-2 RNA is generally detectable in upper and lower respiratory specimens during the acute phase of infection. Negative results do not preclude SARS-CoV-2 infection, do not rule out co-infections with other pathogens, and should not be used as the sole basis for treatment or other patient management decisions. Negative results must be combined with clinical observations, patient history, and epidemiological information. The expected result is Negative.  Fact Sheet for Patients: SugarRoll.be  Fact Sheet for Healthcare Providers: https://www.woods-mathews.com/  This test is not yet approved or cleared by the [place] FDA and  has been authorized for detection and/or diagnosis of SARS-CoV-2 by FDA under an Emergency Use Authorization (EUA). This EUA will remain  in effect (meaning this test can be used) for the duration of the COVID-19 declaration under Se ction 564(b)(1) of the Act, 21 U.S.C. section 360bbb-3(b)(1), unless the authorization is terminated or revoked sooner.  Performed at [facility], [address] [place], [place] [address]    No results found.  Review of Systems  Constitutional: Positive for activity change.  HENT: Negative.   Respiratory: Negative.   Cardiovascular: Negative.   Gastrointestinal: Negative.   Genitourinary: Negative.   Musculoskeletal: Positive for back pain and gait problem.  Neurological: Positive for numbness.    There were no vitals taken for this visit. Physical Exam HENT:     Head: Normocephalic.  Cardiovascular:     Rate and Rhythm: Regular rhythm.  Pulmonary:     Effort: Pulmonary effort is normal. No respiratory distress.     Breath sounds: Normal breath sounds.  Musculoskeletal:        General: Tenderness present.     Cervical back: Normal range of motion.  Skin:    General: Skin is warm and dry.   Neurological:     Mental Status: She is alert and oriented to person, place, and time.      Assessment/Plan Back pain and LE radiculopathy..  Retained hardware spinal cord stimulator.  We will proceed with REMOVAL OF SPINAL CORD STIMULATOR AND BATTERY, REMOVAL OF HARDWARE L1 TO L3, TRANSFORAMINAL LUMBAR INTERBODY FUSION LEFT L1-2 AND T12-L1, POSTERIOR FUSION INSTRUMENTATION T10, T11, T12, L1 AND L2, LOCAL BONE GRAFT, ALLOGRAFT BONE GRAFT, VIVIGEN as scheduled.  All questions answered.   Benjiman Core, PA-C 03/08/2020, 6:28 AM

## 2020-03-08 NOTE — OR Nursing (Addendum)
Pt is awake,alert and oriented.Pt and/or family verbalized understanding of poc and discharge instructions. Reviewed admission and on going care with receiving RN. Pt is in NAD at this time and is ready to be transferred to floor. Will con't to monitor until pt is transferred. Belongings on bed with patient RN made aware pt has to lay flat for 48 hours . Start time 03/08/20 at 1600.  Pt CMS intact, + pedal/radial pulses, art line was removed dressing cdi. Pt does have some bruising to chest area and arms from laying flat during procedure.  Pt has no complaints and will move to the floor at 630PM.  Will con't to monitor patient until transition Pt back CDI no edema or hematoma noted at this time.

## 2020-03-08 NOTE — Discharge Instructions (Signed)

## 2020-03-08 NOTE — Progress Notes (Signed)
Orthopedic Tech Progress Note Patient Details:  [name] 21-Jun-1955 [id number] Called in order for [facility] Patient ID: [name], female   DOB: 03/04/1956, 64 y.o.   MRN: [id number]   Chip Boer 03/08/2020, 7:51 PM

## 2020-03-08 NOTE — OR Nursing (Signed)
Pt CMS intact, Neuro intact, pt follows commands, grips equal bilat

## 2020-03-08 NOTE — Anesthesia Procedure Notes (Signed)
Procedure Name: Intubation Date/Time: 03/08/2020 8:10 AM Performed by: Lance Coon, CRNA Pre-anesthesia Checklist: Patient identified, Emergency Drugs available, Suction available and Patient being monitored Patient Re-evaluated:Patient Re-evaluated prior to induction Oxygen Delivery Method: Circle System Utilized Preoxygenation: Pre-oxygenation with 100% oxygen Induction Type: IV induction Ventilation: Mask ventilation without difficulty Laryngoscope Size: Mac and 3 Grade View: Grade I Tube type: Oral Tube size: 7.5 mm Number of attempts: 1 Airway Equipment and Method: Stylet and Oral airway Placement Confirmation: ETT inserted through vocal cords under direct vision,  positive ETCO2 and breath sounds checked- equal and bilateral Secured at: 22 cm Tube secured with: Tape Dental Injury: Teeth and Oropharynx as per pre-operative assessment

## 2020-03-08 NOTE — Transfer of Care (Signed)
Immediate Anesthesia Transfer of Care Note  Patient: [name]  Procedure(s) Performed: REMOVAL OF SPINAL CORD STIMULATOR AND BATTERY, REMOVAL OF HARDWARE L1 TO L3, TRANSFORAMINAL LUMBAR INTERBODY FUSION LEFT L1-2 AND T12-L1, POSTERIOR FUSION INSTRUMENTATION T10, T11, T12, L1 AND L2, LOCAL BONE GRAFT, ALLOGRAFT BONE GRAFT, VIVIGEN (N/A )  Patient Location: PACU  Anesthesia Type:General  Level of Consciousness: drowsy and patient cooperative  Airway & Oxygen Therapy: Patient Spontanous Breathing and Patient connected to face mask oxygen  Post-op Assessment: Report given to RN and Post -op Vital signs reviewed and stable  Post vital signs: Reviewed and stable  Last Vitals:  Vitals Value Taken Time  BP    Temp    Pulse 103 03/08/20 1635  Resp 27 03/08/20 1635  SpO2 99 % 03/08/20 1635  Vitals shown include unvalidated device data.  Last Pain:  Vitals:   03/08/20 0636  PainSc: 8          Complications: No complications documented.

## 2020-03-08 NOTE — Brief Op Note (Addendum)
03/08/2020  4:29 PM  PATIENT:  [name]  64 y.o. 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 fusion, retained spinal cord stimulator and battery  PROCEDURE:  Procedure(s): REMOVAL OF SPINAL CORD STIMULATOR AND BATTERY, REMOVAL OF HARDWARE L1 TO L3, TRANSFORAMINAL LUMBAR INTERBODY FUSION LEFT L1-2 AND T12-L1, POSTERIOR FUSION INSTRUMENTATION T10, T11, T12, L1 AND L2, LOCAL BONE GRAFT, ALLOGRAFT BONE GRAFT, VIVIGEN (N/A)  SURGEON:  Surgeon(s) and Role:    * Jessy Oto, MD - Primary  PHYSICIAN ASSISTANT: Benjiman Core, PA-C  ANESTHESIA:   local and general  EBL:  450 mL   BLOOD ADMINISTERED:150 CC CELLSAVER  DRAINS: (medium ) Hemovact drain(s) in the left with  Suction Open and Urinary Catheter (Foley)   LOCAL MEDICATIONS USED:  MARCAINE0.5% 1:1 EXPAREL 1.3% Amount:40 ml  SPECIMEN:  Source of Specimen:  swab of disc space L1-2 and Scraping   COMPLICATIONS: 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 Repair with multiple interrupted 4-0 Neurolon sutures, duragen over the right L1 nerve root and duraseal.   DISPOSITION OF SPECIMEN:  N/A  COUNTS:  YES  TOURNIQUET:  * No tourniquets in log *  DICTATION: .Dragon Dictation  PLAN OF CARE: Admit to inpatient   PATIENT DISPOSITION:  PACU - hemodynamically stable.   Delay start of Pharmacological VTE agent (>24hrs) due to surgical blood loss or risk of bleeding: yes

## 2020-03-09 ENCOUNTER — Inpatient Hospital Stay (HOSPITAL_COMMUNITY): Payer: Medicare Other

## 2020-03-09 DIAGNOSIS — M4306 Spondylolysis, lumbar region: Secondary | ICD-10-CM | POA: Diagnosis present

## 2020-03-09 DIAGNOSIS — G9741 Accidental puncture or laceration of dura during a procedure: Secondary | ICD-10-CM | POA: Diagnosis not present

## 2020-03-09 LAB — BASIC METABOLIC PANEL
Anion gap: 9 (ref 5–15)
BUN: 7 mg/dL — ABNORMAL LOW (ref 8–23)
CO2: 24 mmol/L (ref 22–32)
Calcium: 7.4 mg/dL — ABNORMAL LOW (ref 8.9–10.3)
Chloride: 101 mmol/L (ref 98–111)
Creatinine, Ser: 0.72 mg/dL (ref 0.44–1.00)
GFR, Estimated: >60 mL/min (ref >60)
Glucose, Bld: 133 mg/dL — ABNORMAL HIGH (ref 70–99)
Potassium: 3.8 mmol/L (ref 3.5–5.1)
Sodium: 134 mmol/L — ABNORMAL LOW (ref 135–145)

## 2020-03-09 LAB — CBC
HCT: 30.1 % — ABNORMAL LOW (ref 36.0–46.0)
Hemoglobin: 10.7 g/dL — ABNORMAL LOW (ref 12.0–15.0)
MCH: 31.9 pg (ref 26.0–34.0)
MCHC: 35.5 g/dL (ref 30.0–36.0)
MCV: 89.9 fL (ref 80.0–100.0)
Platelets: 264 10*3/uL (ref 150–400)
RBC: 3.35 MIL/uL — ABNORMAL LOW (ref 3.87–5.11)
RDW: 12.5 % (ref 11.5–15.5)
WBC: 12.7 10*3/uL — ABNORMAL HIGH (ref 4.0–10.5)
nRBC: 0 % (ref 0.0–0.2)

## 2020-03-09 MED ORDER — METOCLOPRAMIDE HCL 5 MG/ML IJ SOLN
5.0000 mg | Freq: Four times a day (QID) | INTRAMUSCULAR | Status: DC
  Administered 2020-03-09 – 2020-03-11 (×8): 5 mg via INTRAVENOUS
  Filled 2020-03-09 (×8): qty 2

## 2020-03-09 MED ORDER — DIAZEPAM 5 MG/ML IJ SOLN
2.5000 mg | Freq: Four times a day (QID) | INTRAMUSCULAR | Status: DC | PRN
  Administered 2020-03-09 (×2): 2.5 mg via INTRAVENOUS
  Filled 2020-03-09 (×2): qty 2

## 2020-03-09 MED ORDER — SODIUM CHLORIDE 0.9 % IV SOLN
80.0000 mg | Freq: Every day | INTRAVENOUS | Status: DC
  Administered 2020-03-09 – 2020-03-10 (×2): 80 mg via INTRAVENOUS
  Filled 2020-03-09 (×4): qty 80

## 2020-03-09 MED ORDER — CHLORHEXIDINE GLUCONATE CLOTH 2 % EX PADS
6.0000 | MEDICATED_PAD | Freq: Every day | CUTANEOUS | Status: DC
  Administered 2020-03-09 – 2020-03-10 (×2): 6 via TOPICAL

## 2020-03-09 MED ORDER — SIMETHICONE 80 MG PO CHEW
80.0000 mg | CHEWABLE_TABLET | Freq: Four times a day (QID) | ORAL | Status: DC | PRN
  Administered 2020-03-09 – 2020-03-10 (×2): 80 mg via ORAL
  Filled 2020-03-09 (×2): qty 1

## 2020-03-09 MED FILL — Sodium Chloride IV Soln 0.9%: INTRAVENOUS | Qty: 2000 | Status: AC

## 2020-03-09 MED FILL — Thrombin (Recombinant) For Soln 20000 Unit: CUTANEOUS | Qty: 1 | Status: AC

## 2020-03-09 MED FILL — Heparin Sodium (Porcine) Inj 1000 Unit/ML: INTRAMUSCULAR | Qty: 60 | Status: AC

## 2020-03-09 NOTE — Progress Notes (Signed)
PT Cancellation Note  Patient Details Name: [name] MRN: [id number] DOB: June 23, 1955   Cancelled Treatment:    Reason Eval/Treat Not Completed: Medical issues which prohibited therapy;Patient not medically ready Per chart review and secure chat with MD, pt to lay flat for 48 hours after surgery. "Start time 03/08/20 at 1600." Will follow-up for PT eval on 12/16 after 1600.   Perrin Maltese, PT, DPT Acute Rehabilitation Services Pager [phone] Office [phone]   Alda Lea 03/09/2020, 7:36 AM

## 2020-03-09 NOTE — Progress Notes (Signed)
     Subjective: 1 Day Post-Op Procedure(s) (LRB): REMOVAL OF SPINAL CORD STIMULATOR AND BATTERY, REMOVAL OF HARDWARE L1 TO L3, TRANSFORAMINAL LUMBAR INTERBODY FUSION LEFT L1-2 AND T12-L1, POSTERIOR FUSION INSTRUMENTATION T10, T11, T12, L1 AND L2, LOCAL BONE GRAFT, ALLOGRAFT BONE GRAFT, VIVIGEN (N/A) Awake, alert and oriented x 4. Complaints of abdomenal distension and discomfort. No voiding post urinary catheter d/c this AM.  In and out cath with 600 cc. I will start indwelling foley due to retention. Give reglan to promote bowel function.  Patient reports pain as marked.    Objective:   VITALS:  Temp:  [97.6 F (36.4 C)-98.5 F (36.9 C)] 98.1 F (36.7 C) (12/15 1454) Pulse Rate:  [90-107] 103 (12/15 1454) Resp:  [12-27] 17 (12/15 1454) BP: (107-147)/(62-90) 137/62 (12/15 1454) SpO2:  [93 %-99 %] 94 % (12/15 1454) Arterial Line BP: (131-140)/(89-107) 138/93 (12/14 1715)  Neurologically intact Neurovascular intact Sensation intact distally Intact pulses distally Dorsiflexion/Plantar flexion intact Incision: dressing C/D/I, no drainage and hemovac intacted   LABS Recent Labs    03/08/20 1535 03/09/20 0341  HGB 8.8* 10.7*  WBC  --  12.7*  PLT  --  264   Recent Labs    03/08/20 1535 03/09/20 0341  NA 139 134*  K 3.7 3.8  CL  --  101  CO2  --  24  BUN  --  7*  CREATININE  --  0.72  GLUCOSE  --  133*   No results for input(s): LABPT, INR in the last 72 hours.   Assessment/Plan: 1 Day Post-Op Procedure(s) (LRB): REMOVAL OF SPINAL CORD STIMULATOR AND BATTERY, REMOVAL OF HARDWARE L1 TO L3, TRANSFORAMINAL LUMBAR INTERBODY FUSION LEFT L1-2 AND T12-L1, POSTERIOR FUSION INSTRUMENTATION T10, T11, T12, L1 AND L2, LOCAL BONE GRAFT, ALLOGRAFT BONE GRAFT, VIVIGEN (N/A) Dural repair right L1-2 with suture and duraseal and duragen patch.  Post op abdomenal distentionf Advance diet Up with therapy D/C IV fluids Discharge home with home [id number]  [name] 03/09/2020,  3:53 PMPatient ID: [name], female   DOB: 08/14/1955, 64 y.o.   MRN: [id number]

## 2020-03-09 NOTE — Anesthesia Postprocedure Evaluation (Signed)
Anesthesia Post Note  Patient: [name]  Procedure(s) Performed: REMOVAL OF SPINAL CORD STIMULATOR AND BATTERY, REMOVAL OF HARDWARE L1 TO L3, TRANSFORAMINAL LUMBAR INTERBODY FUSION LEFT L1-2 AND T12-L1, POSTERIOR FUSION INSTRUMENTATION T10, T11, T12, L1 AND L2, LOCAL BONE GRAFT, ALLOGRAFT BONE GRAFT, VIVIGEN (N/A )     Patient location during evaluation: PACU Anesthesia Type: General Level of consciousness: awake and alert Pain management: pain level controlled Vital Signs Assessment: post-procedure vital signs reviewed and stable Respiratory status: spontaneous breathing, nonlabored ventilation, respiratory function stable and patient connected to nasal cannula oxygen Cardiovascular status: blood pressure returned to baseline and stable Postop Assessment: no apparent nausea or vomiting Anesthetic complications: no   No complications documented.  Last Vitals:  Vitals:   03/08/20 2258 03/09/20 0422  BP: 132/75 135/71  Pulse: 90 (!) 101  Resp: 15 16  Temp: 36.8 C 36.9 C  SpO2: 97% 95%    Last Pain:  Vitals:   03/09/20 0422  TempSrc: Oral  PainSc:                  Toan Mort DAVID

## 2020-03-09 NOTE — Op Note (Signed)
03/08/2020  6:00 PM  PATIENT:  [name]  64 y.o. 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, Kyphosis L1-2, spondylolisthesis L1-2 Degenerative Disc Disease thoracolumbar junction above L2-S1 fusion, retained spinal cord stimulator and battery  PROCEDURE:  Procedure(s): REMOVAL OF SPINAL CORD STIMULATOR AND BATTERY, REMOVAL OF HARDWARE L1 TO L3, TRANSFORAMINAL LUMBAR INTERBODY FUSION LEFT L1-2 AND T12-L1, POSTERIOR FUSION INSTRUMENTATION T10, T11, T12, L1 AND L2, LOCAL BONE GRAFT, ALLOGRAFT BONE GRAFT, VIVIGEN    SURGEON:  Jessy Oto, MD     ASSISTANT:  Benjiman Core, PA-C  (Present throughout the entire procedure and necessary for completion of procedure in a timely manner)     ANESTHESIA:  General, supplemented with local marcaine 0.5% 20CC then at the end of the case marcaine 0.5% 1:1 exparel 1.3% total 30cc(over 5 hours post first marcaine injection. Dr. Lillia Abed, MD.    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     COMPONENTS:  Implant Name Type Inv. Item Serial No. Manufacturer Lot No. LRB No. Used Action  BONE CANC CHIPS 40CC - [id number] Bone Implant BONE [facility] CHIPS 40CC [id number] [employer] [place] [employer]  N/A 1 Implanted  BONE VIVIGEN FORMABLE 10CC - [id number] Bone Implant BONE VIVIGEN FORMABLE 10CC [phone] [employer] [place] [employer]  N/A 1 Implanted  BONE VIVIGEN FORMABLE 10CC - [id number] Bone Implant BONE VIVIGEN FORMABLE 10CC [id number] [employer] [place] [employer]  N/A 1 Implanted  GRAFT DURAGEN MATRIX 1WX1L - [id number] Tissue GRAFT DURAGEN MATRIX 1WX1L  [employer]  [id number] N/A 1 Implanted  SCREW VIPER CORT FIX 4.35X40 - [id number] Screw SCREW VIPER CORT FIX 4.35X40  [employer]  N/A 2 Implanted  SCREW CORT FIX FEN 5.5X6X40MM - [id number] Screw SCREW CORT FIX FEN 5.5X6X40MM  [employer]  N/A 2 Implanted  SCREW CORT FIX FEN 5.5X5X35MM - [id number] Screw SCREW CORT FIX FEN 5.5X5X35MM  [employer]  N/A 2 Implanted  SCREW VIPER CORT FIX 4.35X35 - [id number] Screw SCREW VIPER CORT FIX 4.35X35  [employer]  N/A 2 Implanted  SCREW SET SINGLE INNER - [id number] Screw SCREW SET SINGLE INNER  [employer]  N/A 8 Implanted  CAGE SABLE 10X22 6-12 8D - [id number] Cage CAGE SABLE 10X22 6-12 8D  [employer] [id number] N/A 1 Implanted and Explanted  CAGE SABLE 10X22 6-12 8D - [id number] Cage CAGE SABLE 10X22 6-12 8D  [employer] [id number] N/A 1 Implanted and Explanted  CAGE SABLE 10X22 6-12 8D - [id number] Cage CAGE SABLE 10X22 6-12 8D  [employer] [id number] N/A 1 Implanted and Explanted  SUSTAIN-RT Titanium spacer      N/A 1 Implanted     INTRA NEUROMONITORING STUDIES: T10   Left 20+  Right  14 T11   Left 15    Right  13 T12   Left 20+  Right  20+  L1    Left 20+  Right  16  L2    Left 20+  RIght 20 No intraoperative neuromonitoring changes noted during the entire case including both sensory evoked potentials and motor evoked potentials.     PROCEDURE:    The patient was met in the holding area, and the appropriate  T10 to leftt L1-2 and T12-L1 lumbar levels identified and marked with an "X" and my initials. I had discussion with the patient in the preop holding area regarding consent form. Patient understands the rationale for the fusion site as the L1-2 and T12-L1 segments for stenosis and degenerative retrolisthesis with kyphosis L1-2 and pseudarthrosis above previous fusion L2 to S1 with extension of the fusion to the T10 level inorder to bridge the thoracolumbar junction. She is also undergo removal  of a spinal cord stimulator that is not functioning. She was transported to the OR Room 5 and recieved appropriate preoperative antibiotic prophylaxis ancef 2 gm.  Nursing staff inserted a Foley catheter under sterile conditions. The patient was then turned to a prone position using the Mildred spine frame. PAS. all pressure points well padded the arms at the side to 90 90. Standard prep with DuraPrep solution draped in the usual manner from the mid dorsal spine to the sacral segment. Iodine Vi-Drape was used and the incision was marked. Time-out procedure was called and correct. Loupe magnification and headlight were used during this portion procedure.  Skin in the midline between D9 and L5 was then infiltrated with local marcaine 0.5% 1:1 exparel 1.3% total of 30 cc used. Incision was then made through the skin and subcutaneous layers down to the patient's lumbodorsal fascia elipsing the old incision scar for previous lumbar fusion and the right sided parallel incision made to place the spinal cord stimulator. The right lower back lateral transverse incision for placement of the SCS battery was incised after infiltration. The spinal cord stimulator battery freed up from its fibrous incasement and delivered out of its pocket, the midline thoracolumbar  incision then carried sharply along the supraspinous ligament at the T11-12 level and the spinal cord stimulator leads identified. The leads free up and the suture and metal clips removed and the two leads easily removed with gentle pull.The leads then divided at the battery and pulled through the subcutaneous tunnel to the midline incision and removed. The incision for removal of the spinal cord stimulator battery was then debrided of the fibrous bursa envelop and irrigated, hemostasis then obtained and the incision closed with interrupted #1 vicryl suture then interrupted 2-0 vicryl sutures then stainless steel staples. No monopolar electrocautery use, only  bipolar used to prevent transmission along the electrodes while removing the SCS leads and battery.  Then continuing the midline incision along the lateral aspects of the spinous processes T10, T11, T12,L1 and L2 and carried lateral over the lateral facets at L2-3 and L4 and L5.Belenda Cruise elevators used to carefully elevate the paralumbar muscles off of the posterior elements using electrocautery carefully drilled bleeding and perform dissection of the muscle tissues of the resecting the facet capsules at T10-T11, T11-T12, T12-L1and L1-2 and L2-3. Continuing the exposure out laterally to expose the retained hooks, pedicle screws and rods at L1, L2, L3, and L4. Bleeding controlled using electrocautery monopolar electrocautery.  Cerebellar retractors were used for the thoracolumbar incision. The cap screws then removed bilaterally at the hooks over the superior aspect of the L1 lamina and bilaterally at the hooks over the superior aspect of the L2 lamina. The medial screws for the inter rod sleeves located between the pedicle screw fasteners at L3 and L4 bilaterally were loosened and the rods removed from L1 to L3-4.The hooks at L1 and L2 then removed with the hook holders and hook pushers.   C-arm fluoroscopy was then brought into the field and using  C-arm fluoroscopy The spinous processes of L1, T12, T11, and T10 were identified and marked with removal of a small amount of the spinous processes. Then a hole made into the lateral aspect of the left pedicle of L1 observed in the pedicle using ball-tipped nerve probe initial entry was determined on fluoroscopy to be good position alignment so that a ball handled thoracic pedicle probe was then used to probe the left L1 pedicle to a depth of nearly 40 mm observed on C-arm fluoroscopy to be beyond the midpoint of the lumbar vertebra and then position alignment within the left L1 pedicle this was then removed and the pedicle channel probed demonstrating patency no sign of  rupture the cortex of the pedicle. No tapping performed then 4.35 mm x 40 mm screw was placed on table for later placement on the left side at the L1 level following TLIF left L1-2. C-arm fluoroscopy was then brought into the field and using C-arm fluoroscopy then a hole made into the middle of the pedicle of right L1 observed in the pedicle using ball tipped nerve hook and hockey stick nerve probe initial entry was determined on fluoroscopy to be good position alignment so that a ball handled probe was then used to probe the right L1 pedicle opening to a depth of nearly 40 mm observed on C-arm fluoroscopy to be beyond the midpoint of the lumbar vertebra and then position alignment within the right L1 pedicle this was then removed and the pedicle channel probed demonstrating patency no sign of rupture the cortex of the pedicle. No tapping performed then 4.35 mm x 54m screw was placed on the right side at the L1 level. C-arm fluoroscopy was then brought into the field and using C-arm fluoroscopy then a hole made into the lateral aspect of the left pedicle of T12 observed in the pedicle using ball tipped nerve hook and hockey stick nerve probe initial entry was determined on fluoroscopy to be good position alignment so that a ball handled probe was then used to probe the left T12 pedicle to a depth of nearly 40 mm observed on C-arm fluoroscopy to be well aligned within the left T12 pedicle, the pedicle channel probed demonstrating patency no sign of rupture the cortex of the pedicle. Tapping with a 4.370mtap then a 6 mm screw then 6.0 mm x 40 mm screw was placed  on the left side at the T12 level. C-arm fluoroscopy was used to localize the hole made in the lateral aspect of the pedicle of T12 on the right localizing the pedicle within the spinal canal with nerve hook and hockey-stick nerve probe carefully passed down the center of the T12 pedicle to a depth of nearly 40 mm. Observed on C-arm fluoroscopy to be in  good position alignment channel was probed with a ball-tipped probe ensure patency no sign of cortical disruption. Following tapping with a 4.35 mm and then a 5.0 mm tap and a 6.0 x 40 mm screw was placed  on the right side at the T12 level. Using C-arm fluoroscopy then a hole made into the centrall aspect of the left pedicle of T11 observed in the pedicle using ball-tipped nerve hook and hockey stick nerve probe initial entry was determined on fluoroscopy to be good position alignment so that a ball handled probe was then used to probe the left T11 pedicle to a depth of nearly 40 mm observed on C-arm fluoroscopy to be beyond the midpoint of the lumbar vertebra and  then position alignment within the left T11 pedicle this was then removed and the pedicle channel probed demonstrating patency no sign of rupture the cortex of the pedicle. Tapping with a 4.35 mm screw then a 5.0 mm x 40 mm screw was placed on the left side at the T11 level. Using C-arm fluoroscopy then a hole made into the middle of the pedicle of right T11 observed in the pedicle using ball tipped nerve hook and hockey stick nerve probe initial entry was determined on fluoroscopy to be good position alignment so that a ball handled probe was then used to probe the right T11 pedicle opening to a depth of nearly 40 mm observed on C-arm fluoroscopy to be beyond the midpoint of the lumbar vertebra and then position alignment within the right T11 pedicle this was then removed and the pedicle channel probed demonstrating patency no sign of rupture the cortex of the pedicle. Tapping with a 4.35 mm screw tap and then a 5.0 mm x 40 mm screw was placed on the right side at the T11 level. Using C-arm fluoroscopy then a hole made into the lateral aspect of the left pedicle of T10 observed in the pedicle using ball tipped nerve hook and hockey stick nerve probe initial entry was determined on fluoroscopy to be good position alignment so that a ball handled probe  was then used to probe the left T10 pedicle to a depth of nearly 35 mm observed on C-arm fluoroscopy to be well aligned within the left T10 pedicle, the pedicle channel probed demonstrating patency no sign of rupture the cortex of the pedicle. No tapping was then a 4.35 mm x 35 mm screw was placed  on the left side at the T10 level. C-arm fluoroscopy was used to localize the hole made in the central aspect of the pedicle of T10 on the right localizing the pedicle within the opening with a ball tipped probe and ball tipped probe carefully passed down the center of the T10 pedicle to a depth of nearly 35 mm. Observed on C-arm fluoroscopy to be in good position alignment channel was probed with a ball-tipped probe ensure patency no sign of cortical disruption. No tapping was performed then a 4.35 mm x 35 mm screw was placed  on the right side at the T10 level. . Intraoperative neuromonitoring was performed throughout the case and resistance testing then carried out of each of the pedicle screws the resistance value for each screw  listed above.    A blunt nerve hook used to explore the spinal canal and ensure Both the right L1 and L2 nerve roots were well decompressed. At the L1-2 level the  lateral recess and the neuroforamen were resected decompressing the L1and L2 nerve roots and foraminotomies was widely performed over the right L1 nerve and L2 nerve roots.   Attention then turned to placement of the left L1-2 transforaminal lumbar interbody fusion cages. Using a Penfield 4 the lateral aspect of the thecal sac and the inferior aspect of the L1 nerve root on the left side at the L1-2 level was carefully drilled. The thecal sac could then easily be retracted in the posterior lateral aspect of the L1-2 disc was exposed 15 blade scalpel used to incise the osteotome used to resect a small portion of bone off the superior aspect of the posterior superior vertebral body of L2 in order to ease the entry into the  L1-2 disc space. A pituitary rongeur was then able to be  introduced in the disc space debrided it of degenerative disc material. 5 mm dilator was used to dialate  the L1-2 disc space on the left side attempts were made to dilate further in increments to 7 mm successfully and using small curettes and the disc space was debrided a minimal degenerative disc present in the endplates debrided to bleeding endplate bone.Trial cage placement within the disc place could only allow for an 7 mm lordotic bullet proTi cage. A 7 mm bullet cage was carefully packed with Vivigen and local bone graft that been harvested from previous laminotomies additional bone graft local morselized, cancellous allograft chips and vivigen was then packed into the intervertebral disc space using the 7 mm trial to impacted the graft multiple times. With this then a 7 mm x 22 mm cage was introduced into the disc space on the left side in the correct degree convergence and then impacted then subset beneath the posterior aspect of the disc space by about 3 or 4 mm. The cage the elevated using the long screw drive. Then packing of the cage carried out with the sleeve. Bleeding controlled using bipolar electrocautery thrombin soaked gel cottonoids. Then turned to the right L1-2 level similarly the exposure the posterior lateral aspect this was carried out using a Penfield 4 bipolar electrocautery to control small bleeders present.    Bleeding was hemostasis attention was turned to the placement of the rods and Rod sleeves. The exposed retained hardware bilateral L3 to L4 pedicle screw fasteners and rods. The old bilateral interrod connectors at the L3-4 level were reused using the insertion crosslink screw driver. The rod interconnecting sleeves connected to the old retained L3-S1 rod at the L3-4 level bilaterally. With the transforaminal lumbar interbody fusion portion of the case  completed bleeders were controlled using bipolar electrocautery  thrombin-soaked Gelfoam were appropriate. 5 pedicle screws on the left and 4  Pedicle screws on the right and then each carefully placed and aligned to allow for placement of rods. The 428m rod was cut then contoured using a template on the left side and  placed into the pedicle screws on the left extending from L4-5 rod connector sleeve into each of the left L2 and L1 pedicle fastener extending into the fasteners at T12, T11 and T10 and each of the caps carefully placed loosely tightened. Attention turned to the right side were similarly screws were carefully adjusted to allow for a better pattern screws to allow for placement of fixation rod template for the rod was then taken and a cut quarter inch titanium rod was placed. The rod placed into the connector sleeves at the L3-5 level and reduced into the right L1, T12, T11 and T10 pedicle fasteners. The fastener caps were then placed. Both rod connector sleeve screws were tightened to 80 foot lbs. The right L1 rod fastener cap was then tightened 85 pounds similarly on at the right side disc space at L1-L3 screw fasteners were compressed  to compress the L2-3 disc and L1-2 disc and the respective TLIFs.  Posteriorly and the L1 fastener cap was torqued to 85 foot lbs, the right side between L1 and T12 then compressed by loosening the cap at the T12 level, using the compressor and tightening the right T12 cap to 85 foot lbs.  Similarly this was done on the left side at L1-2, L2-3 and T12-L1 sleeve screws were compressed and tightened 85 pounds.The areas between T10-T11 and T11-T12 were secured by tightening with the torque driver without  distraction or compression. A single transverse loading rod was placed at the L2-3 level. Measuring and placing an A-2 transverse loading rod and tightening the attaching nuts to 80 foot lbs.  Irrigation was carried out with copious amounts of saline solution this was done throughout the case. Cell Saver was used during the case.  Total cell saver returned blood was 860 cc. Permanent C-arm images were obtained in AP and lateral planes. Remaining local bone graft was then applied along the left from T10  through L3 and right lateral posterior region extending from T10  through L1 posterior facets following decortication of the facets and along the left T10-L3 interlaminar area posteriorly. Gelfoam was then removed. The entire incision then layered with vancomycin powder 1 gram. The lumbodorsal musculature carefully exam debrided of any devitalized tissue following removal of Vicryl retractors were the bleeders were controlled using electrocautery and the area dorsal lumbar muscle were then approximated in the midline with interrupted #1 Vicryl sutures loose the dorsal fascia was reattached to the spinous process of T10, T11, T12, L1 and L2 to superiorly and  inferiorly this was done with #1 Vicryl sutures. The para lumbar muscle approximated over the lower incision site with #1 vicryl A Hemovac drain was placed superficial to the lumbodorsal fascia exiting over the right lower lumbar spine. The subcutaneous layers then approximated over the drain using interrupted 0 Vicryl sutures and 2-0 Vicryl sutures. Skin was closed with stainless steel staples then MedPlex bandage. All instrument and sponge counts were correct. The patient was then returned to a supine position on her bed reactivated extubated and returned to the recovery room in satisfactory condition.     Benjiman Core, PA-C perform the duties of assistant surgeon during this case. He was present from the beginning of the case to the end of the case assisting in transfer the patient from his stretcher to the OR table and back to the stretcher at the end of the case. Assisted in careful retraction and suction of the laminectomy site delicate neural structures operating under the operating room microscope. He performed closure of the incision from the fascia to the skin applying the  dressing.    [name]   03/09/2020,Danita Proud Louanne Skye

## 2020-03-09 NOTE — Plan of Care (Signed)
  Problem: Activity: Goal: Risk for activity intolerance will decrease Outcome: Not Progressing   Problem: Elimination: Goal: Will not experience complications related to urinary retention Outcome: Not Progressing

## 2020-03-09 NOTE — Progress Notes (Signed)
Valium IV 8mg /1.76ml wasted in stericycle and witnessed by Ledell Noss  RN

## 2020-03-09 NOTE — Progress Notes (Signed)
Subjective: C/o nausea and vomiting.  States does not tolerated narcotic medications. Some abd discomfort.  Last bowel movement yesterday AM.  Denies headache.    Objective: Vital signs in last 24 hours: Temp:  [97.6 F (36.4 C)-98.5 F (36.9 C)] 98.2 F (36.8 C) (12/15 0753) Pulse Rate:  [90-107] 102 (12/15 0753) Resp:  [12-27] 14 (12/15 0753) BP: (107-147)/(71-90) 107/90 (12/15 0753) SpO2:  [93 %-99 %] 95 % (12/15 0753) Arterial Line BP: (131-140)/(89-107) 138/93 (12/14 1715)  Intake/Output from previous day: 12/14 0701 - 12/15 0700 In: 4400 [I.V.:3300; Blood:150; IV Piggyback:750] Out: 1010 [Urine:510; Drains:50; Blood:450] Intake/Output this shift: No intake/output data recorded.  Recent Labs    03/08/20 1535 03/09/20 0341  HGB 8.8* 10.7*   Recent Labs    03/08/20 1535 03/09/20 0341  WBC  --  12.7*  RBC  --  3.35*  HCT 26.0* 30.1*  PLT  --  264   Recent Labs    03/08/20 1535 03/09/20 0341  NA 139 134*  K 3.7 3.8  CL  --  101  CO2  --  24  BUN  --  7*  CREATININE  --  0.72  GLUCOSE  --  133*  CALCIUM  --  7.4*   No results for input(s): LABPT, INR in the last 72 hours.  Exam: Patient alert but somewhat lethargic. Dressing c/d/i. NVI. No focal motor deficits. bilat calves nontender.  Abdomen diffusely tender, some distention.     Assessment/Plan: D/c all narcotic medication.  Discussed with Dr Louanne Skye. Will give valium Q6hrs prn.  Order KUB to rule out ileus. Must lay flat until cleared by Dr Louanne Skye due to dural tear/repair.      Benjiman Core 03/09/2020, 8:58 AM

## 2020-03-09 NOTE — Progress Notes (Signed)
OT Cancellation Note  Patient Details Name: [name] MRN: [id number] DOB: 02/12/1956   Cancelled Treatment:    Reason Eval/Treat Not Completed: Medical issues which prohibited therapy;Patient not medically ready. Per chart review and secure chat with MD, pt to lay flat for 48 hours after surgery. "Start time 03/08/20 at 1600." Will follow-up for OT eval on 12/16 after 1600.   Layla Maw 03/09/2020, 7:28 AM

## 2020-03-10 LAB — CBC WITH DIFFERENTIAL/PLATELET
Abs Immature Granulocytes: 0.11 10*3/uL — ABNORMAL HIGH (ref 0.00–0.07)
Basophils Absolute: 0 10*3/uL (ref 0.0–0.1)
Basophils Relative: 0 %
Eosinophils Absolute: 0 10*3/uL (ref 0.0–0.5)
Eosinophils Relative: 0 %
HCT: 29.5 % — ABNORMAL LOW (ref 36.0–46.0)
Hemoglobin: 10.2 g/dL — ABNORMAL LOW (ref 12.0–15.0)
Immature Granulocytes: 1 %
Lymphocytes Relative: 12 %
Lymphs Abs: 1.9 10*3/uL (ref 0.7–4.0)
MCH: 30.8 pg (ref 26.0–34.0)
MCHC: 34.6 g/dL (ref 30.0–36.0)
MCV: 89.1 fL (ref 80.0–100.0)
Monocytes Absolute: 1.9 10*3/uL — ABNORMAL HIGH (ref 0.1–1.0)
Monocytes Relative: 12 %
Neutro Abs: 11.9 10*3/uL — ABNORMAL HIGH (ref 1.7–7.7)
Neutrophils Relative %: 75 %
Platelets: 257 10*3/uL (ref 150–400)
RBC: 3.31 MIL/uL — ABNORMAL LOW (ref 3.87–5.11)
RDW: 12.3 % (ref 11.5–15.5)
WBC: 15.8 10*3/uL — ABNORMAL HIGH (ref 4.0–10.5)
nRBC: 0 % (ref 0.0–0.2)

## 2020-03-10 LAB — BASIC METABOLIC PANEL
Anion gap: 8 (ref 5–15)
BUN: <5 mg/dL — ABNORMAL LOW (ref 8–23)
CO2: 23 mmol/L (ref 22–32)
Calcium: 7.8 mg/dL — ABNORMAL LOW (ref 8.9–10.3)
Chloride: 96 mmol/L — ABNORMAL LOW (ref 98–111)
Creatinine, Ser: 0.64 mg/dL (ref 0.44–1.00)
GFR, Estimated: >60 mL/min (ref >60)
Glucose, Bld: 155 mg/dL — ABNORMAL HIGH (ref 70–99)
Potassium: 3.2 mmol/L — ABNORMAL LOW (ref 3.5–5.1)
Sodium: 127 mmol/L — ABNORMAL LOW (ref 135–145)

## 2020-03-10 MED ORDER — POTASSIUM CHLORIDE IN NACL 40-0.9 MEQ/L-% IV SOLN
INTRAVENOUS | Status: DC
  Filled 2020-03-10 (×2): qty 1000

## 2020-03-10 NOTE — Progress Notes (Signed)
     Subjective: 2 Days Post-Op Procedure(s) (LRB): REMOVAL OF SPINAL CORD STIMULATOR AND BATTERY, REMOVAL OF HARDWARE L1 TO L3, TRANSFORAMINAL LUMBAR INTERBODY FUSION LEFT L1-2 AND T12-L1, POSTERIOR FUSION INSTRUMENTATION T10, T11, T12, L1 AND L2, LOCAL BONE GRAFT, ALLOGRAFT BONE GRAFT, VIVIGEN (N/A) Awake, alert and oriented x 4. Flatus ++, No headaches of stiff neck. No leg numbness or weakness. Start mobilizing today. May or may not use brace out of bed, prefer comfort prior to bracing. Abdomen is less tense. Foley to SD.   Patient reports pain as moderate.    Objective:   VITALS:  Temp:  [98 F (36.7 C)-98.9 F (37.2 C)] 98 F (36.7 C) (12/16 0830) Pulse Rate:  [91-103] 100 (12/16 0830) Resp:  [14-19] 14 (12/16 0830) BP: (97-139)/(62-68) 97/65 (12/16 0830) SpO2:  [94 %-96 %] 94 % (12/16 0830)  Neurologically intact ABD soft Neurovascular intact Sensation intact distally Intact pulses distally Dorsiflexion/Plantar flexion intact Incision: moderate drainage No cellulitis present Compartment soft Hemovac discontinued   LABS Recent Labs    03/08/20 1535 03/09/20 0341 03/10/20 0326  HGB 8.8* 10.7* 10.2*  WBC  --  12.7* 15.8*  PLT  --  264 257   Recent Labs    03/09/20 0341 03/10/20 0326  NA 134* 127*  K 3.8 3.2*  CL 101 96*  CO2 24 23  BUN 7* <5*  CREATININE 0.72 0.64  GLUCOSE 133* 155*   No results for input(s): LABPT, INR in the last 72 hours.   Assessment/Plan: 2 Days Post-Op Procedure(s) (LRB): REMOVAL OF SPINAL CORD STIMULATOR AND BATTERY, REMOVAL OF HARDWARE L1 TO L3, TRANSFORAMINAL LUMBAR INTERBODY FUSION LEFT L1-2 AND T12-L1, POSTERIOR FUSION INSTRUMENTATION T10, T11, T12, L1 AND L2, LOCAL BONE GRAFT, ALLOGRAFT BONE GRAFT, VIVIGEN (N/A)  Advance diet Up with therapy Decrease IV fluids  [name] 03/10/2020, 2:00 PMPatient ID: [name], female   DOB: [birth year], 64 y.o.   MRN: [id number]

## 2020-03-10 NOTE — Progress Notes (Signed)
OT Cancellation Note  Patient Details Name: KATHRINA CROSLEY MRN: [id number] DOB: 1955-06-05   Cancelled Treatment:    Reason Eval/Treat Not Completed: Medical issues which prohibited therapy;Patient not medically ready. Per MD, pt to lay flat for 48 hours s/p surgery. "Start time 03/08/20 at 1600." Will follow-up for OT eval after 1600 today at end of 48 hour bed rest restriction.   Layla Maw 03/10/2020, 7:16 AM

## 2020-03-10 NOTE — TOC Initial Note (Addendum)
Transition of Care [facility]) - Initial/Assessment Note    Patient Details  Name: [name] MRN: [id number] Date of Birth: 1956/01/20  Transition of [facility]) CM/SW Contact:    Curlene Labrum, RN Phone Number: 03/10/2020, 3:32 PM  Clinical Narrative:                 Case management met with the patient at the bedside regarding transitions of care to home following spinal fusion surgery by Dr. Louanne Skye.  The patient states that she had her back drain removed and has a dressing in place.  She plans to be evaluated by PT/Ot this afternoon and I will follow the patient for home health needs and dme.  The patient lives with her significant other in a single-wide mobile home and has access to a rolling walker and raised toilet seat at home.  Will continue to follow for discharge needs for home once she is assessed by PT today.  Offered Medicare choice to the patient and she did not have a preference for home health services.  I called Tommi Rumps, RNCM with [facility] and they are willing to provide the patient with PT/OT services.   Expected Discharge Plan: [place] Barriers to Discharge: Continued Medical Work up   Patient Goals and CMS Choice Patient states their goals for this hospitalization and ongoing recovery are:: Patient plans on discharging home with her husband on Saturday, 03/12/2020. CMS Medicare.gov Compare Post Acute Care list provided to:: Patient Choice offered to / list presented to : Patient  Expected Discharge Plan and Services Expected Discharge Plan: [place]   Discharge Planning Services: CM Consult Post Acute Care Choice: [place] arrangements for the past 2 months: Mobile Home                                      Prior Living Arrangements/Services Living arrangements for the past 2 months: Mobile Home Lives with:: Spouse Patient language and need for interpreter reviewed:: Yes Do you feel safe going  back to the place where you live?: Yes      Need for Family Participation in Patient Care: Yes (Comment) Care giver support system in place?: Yes (comment) Current home services: DME (Patient has a rolling walker and raised toilet seat at home.) Criminal Activity/Legal Involvement Pertinent to Current Situation/Hospitalization: No - Comment as needed  Activities of Daily Living Home Assistive Devices/Equipment: None ADL Screening (condition at time of admission) Patient's cognitive ability adequate to safely complete daily activities?: Yes Is the patient deaf or have difficulty hearing?: No Does the patient have difficulty seeing, even when wearing glasses/contacts?: No Does the patient have difficulty concentrating, remembering, or making decisions?: No Patient able to express need for assistance with ADLs?: Yes Does the patient have difficulty dressing or bathing?: No Independently performs ADLs?: Yes (appropriate for developmental age) Does the patient have difficulty walking or climbing stairs?: No Weakness of Legs: Both Weakness of Arms/Hands: None  Permission Sought/Granted Permission sought to share information with : Case Manager Permission granted to share information with : Yes, Verbal Permission Granted     Permission granted to share info w AGENCY: [facility]  Permission granted to share info w Relationship: Lupita Dawn - significant other     Emotional Assessment Appearance:: Appears stated age Attitude/Demeanor/Rapport: Gracious Affect (typically observed): Accepting Orientation: : Oriented to Self,Oriented to Place,Oriented  to  Time,Oriented to Situation Alcohol / Substance Use: Not Applicable Psych Involvement: No (comment)  Admission diagnosis:  Fusion of spine of thoracolumbar region [M43.25] Patient Active Problem List   Diagnosis Date Noted  . Spondylolysis, lumbar region 03/09/2020    Class: Chronic  . Incidental durotomy 03/09/2020    Class:  Acute  . Fusion of spine of thoracolumbar region 03/08/2020  . Seasonal and perennial allergic rhinitis 02/18/2019  . Ileus, postoperative ([place]) 08/13/2014    Class: Acute  . Pseudarthrosis after fusion or arthrodesis 08/09/2014    Class: Chronic  . Painful orthopaedic hardware ([place]) 08/09/2014    Class: Chronic  . Vulvar cysts 04/26/2014  . Spinal stenosis, lumbar region, with neurogenic claudication 04/07/2013    Class: Chronic  . Degenerative spondylolisthesis 04/07/2013    Class: Chronic  . Spinal stenosis of lumbar region with neurogenic claudication 04/07/2013   PCP:  Caryl Bis, MD Pharmacy:   [place], [address] [place] [address] Phone: [phone] Fax: [phone]     Social Determinants of Health (SDOH) Interventions    Readmission Risk Interventions Readmission Risk Prevention Plan 03/10/2020  Post Dischage Appt Complete  Medication Screening Complete  Transportation Screening Complete  Some recent data might be hidden

## 2020-03-10 NOTE — Plan of Care (Signed)

## 2020-03-11 LAB — BASIC METABOLIC PANEL
Anion gap: 8 (ref 5–15)
BUN: 5 mg/dL — ABNORMAL LOW (ref 8–23)
CO2: 27 mmol/L (ref 22–32)
Calcium: 8.2 mg/dL — ABNORMAL LOW (ref 8.9–10.3)
Chloride: 98 mmol/L (ref 98–111)
Creatinine, Ser: 0.67 mg/dL (ref 0.44–1.00)
GFR, Estimated: >60 mL/min (ref >60)
Glucose, Bld: 152 mg/dL — ABNORMAL HIGH (ref 70–99)
Potassium: 3.6 mmol/L (ref 3.5–5.1)
Sodium: 133 mmol/L — ABNORMAL LOW (ref 135–145)

## 2020-03-11 MED ORDER — DIAZEPAM 2 MG PO TABS: 2.0000 mg | ORAL_TABLET | Freq: Four times a day (QID) | ORAL | 0 refills | Status: DC | PRN

## 2020-03-11 MED ORDER — DOCUSATE SODIUM 100 MG PO CAPS: 100.0000 mg | ORAL_CAPSULE | Freq: Two times a day (BID) | ORAL | 0 refills | Status: DC

## 2020-03-11 MED ORDER — METHOCARBAMOL 500 MG PO TABS: 500.0000 mg | ORAL_TABLET | Freq: Four times a day (QID) | ORAL | 1 refills | Status: DC | PRN

## 2020-03-11 NOTE — Evaluation (Signed)
Occupational Therapy Evaluation Patient Details Name: EVVA DIN MRN: [id number] DOB: 1955-11-05 Today's Date: 03/11/2020    History of Present Illness Patient is a 64 y/o female with history of spinal cord stimulator, lumbar stenosis, and previous back surgeries. Patient s/p removal of spinal cord stimulator, hardware removal L1-L3, posterior fusion T10-L2 on 12/14. PMH includes asthma, cancer, fibromyalgia, GERD, hiatal hernia, HTN, sleep apnea.   Clinical Impression   Pt is at mod A level with LB ADLs, Sup with ADL mobility using RW. Pt will have assist at home from family and also has all necessary A/E and DME at home. All education completed and no further acute OT services are indicated at this time. Pt to possibly d/c home later today, OT will sign off    Follow Up Recommendations  No OT follow up;Supervision - Intermittent    Equipment Recommendations  None recommended by OT    Recommendations for Other Services       Precautions / Restrictions Precautions Precautions: Back;Fall Precaution Comments: reviewed back precautions with pt Required Braces or Orthoses: Spinal Brace Spinal Brace: Thoracolumbosacral orthotic;Applied in sitting position Restrictions Weight Bearing Restrictions: No      Mobility Bed Mobility Overal bed mobility: Needs Assistance Bed Mobility: Supine to Sit;Sit to Supine     Supine to sit: Min guard Sit to supine: Supervision   General bed mobility comments: increased time to sup - sit    Transfers Overall transfer level: Needs assistance Equipment used: Rolling walker (2 wheeled) Transfers: Sit to/from Stand Sit to Stand: Supervision         General transfer comment: supervision for safety due to unsteadiness upon standing    Balance Overall balance assessment: Needs assistance Sitting-balance support: Single extremity supported;Feet supported Sitting balance-Leahy Scale: Fair Sitting balance - Comments: reliant on single  UE support on bed   Standing balance support: No upper extremity supported;During functional activity Standing balance-Leahy Scale: Poor Standing balance comment: staggering with ambulation with no UE support                           ADL either performed or assessed with clinical judgement   ADL Overall ADL's : Needs assistance/impaired Eating/Feeding: Independent   Grooming: Wash/dry hands;Wash/dry face;Supervision/safety;Standing   Upper Body Bathing: Set up;Sitting   Lower Body Bathing: Moderate assistance;With caregiver independent assisting   Upper Body Dressing : Set up;Sitting   Lower Body Dressing: Moderate assistance;With caregiver independent assisting   Toilet Transfer: Supervision/safety;Ambulation;RW   Toileting- Water quality scientist and Hygiene: Min guard;Sit to/from stand       Functional mobility during ADLs: Supervision/safety;Rolling walker General ADL Comments: pt is familair with ADL A/E  and has all necessary DME at home     Vision Patient Visual Report: No change from baseline       Perception     Praxis      Pertinent Vitals/Pain Pain Assessment: Faces Faces Pain Scale: Hurts little more Pain Location: surgical site - back Pain Descriptors / Indicators: Grimacing;Guarding Pain Intervention(s): Premedicated before session;Monitored during session;Repositioned     Hand Dominance Right   Extremity/Trunk Assessment Upper Extremity Assessment Upper Extremity Assessment: Overall WFL for tasks assessed   Lower Extremity Assessment Lower Extremity Assessment: Defer to PT evaluation       Communication Communication Communication: No difficulties   Cognition Arousal/Alertness: Awake/alert Behavior During Therapy: WFL for tasks assessed/performed Overall Cognitive Status: Within Functional Limits for tasks assessed  General Comments       Exercises     Shoulder  Instructions      Home Living Family/patient expects to be discharged to:: Private residence Living Arrangements: Spouse/significant other Available Help at Discharge: Family;Available 24 hours/day Type of Home: House Home Access: Stairs to enter [employer] of Steps: 4 Entrance Stairs-Rails: Right;Left;Can reach both Home Layout: One level     Bathroom Shower/Tub: Occupational psychologist: Handicapped height     Home Equipment: Environmental consultant - 2 wheels;Cane - single point;Bedside commode;Shower seat;Grab bars - tub/shower          Prior Functioning/Environment                   OT Problem List: Decreased activity tolerance;Pain;Impaired balance (sitting and/or standing)      OT Treatment/Interventions:      OT Goals(Current goals can be found in the care plan section) Acute Rehab OT Goals Patient Stated Goal: to go home OT Goal Formulation: With patient/family  OT Frequency:     Barriers to D/C:            Co-evaluation              AM-PAC OT "6 Clicks" Daily Activity     Outcome Measure Help from another person eating meals?: None Help from another person taking care of personal grooming?: None Help from another person toileting, which includes using toliet, bedpan, or urinal?: A Little Help from another person bathing (including washing, rinsing, drying)?: A Little Help from another person to put on and taking off regular upper body clothing?: None Help from another person to put on and taking off regular lower body clothing?: A Little 6 Click Score: 21   End of Session Equipment Utilized During Treatment: Rolling walker;Other (comment) (3 in 1 over toilet)  Activity Tolerance: Patient tolerated treatment well Patient left: in bed;with call bell/phone within reach  OT Visit Diagnosis: Other abnormalities of gait and mobility (R26.89);Pain Pain - part of body:  (back)                Time:  -    Charges:  OT General Charges $OT  Visit: 1 Visit OT Evaluation $OT Eval Moderate Complexity: 1 Mod    Britt Bottom 03/11/2020, 2:32 PM

## 2020-03-11 NOTE — Evaluation (Signed)
Physical Therapy Evaluation Patient Details Name: [name] MRN: [id number] DOB: 11/27/1955 Today's Date: 03/11/2020   History of Present Illness  Patient is a 64 y/o female with history of spinal cord stimulator, lumbar stenosis, and previous back surgeries. Patient s/p removal of spinal cord stimulator, hardware removal L1-L3, posterior fusion T10-L2 on 12/14. PMH includes asthma, cancer, fibromyalgia, GERD, hiatal hernia, HTN, sleep apnea.  Clinical Impression  PTA, patient was independent with mobility and ADLs and lives with significant other. Patient able to recall 2/3 back precautions from previous back surgeries, able to recall 3rd with minimal cueing. Patient required minA for supine>sit for assist with trunk elevation and supervision for sit>supine. Patient demos balance deficits with OOB mobility. Patient ambulated 3' to recliner with no AD and minA due to staggering and unsteadiness. Patient ambulated 150' with RW and min guard with improved balance. Patient negotiated 2 stairs with B handrails and min guard. Patient presents with decreased activity tolerance, impaired balance, generalized weakness. Patient will benefit from skilled PT services during acute stay to address listed deficits. Recommend HHPT following discharge to maximize functional independence and safety.     Follow Up Recommendations Home health PT;Supervision for mobility/OOB    Equipment Recommendations  None recommended by PT (pt owns necessary DME)    Recommendations for Other Services       Precautions / Restrictions Precautions Precautions: Back;Fall Precaution Comments: patient able to recall 2/3 precautions from previous back surgeries. Able to recall last one with minimal cueing Required Braces or Orthoses: Spinal Brace Spinal Brace: Thoracolumbosacral orthotic;Applied in sitting position Restrictions Weight Bearing Restrictions: No      Mobility  Bed Mobility Overal bed mobility: Needs  Assistance Bed Mobility: Supine to Sit;Sit to Supine     Supine to sit: Min assist Sit to supine: Supervision   General bed mobility comments: minA for trunk elevation in supine    Transfers Overall transfer level: Needs assistance Equipment used: Rolling Franshesca Chipman (2 wheeled) Transfers: Sit to/from Stand Sit to Stand: Supervision         General transfer comment: supervision for safety due to unsteadiness upon standing  Ambulation/Gait Ambulation/Gait assistance: Min guard Gait Distance (Feet): 150 Feet Assistive device: Rolling Blayden Conwell (2 wheeled) Gait Pattern/deviations: Step-through pattern;Decreased stride length;Wide base of support Gait velocity: decreased   General Gait Details: Initially ambulated 3' to recliner with no AD and minA due to unsteadiness and staggering. Balance improved with use of RW for ambulation  Stairs Stairs: Yes Stairs assistance: Min guard Stair Management: Two rails;Alternating pattern;Forwards Number of Stairs: 2    Wheelchair Mobility    Modified Rankin (Stroke Patients Only)       Balance Overall balance assessment: Needs assistance Sitting-balance support: Single extremity supported;Feet supported Sitting balance-Leahy Scale: Fair Sitting balance - Comments: reliant on single UE support on bed   Standing balance support: No upper extremity supported;During functional activity Standing balance-Leahy Scale: Poor Standing balance comment: staggering with ambulation with no UE support                             Pertinent Vitals/Pain Pain Assessment: 0-10 Pain Score: 4  Pain Location: surgical site - back Pain Descriptors / Indicators: Grimacing;Guarding Pain Intervention(s): Monitored during session    Home Living Family/patient expects to be discharged to:: Private residence Living Arrangements: Spouse/significant other Available Help at Discharge: Family;Available 24 hours/day Type of Home: House Home  Access: Stairs to enter Entrance Stairs-Rails: Right;Left;Can  reach both Entrance Stairs-Number of Steps: 4 Home Layout: One level Home Equipment: [place] - 2 wheels;Cane - single point;Bedside commode;Shower seat;Grab bars - tub/shower      Prior Function Level of Independence: Independent               Hand Dominance        Extremity/Trunk Assessment   Upper Extremity Assessment Upper Extremity Assessment: Defer to OT evaluation    Lower Extremity Assessment Lower Extremity Assessment: Generalized weakness       Communication   Communication: No difficulties  Cognition Arousal/Alertness: Awake/alert Behavior During Therapy: WFL for tasks assessed/performed Overall Cognitive Status: Within Functional Limits for tasks assessed                                        General Comments General comments (skin integrity, edema, etc.): significant other present throughout and helpful. Educated on donning/doffing brace    Exercises     Assessment/Plan    PT Assessment Patient needs continued PT services  PT Problem List Decreased strength;Decreased activity tolerance;Decreased mobility;Decreased balance       PT Treatment Interventions Gait training;Stair training;Functional mobility training;DME instruction;Therapeutic activities;Therapeutic exercise;Balance training;Patient/family education    PT Goals (Current goals can be found in the Care Plan section)  Acute Rehab PT Goals Patient Stated Goal: to go home PT Goal Formulation: With patient Time For Goal Achievement: 03/25/20 Potential to Achieve Goals: Good    Frequency Min 5X/week   Barriers to discharge        Co-evaluation               AM-PAC PT "6 Clicks" Mobility  Outcome Measure Help needed turning from your back to your side while in a flat bed without using bedrails?: A Little Help needed moving from lying on your back to sitting on the side of a flat bed without using  bedrails?: A Little Help needed moving to and from a bed to a chair (including a wheelchair)?: A Little Help needed standing up from a chair using your arms (e.g., wheelchair or bedside chair)?: A Little Help needed to walk in hospital room?: A Little Help needed climbing 3-5 steps with a railing? : A Little 6 Click Score: 18    End of Session Equipment Utilized During Treatment: Gait belt;Back brace Activity Tolerance: Patient tolerated treatment well Patient left: in bed;with call bell/phone within reach;with bed alarm set;with family/visitor present Nurse Communication: Mobility status PT Visit Diagnosis: Unsteadiness on feet (R26.81);Other abnormalities of gait and mobility (R26.89);Muscle weakness (generalized) (M62.81)    Time: 0826-0900 PT Time Calculation (min) (ACUTE ONLY): 34 min   Charges:   PT Evaluation $PT Eval Low Complexity: 1 Low PT Treatments $Therapeutic Activity: 8-22 mins        Perrin Maltese, PT, DPT Acute Rehabilitation Services Pager [phone] Office [phone]   Chesley Noon K Allred 03/11/2020, 9:12 AM

## 2020-03-11 NOTE — Progress Notes (Addendum)
Patient given discharge instructions and stated understanding.  She has a walker at home to use.

## 2020-03-11 NOTE — Progress Notes (Signed)
     Subjective: 3 Days Post-Op Procedure(s) (LRB): REMOVAL OF SPINAL CORD STIMULATOR AND BATTERY, REMOVAL OF HARDWARE L1 TO L3, TRANSFORAMINAL LUMBAR INTERBODY FUSION LEFT L1-2 AND T12-L1, POSTERIOR FUSION INSTRUMENTATION T10, T11, T12, L1 AND L2, LOCAL BONE GRAFT, ALLOGRAFT BONE GRAFT, VIVIGEN (N/A) Awake, alert and oriented x 4. Nausea is improved and she is tolerating food intake. Flatus, up with PT in hallway and on stairs today. Wants to go home today.   Patient reports pain as moderate.    Objective:   VITALS:  Temp:  [97.5 F (36.4 C)-99.8 F (37.7 C)] 98.1 F (36.7 C) (12/17 0757) Pulse Rate:  [97-100] 100 (12/17 0757) Resp:  [17-18] 17 (12/17 0300) BP: (122-147)/(61-94) 132/94 (12/17 0757) SpO2:  [93 %-94 %] 93 % (12/17 0757)  Neurologically intact ABD soft Neurovascular intact Sensation intact distally Intact pulses distally Dorsiflexion/Plantar flexion intact Incision: no drainage and dressing changed right lower lateral lumbar SCS battery incision and midline thoracolumbar. No cellulitis present Compartment soft   LABS Recent Labs    03/08/20 1535 03/09/20 0341 03/10/20 0326  HGB 8.8* 10.7* 10.2*  WBC  --  12.7* 15.8*  PLT  --  264 257   Recent Labs    03/10/20 0326 03/11/20 0107  NA 127* 133*  K 3.2* 3.6  CL 96* 98  CO2 23 27  BUN <5* 5*  CREATININE 0.64 0.67  GLUCOSE 155* 152*   No results for input(s): LABPT, INR in the last 72 hours.   Assessment/Plan: 3 Days Post-Op Procedure(s) (LRB): REMOVAL OF SPINAL CORD STIMULATOR AND BATTERY, REMOVAL OF HARDWARE L1 TO L3, TRANSFORAMINAL LUMBAR INTERBODY FUSION LEFT L1-2 AND T12-L1, POSTERIOR FUSION INSTRUMENTATION T10, T11, T12, L1 AND L2, LOCAL BONE GRAFT, ALLOGRAFT BONE GRAFT, VIVIGEN (N/A)  Advance diet Up with therapy D/C IV fluids Discharge home with home health  Discontinue foley  [name] 03/11/2020, 10:40 AMPatient ID: [name], female   DOB: May 11, 1955, 64 y.o.   MRN:  [id number]

## 2020-03-11 NOTE — Plan of Care (Signed)
  Problem: Education: Goal: Knowledge of General Education information will improve Description: Including pain rating scale, medication(s)/side effects and non-pharmacologic comfort measures Outcome: Adequate for Discharge   Problem: Health Behavior/Discharge Planning: Goal: Ability to manage health-related needs will improve Outcome: Adequate for Discharge   Problem: Clinical Measurements: Goal: Ability to maintain clinical measurements within normal limits will improve Outcome: Adequate for Discharge Goal: Will remain free from infection Outcome: Adequate for Discharge Goal: Diagnostic test results will improve Outcome: Adequate for Discharge Goal: Respiratory complications will improve Outcome: Adequate for Discharge Goal: Cardiovascular complication will be avoided Outcome: Adequate for Discharge   Problem: Activity: Goal: Risk for activity intolerance will decrease Outcome: Adequate for Discharge   Problem: Nutrition: Goal: Adequate nutrition will be maintained Outcome: Adequate for Discharge   Problem: Coping: Goal: Level of anxiety will decrease Outcome: Adequate for Discharge   Problem: Elimination: Goal: Will not experience complications related to bowel motility Outcome: Adequate for Discharge Goal: Will not experience complications related to urinary retention Outcome: Adequate for Discharge   Problem: Pain Managment: Goal: General experience of comfort will improve Outcome: Adequate for Discharge   Problem: Safety: Goal: Ability to remain free from injury will improve Outcome: Adequate for Discharge   Problem: Skin Integrity: Goal: Risk for impaired skin integrity will decrease Outcome: Adequate for Discharge   Problem: Acute Rehab PT Goals(only PT should resolve) Goal: Pt Will Go Supine/Side To Sit Outcome: Adequate for Discharge Goal: Patient Will Transfer Sit To/From Stand Outcome: Adequate for Discharge Goal: Pt Will Ambulate Outcome: Adequate  for Discharge Goal: Pt Will Go Up/Down Stairs Outcome: Adequate for Discharge   

## 2020-03-13 LAB — AEROBIC/ANAEROBIC CULTURE W GRAM STAIN (SURGICAL/DEEP WOUND): Culture: NO GROWTH

## 2020-03-15 DIAGNOSIS — M48061 Spinal stenosis, lumbar region without neurogenic claudication: Secondary | ICD-10-CM | POA: Diagnosis not present

## 2020-03-15 DIAGNOSIS — J45909 Unspecified asthma, uncomplicated: Secondary | ICD-10-CM | POA: Diagnosis not present

## 2020-03-15 DIAGNOSIS — M5410 Radiculopathy, site unspecified: Secondary | ICD-10-CM | POA: Diagnosis not present

## 2020-03-15 DIAGNOSIS — M159 Polyosteoarthritis, unspecified: Secondary | ICD-10-CM | POA: Diagnosis not present

## 2020-03-15 DIAGNOSIS — Z4789 Encounter for other orthopedic aftercare: Secondary | ICD-10-CM | POA: Diagnosis not present

## 2020-03-17 DIAGNOSIS — M48061 Spinal stenosis, lumbar region without neurogenic claudication: Secondary | ICD-10-CM | POA: Diagnosis not present

## 2020-03-17 DIAGNOSIS — Z4789 Encounter for other orthopedic aftercare: Secondary | ICD-10-CM | POA: Diagnosis not present

## 2020-03-17 DIAGNOSIS — J45909 Unspecified asthma, uncomplicated: Secondary | ICD-10-CM | POA: Diagnosis not present

## 2020-03-17 DIAGNOSIS — M159 Polyosteoarthritis, unspecified: Secondary | ICD-10-CM | POA: Diagnosis not present

## 2020-03-17 DIAGNOSIS — M5410 Radiculopathy, site unspecified: Secondary | ICD-10-CM | POA: Diagnosis not present

## 2020-03-22 DIAGNOSIS — M5410 Radiculopathy, site unspecified: Secondary | ICD-10-CM | POA: Diagnosis not present

## 2020-03-22 DIAGNOSIS — Z4789 Encounter for other orthopedic aftercare: Secondary | ICD-10-CM | POA: Diagnosis not present

## 2020-03-22 DIAGNOSIS — M159 Polyosteoarthritis, unspecified: Secondary | ICD-10-CM | POA: Diagnosis not present

## 2020-03-22 DIAGNOSIS — M48061 Spinal stenosis, lumbar region without neurogenic claudication: Secondary | ICD-10-CM | POA: Diagnosis not present

## 2020-03-22 DIAGNOSIS — J45909 Unspecified asthma, uncomplicated: Secondary | ICD-10-CM | POA: Diagnosis not present

## 2020-03-24 ENCOUNTER — Ambulatory Visit (INDEPENDENT_AMBULATORY_CARE_PROVIDER_SITE_OTHER): Payer: Medicare Other | Admitting: Surgery

## 2020-03-24 ENCOUNTER — Ambulatory Visit: Payer: Self-pay

## 2020-03-24 ENCOUNTER — Other Ambulatory Visit: Payer: Self-pay

## 2020-03-24 DIAGNOSIS — Z981 Arthrodesis status: Secondary | ICD-10-CM

## 2020-03-24 MED ORDER — METHOCARBAMOL 500 MG PO TABS: 500.0000 mg | ORAL_TABLET | Freq: Three times a day (TID) | ORAL | 0 refills | Status: DC | PRN

## 2020-03-24 NOTE — Progress Notes (Signed)
64 year old white female who is 2 weeks status post REMOVAL OF SPINAL CORD STIMULATOR AND BATTERY, REMOVAL OF HARDWARE L1 TO L3, TRANSFORAMINAL LUMBAR INTERBODY FUSION LEFT L1-2 AND T12-L1, POSTERIOR FUSION INSTRUMENTATION T10, T11, T12, L1 AND L2, LOCAL BONE GRAFT, ALLOGRAFT BONE GRAFT, VIVIGEN returns.  States that her preop left leg pain is much improved.  She does complain of some back pain as to be expected.  Patient is not able to take any narcotic medication due to severe GI upset.  Currently using Tylenol, Celebrex and Robaxin.  States that her primary care physician gave her the okay to take Celebrex with history of renal issues.    Exam Extremity pleasant white female alert and oriented in no acute distress.  Incision looks good.  No drainage or signs of infection.  Staples removed and Steri-Strips applied.  Neurologically intact.   Plan Patient was continue wearing her brace.  Follow-up with Dr. Otelia Sergeant in 4 weeks for recheck.  I sent in a refill of Robaxin to her pharmacy.  All questions answered.  Patient will continue working with home health PT.

## 2020-03-25 DIAGNOSIS — M48061 Spinal stenosis, lumbar region without neurogenic claudication: Secondary | ICD-10-CM | POA: Diagnosis not present

## 2020-03-25 DIAGNOSIS — Z4789 Encounter for other orthopedic aftercare: Secondary | ICD-10-CM | POA: Diagnosis not present

## 2020-03-25 DIAGNOSIS — J45909 Unspecified asthma, uncomplicated: Secondary | ICD-10-CM | POA: Diagnosis not present

## 2020-03-25 DIAGNOSIS — M159 Polyosteoarthritis, unspecified: Secondary | ICD-10-CM | POA: Diagnosis not present

## 2020-03-25 DIAGNOSIS — M5410 Radiculopathy, site unspecified: Secondary | ICD-10-CM | POA: Diagnosis not present

## 2020-03-28 DIAGNOSIS — Z4789 Encounter for other orthopedic aftercare: Secondary | ICD-10-CM | POA: Diagnosis not present

## 2020-03-28 DIAGNOSIS — M5410 Radiculopathy, site unspecified: Secondary | ICD-10-CM | POA: Diagnosis not present

## 2020-03-28 DIAGNOSIS — J45909 Unspecified asthma, uncomplicated: Secondary | ICD-10-CM | POA: Diagnosis not present

## 2020-03-28 DIAGNOSIS — M159 Polyosteoarthritis, unspecified: Secondary | ICD-10-CM | POA: Diagnosis not present

## 2020-03-28 DIAGNOSIS — M48061 Spinal stenosis, lumbar region without neurogenic claudication: Secondary | ICD-10-CM | POA: Diagnosis not present

## 2020-03-31 DIAGNOSIS — M5410 Radiculopathy, site unspecified: Secondary | ICD-10-CM | POA: Diagnosis not present

## 2020-03-31 DIAGNOSIS — M159 Polyosteoarthritis, unspecified: Secondary | ICD-10-CM | POA: Diagnosis not present

## 2020-03-31 DIAGNOSIS — J45909 Unspecified asthma, uncomplicated: Secondary | ICD-10-CM | POA: Diagnosis not present

## 2020-03-31 DIAGNOSIS — M48061 Spinal stenosis, lumbar region without neurogenic claudication: Secondary | ICD-10-CM | POA: Diagnosis not present

## 2020-03-31 DIAGNOSIS — Z4789 Encounter for other orthopedic aftercare: Secondary | ICD-10-CM | POA: Diagnosis not present

## 2020-04-01 ENCOUNTER — Ambulatory Visit: Payer: Self-pay | Admitting: Allergy & Immunology

## 2020-04-01 NOTE — Discharge Summary (Signed)
Patient ID: OMAH DELAHOUSSAYE MRN: [id number] DOB/AGE: [birth year] 66 y.o.  Admit date: 03/08/2020 Discharge date: 03/11/2020 Admission Diagnoses:  Principal Problem:   Pseudarthrosis after fusion or arthrodesis Active Problems:   Spinal stenosis, lumbar region, with neurogenic claudication   Degenerative spondylolisthesis   Painful orthopaedic hardware [facility])   Fusion of spine of thoracolumbar region   Spondylolysis, lumbar region   Incidental durotomy   Discharge Diagnoses:  Principal Problem:   Pseudarthrosis after fusion or arthrodesis Active Problems:   Spinal stenosis, lumbar region, with neurogenic claudication   Degenerative spondylolisthesis   Painful orthopaedic hardware (HCC)   Fusion of spine of thoracolumbar region   Spondylolysis, lumbar region   Incidental durotomy  status post Procedure(s): REMOVAL OF SPINAL CORD STIMULATOR AND BATTERY, REMOVAL OF HARDWARE L1 TO L3, TRANSFORAMINAL LUMBAR INTERBODY FUSION LEFT L1-2 AND T12-L1, POSTERIOR FUSION INSTRUMENTATION T10, T11, T12, L1 AND L2, LOCAL BONE GRAFT, ALLOGRAFT BONE GRAFT, VIVIGEN  Past Medical History:  Diagnosis Date  . Arthritis   . Asthma   . Cancer (HCC)    cervical , skin  . Cold    recent  rx  . Fibromyalgia   . GERD (gastroesophageal reflux disease)   . H/O hiatal hernia   . Hypertension   . PONV (postoperative nausea and vomiting)   . Shortness of breath   . Sleep apnea    ' mild " does not use cpap    Surgeries: Procedure(s): REMOVAL OF SPINAL CORD STIMULATOR AND BATTERY, REMOVAL OF HARDWARE L1 TO L3, TRANSFORAMINAL LUMBAR INTERBODY FUSION LEFT L1-2 AND T12-L1, POSTERIOR FUSION INSTRUMENTATION T10, T11, T12, L1 AND L2, LOCAL BONE GRAFT, ALLOGRAFT BONE GRAFT, VIVIGEN on 03/08/2020   Consultants:   Discharged Condition: Improved  Hospital Course: CHARLESTON ALFSON is an 65 y.o. female who was admitted 03/08/2020 for operative treatment of Pseudarthrosis after fusion or arthrodesis.  Patient failed conservative treatments (please see the history and physical for the specifics) and had severe unremitting pain that affects sleep, daily activities and work/hobbies. After pre-op clearance, the patient was taken to the operating room on 03/08/2020 and underwent  Procedure(s): REMOVAL OF SPINAL CORD STIMULATOR AND BATTERY, REMOVAL OF HARDWARE L1 TO L3, TRANSFORAMINAL LUMBAR INTERBODY FUSION LEFT L1-2 AND T12-L1, POSTERIOR FUSION INSTRUMENTATION T10, T11, T12, L1 AND L2, LOCAL BONE GRAFT, ALLOGRAFT BONE GRAFT, VIVIGEN.    Patient was given perioperative antibiotics:  Anti-infectives (From admission, onward)   Start     Dose/Rate Route Frequency Ordered Stop   03/08/20 2030  ceFAZolin (ANCEF) IVPB 2g/100 mL premix        2 g 200 mL/hr over 30 Minutes Intravenous Every 8 hours 03/08/20 1847 03/09/20 0534   03/08/20 0630  ceFAZolin (ANCEF) IVPB 2g/100 mL premix  Status:  Discontinued        2 g 200 mL/hr over 30 Minutes Intravenous On call to O.R. 03/08/20 7048 03/08/20 1833       Patient was given sequential compression devices and early ambulation to prevent DVT.   Patient benefited maximally from hospital stay and there were no complications. At the time of discharge, the patient was urinating/moving their bowels without difficulty, tolerating a regular diet, pain is controlled with oral pain medications and they have been cleared by PT/OT.   Recent vital signs: No data found.   Recent laboratory studies: No results for input(s): WBC, HGB, HCT, PLT, NA, K, CL, CO2, BUN, CREATININE, GLUCOSE, INR, CALCIUM in the last 72 hours.  Invalid input(s): PT, 2  Discharge Medications:   Allergies as of 03/11/2020      Reactions   Cephalosporins Nausea And Vomiting   Codeine Hives, Palpitations   Hydrocodone, Ultram   Doxycycline Nausea And Vomiting   Novocain [procaine] Swelling   Other Nausea And Vomiting   Sensitive to pain medications   Statins Other (See Comments)    myalgia   Sulfa Antibiotics Hives, Nausea And Vomiting   Tricyclic Antidepressants Nausea And Vomiting   Cymbalta and alike meds   Ace Inhibitors Cough   Asa [aspirin] Nausea And Vomiting   Morphine And Related    High blood pressure    Ultram [tramadol] Other (See Comments)   myalgia   Zetia [ezetimibe] Other (See Comments)   Muscle Aches      Medication List    TAKE these medications   acetaminophen 650 MG CR tablet Commonly known as: TYLENOL Take 1,300 mg by mouth every 8 (eight) hours as needed for pain.   albuterol 108 (90 Base) MCG/ACT inhaler Commonly known as: VENTOLIN HFA Inhale 1 puff into the lungs every 6 (six) hours as needed for wheezing or shortness of breath.   celecoxib 200 MG capsule Commonly known as: CELEBREX Take 200 mg by mouth daily.   cetirizine 10 MG tablet Commonly known as: ZYRTEC Take 10 mg by mouth daily.   clonazePAM 0.5 MG tablet Commonly known as: KLONOPIN Take 0.5 mg by mouth at bedtime.   diazepam 2 MG tablet Commonly known as: Valium Take 1 tablet (2 mg total) by mouth every 6 (six) hours as needed for anxiety (pain).   docusate sodium 100 MG capsule Commonly known as: COLACE Take 1 capsule (100 mg total) by mouth 2 (two) times daily.   EPINEPHrine 0.3 mg/0.3 mL Soaj injection Commonly known as: EPI-PEN Inject 0.3 mg into the muscle as needed for anaphylaxis.   estradiol 1 MG tablet Commonly known as: ESTRACE Take 1 mg by mouth daily.   fexofenadine 180 MG tablet Commonly known as: ALLEGRA Take 180 mg by mouth daily.   Magnesium 250 MG Tabs Take 250 mg by mouth daily.   methocarbamol 500 MG tablet Commonly known as: ROBAXIN Take 1 tablet (500 mg total) by mouth every 6 (six) hours as needed for muscle spasms.   Metoprolol-Hydrochlorothiazide 50-12.5 MG Tb24 Take 0.5 tablets by mouth daily.   MOVE FREE JOINT HEALTH ADVANCE PO Take 1 tablet by mouth daily.   omeprazole 40 MG capsule Commonly known as:  PRILOSEC Take 40 mg by mouth in the morning and at bedtime.   pantoprazole 40 MG tablet Commonly known as: PROTONIX Take 40 mg by mouth 2 (two) times daily.   PROBIOTIC DAILY PO Take 1 capsule by mouth daily.       Diagnostic Studies: DG Lumbar Spine Complete  Result Date: 03/08/2020 CLINICAL DATA:  Surgery, elective. Additional history provided: Removal of spinal cord stimulator and battery, removal of hardware L1-L3, transforaminal lumbar interbody fusion left L1-2 and T12-L1, posterior fusion instrumentation T10, T11, T12, L1 and L2, local bone graft, allograft bone graft, vivigen. EXAM: LUMBAR SPINE - COMPLETE 4+ VIEW; DG C-ARM 1-60 MIN COMPARISON:  Scoliosis radiographs 12/24/2019. Thoracic and lumbar CT myelogram 11/03/2019. FINDINGS: PA, lateral and oblique fluoroscopic images of the thoracolumbar spine are submitted, 7 images total. The images demonstrate a posterior spinal fusion construct extending caudally from the T10 level to the sacrum. An interbody device is now present at L1-L2. IMPRESSION: 7 intraoperative fluoroscopic images of the thoracolumbar spine, as described. Electronically  Signed   By: Kellie Simmering DO   On: 03/08/2020 16:36   DG Abd 1 View  Result Date: 03/09/2020 CLINICAL DATA:  Nausea and vomiting EXAM: ABDOMEN - 1 VIEW COMPARISON:  12/23/2019 FINDINGS: Scattered large and small bowel gas is noted. Postsurgical changes are noted in the thoracolumbar spine with surgical drain in place. No obstructive changes are seen. No free air is noted. No acute bony abnormality is seen. IMPRESSION: Postsurgical changes in the thoracolumbar spine. Scattered bowel gas without obstructive change. Electronically Signed   By: Inez Catalina M.D.   On: 03/09/2020 09:31   DG C-Arm 1-60 Min  Result Date: 03/08/2020 CLINICAL DATA:  Surgery, elective. Additional history provided: Removal of spinal cord stimulator and battery, removal of hardware L1-L3, transforaminal lumbar interbody  fusion left L1-2 and T12-L1, posterior fusion instrumentation T10, T11, T12, L1 and L2, local bone graft, allograft bone graft, vivigen. EXAM: LUMBAR SPINE - COMPLETE 4+ VIEW; DG C-ARM 1-60 MIN COMPARISON:  Scoliosis radiographs 12/24/2019. Thoracic and lumbar CT myelogram 11/03/2019. FINDINGS: PA, lateral and oblique fluoroscopic images of the thoracolumbar spine are submitted, 7 images total. The images demonstrate a posterior spinal fusion construct extending caudally from the T10 level to the sacrum. An interbody device is now present at L1-L2. IMPRESSION: 7 intraoperative fluoroscopic images of the thoracolumbar spine, as described. Electronically Signed   By: Kellie Simmering DO   On: 03/08/2020 16:36    Discharge Instructions    Call MD / Call 911   Complete by: As directed    If you experience chest pain or shortness of breath, CALL 911 and be transported to the hospital emergency room.  If you develope a fever above 101 F, pus (white drainage) or increased drainage or redness at the wound, or calf pain, call your surgeon's office.   Constipation Prevention   Complete by: As directed    Drink plenty of fluids.  Prune juice may be helpful.  You may use a stool softener, such as Colace (over the counter) 100 mg twice a day.  Use MiraLax (over the counter) for constipation as needed.   Diet - low sodium heart healthy   Complete by: As directed    Discharge instructions   Complete by: As directed    Call if there is increasing drainage, fever greater than 101.5, severe head aches, and worsening nausea or light sensitivity. If shortness of breath, bloody cough or chest tightness or pain go to an emergency room. No lifting greater than 10 lbs. Avoid bending, stooping and twisting. Use brace when sitting and out of bed even to go to bathroom. Walk in house for first 2 weeks then may start to get out slowly increasing distances up to one mile by 4-6 weeks post op. After 5 days may shower and change  dressing following bathing with shower.When bathing remove the brace shower and replace brace before getting out of the shower. If drainage, keep dry dressing and do not bathe the incision, use an moisture impervious dressing. Please call and return for scheduled follow up appointment 2 weeks from the time of surgery.   Driving restrictions   Complete by: As directed    No driving for 3 weeks   Increase activity slowly as tolerated   Complete by: As directed    Lifting restrictions   Complete by: As directed    No lifting for 8 weeks       Follow-up Information    Jessy Oto, MD In  2 weeks.   Specialty: Orthopedic Surgery Why: For wound re-check Contact information: [place] [address] [phone]        Care, [facility] Follow up.   Specialty: Home Health Services Why: [facility] will be providing you with home health PT/OT.  They will call you in the next 24-48 hours after your discharge home to set up therapy times. Contact information: [address] [place] [address] [phone]               Discharge Plan:  discharge to home  Disposition:     Signed: Benjiman Core  04/01/2020, 10:45 AM

## 2020-04-21 ENCOUNTER — Ambulatory Visit (INDEPENDENT_AMBULATORY_CARE_PROVIDER_SITE_OTHER): Payer: Medicare Other

## 2020-04-21 ENCOUNTER — Ambulatory Visit (INDEPENDENT_AMBULATORY_CARE_PROVIDER_SITE_OTHER): Payer: Medicare Other | Admitting: Specialist

## 2020-04-21 ENCOUNTER — Telehealth: Payer: Self-pay | Admitting: Radiology

## 2020-04-21 ENCOUNTER — Other Ambulatory Visit: Payer: Self-pay

## 2020-04-21 ENCOUNTER — Encounter: Payer: Self-pay | Admitting: Specialist

## 2020-04-21 VITALS — BP 117/74 | HR 75 | Ht 67.0 in | Wt 171.0 lb

## 2020-04-21 DIAGNOSIS — Z981 Arthrodesis status: Secondary | ICD-10-CM

## 2020-04-21 NOTE — Progress Notes (Signed)
Post-Op Visit Note   Patient: [name]           Date of Birth: 29-Nov-1955           MRN: [id number] Visit Date: 04/21/2020 PCP: Caryl Bis, MD   Assessment & Plan:  Chief Complaint:  Chief Complaint  Patient presents with  . Lower Back - Routine Post Op   Visit Diagnoses:  1. Status post lumbar spinal fusion   65 year old female with history of extension of the lumbar fusion to the T10 level with pedicle screws and rods. She has some residual numbness and tingling and burning sensations into the feet. She does not have diabetes. HgbA1c  Was 5.1. Husband has diabetes.  Right hip ROM hip is decreased in flexion and external rotation. She has been released from PT in [place].  LE motor is intact.  SLR is normal  Decreased right hip flexion by 20-30 degrees.   Plan:If shortness of breath, bloody cough or chest tightness or pain go to an emergency room. No lifting greater than 10 lbs. Avoid bending, stooping and twisting. Use brace when sitting and out of bed even to go to bathroom. Walk slowly increasing distances up to one mile by 4-6 weeks post op. May shower and change dressing following bathing with shower.When bathing remove the brace shower and replace brace before getting out of the shower.     Follow-Up Instructions: No follow-ups on file.   Orders:  Orders Placed This Encounter  Procedures  . XR Lumbar Spine 2-3 Views   No orders of the defined types were placed in this encounter.   Imaging: No results found.  PMFS History: Patient Active Problem List   Diagnosis Date Noted  . Spondylolysis, lumbar region 03/09/2020    Priority: High    Class: Chronic  . Incidental durotomy 03/09/2020    Priority: High    Class: Acute  . Pseudarthrosis after fusion or arthrodesis 08/09/2014    Priority: High    Class: Chronic  . Painful orthopaedic hardware ([place]) 08/09/2014    Priority: High    Class: Chronic  . Spinal stenosis, lumbar region,  with neurogenic claudication 04/07/2013    Priority: High    Class: Chronic  . Degenerative spondylolisthesis 04/07/2013    Priority: High    Class: Chronic  . Ileus, postoperative ([place]) 08/13/2014    Priority: Medium    Class: Acute  . Fusion of spine of thoracolumbar region 03/08/2020  . Seasonal and perennial allergic rhinitis 02/18/2019  . Vulvar cysts 04/26/2014  . Spinal stenosis of lumbar region with neurogenic claudication 04/07/2013   Past Medical History:  Diagnosis Date  . Arthritis   . Asthma   . Cancer (HCC)    cervical , skin  . Cold    recent  rx  . Fibromyalgia   . GERD (gastroesophageal reflux disease)   . H/O hiatal hernia   . Hypertension   . PONV (postoperative nausea and vomiting)   . Shortness of breath   . Sleep apnea    ' mild " does not use cpap    Family History  Problem Relation Age of Onset  . Allergic rhinitis Neg Hx   . Angioedema Neg Hx   . Asthma Neg Hx   . Atopy Neg Hx   . Eczema Neg Hx   . Immunodeficiency Neg Hx   . Urticaria Neg Hx     Past Surgical History:  Procedure Laterality Date  .  ABDOMINAL HYSTERECTOMY  04  . APPENDECTOMY    . BACK SURGERY     x3  . CERVICAL CONIZATION W/BX    . CERVICAL DISC SURGERY  06  . CHOLECYSTECTOMY  03  . LUMBAR FUSION  04/07/2013   L 2 L3 L4    with rod     DR NITKA  . TONSILLECTOMY     Social History   Occupational History  . Not on file  Tobacco Use  . Smoking status: Never Smoker  . Smokeless tobacco: Never Used  Vaping Use  . Vaping Use: Never used  Substance and Sexual Activity  . Alcohol use: No  . Drug use: No  . Sexual activity: Yes    Birth control/protection: Surgical

## 2020-04-21 NOTE — Patient Instructions (Signed)
Plan:If shortness of breath, bloody cough or chest tightness or pain go to an emergency room. No lifting greater than 10 lbs. Avoid bending, stooping and twisting. Use brace when sitting and out of bed even to go to bathroom. Walk slowly increasing distances up to one mile by 4-6 weeks post op. May shower and change dressing following bathing with shower.When bathing remove the brace shower and replace brace before getting out of the shower.

## 2020-04-21 NOTE — Telephone Encounter (Signed)
CD of x-rays made and placed in the mail 01/27.

## 2020-04-27 DIAGNOSIS — K219 Gastro-esophageal reflux disease without esophagitis: Secondary | ICD-10-CM | POA: Diagnosis not present

## 2020-04-27 DIAGNOSIS — N183 Chronic kidney disease, stage 3 unspecified: Secondary | ICD-10-CM | POA: Diagnosis not present

## 2020-04-27 DIAGNOSIS — E782 Mixed hyperlipidemia: Secondary | ICD-10-CM | POA: Diagnosis not present

## 2020-04-27 DIAGNOSIS — I10 Essential (primary) hypertension: Secondary | ICD-10-CM | POA: Diagnosis not present

## 2020-04-27 DIAGNOSIS — R946 Abnormal results of thyroid function studies: Secondary | ICD-10-CM | POA: Diagnosis not present

## 2020-04-27 DIAGNOSIS — E7849 Other hyperlipidemia: Secondary | ICD-10-CM | POA: Diagnosis not present

## 2020-04-29 DIAGNOSIS — K2100 Gastro-esophageal reflux disease with esophagitis, without bleeding: Secondary | ICD-10-CM | POA: Diagnosis not present

## 2020-04-29 DIAGNOSIS — G43909 Migraine, unspecified, not intractable, without status migrainosus: Secondary | ICD-10-CM | POA: Diagnosis not present

## 2020-04-29 DIAGNOSIS — E7849 Other hyperlipidemia: Secondary | ICD-10-CM | POA: Diagnosis not present

## 2020-04-29 DIAGNOSIS — I10 Essential (primary) hypertension: Secondary | ICD-10-CM | POA: Diagnosis not present

## 2020-04-29 DIAGNOSIS — J329 Chronic sinusitis, unspecified: Secondary | ICD-10-CM | POA: Diagnosis not present

## 2020-04-29 DIAGNOSIS — J301 Allergic rhinitis due to pollen: Secondary | ICD-10-CM | POA: Diagnosis not present

## 2020-04-29 DIAGNOSIS — E876 Hypokalemia: Secondary | ICD-10-CM | POA: Diagnosis not present

## 2020-05-23 DIAGNOSIS — Z1231 Encounter for screening mammogram for malignant neoplasm of breast: Secondary | ICD-10-CM | POA: Diagnosis not present

## 2020-05-24 DIAGNOSIS — M4005 Postural kyphosis, thoracolumbar region: Secondary | ICD-10-CM

## 2020-05-24 DIAGNOSIS — M5134 Other intervertebral disc degeneration, thoracic region: Secondary | ICD-10-CM

## 2020-05-24 DIAGNOSIS — M40295 Other kyphosis, thoracolumbar region: Secondary | ICD-10-CM

## 2020-05-26 ENCOUNTER — Encounter (HOSPITAL_COMMUNITY): Payer: Self-pay | Admitting: Specialist

## 2020-05-27 ENCOUNTER — Ambulatory Visit (INDEPENDENT_AMBULATORY_CARE_PROVIDER_SITE_OTHER): Payer: Medicare Other | Admitting: Specialist

## 2020-05-27 ENCOUNTER — Encounter: Payer: Self-pay | Admitting: Specialist

## 2020-05-27 ENCOUNTER — Other Ambulatory Visit: Payer: Self-pay

## 2020-05-27 ENCOUNTER — Ambulatory Visit (INDEPENDENT_AMBULATORY_CARE_PROVIDER_SITE_OTHER): Payer: Medicare Other

## 2020-05-27 VITALS — BP 122/77 | HR 63 | Ht 67.0 in | Wt 171.0 lb

## 2020-05-27 DIAGNOSIS — Z981 Arthrodesis status: Secondary | ICD-10-CM

## 2020-05-27 NOTE — Progress Notes (Signed)
Post-Op Visit Note   Patient: [name]           Date of Birth: 1955-09-21           MRN: [id number] Visit Date: 05/27/2020 PCP: Caryl Bis, MD   Assessment & Plan:11 1/2 weeks post op extension of lumbar fusion to T10  Chief Complaint:  Chief Complaint  Patient presents with  . Lower Back - Follow-up  Taking some tylenol and rarely alleve. Has Renal Disease Stage 3. Visit Diagnoses:  1. Status post lumbar spinal fusion   Incision is healed, no erythrema or open wound, no fluctuance. Motor is intact though a little off balance. Xrays.   Plan: Avoid frequent bending and stooping  No lifting greater than 10 lbs. May use ice or moist heat for pain. Weight loss is of benefit. Best medication for lumbar disc disease is arthritis medications like motrin, celebrex and naprosyn. Exercise is important to improve your indurance and does allow people to function better inspite of back pain.    Follow-Up Instructions: No follow-ups on file.   Orders:  Orders Placed This Encounter  Procedures  . XR Lumbar Spine 2-3 Views   No orders of the defined types were placed in this encounter.   Imaging: No results found.  PMFS History: Patient Active Problem List   Diagnosis Date Noted  . Spondylolysis, lumbar region 03/09/2020    Priority: High    Class: Chronic  . Incidental durotomy 03/09/2020    Priority: High    Class: Acute  . Pseudarthrosis after fusion or arthrodesis 08/09/2014    Priority: High    Class: Chronic  . Painful orthopaedic hardware ([employer]) 08/09/2014    Priority: High    Class: Chronic  . Spinal stenosis, lumbar region, with neurogenic claudication 04/07/2013    Priority: High    Class: Chronic  . Degenerative spondylolisthesis 04/07/2013    Priority: High    Class: Chronic  . Ileus, postoperative ([place]) 08/13/2014    Priority: Medium    Class: Acute  . Other kyphosis, thoracolumbar region   . DDD (degenerative disc disease), thoracic    . Postural kyphosis of thoracolumbar region   . Fusion of spine of thoracolumbar region 03/08/2020  . Seasonal and perennial allergic rhinitis 02/18/2019  . Vulvar cysts 04/26/2014  . Spinal stenosis of lumbar region with neurogenic claudication 04/07/2013   Past Medical History:  Diagnosis Date  . Arthritis   . Asthma   . Cancer (HCC)    cervical , skin  . Cold    recent  rx  . Fibromyalgia   . GERD (gastroesophageal reflux disease)   . H/O hiatal hernia   . Hypertension   . PONV (postoperative nausea and vomiting)   . Shortness of breath   . Sleep apnea    ' mild " does not use cpap    Family History  Problem Relation Age of Onset  . Allergic rhinitis Neg Hx   . Angioedema Neg Hx   . Asthma Neg Hx   . Atopy Neg Hx   . Eczema Neg Hx   . Immunodeficiency Neg Hx   . Urticaria Neg Hx     Past Surgical History:  Procedure Laterality Date  . ABDOMINAL HYSTERECTOMY  04  . APPENDECTOMY    . BACK SURGERY     x3  . CERVICAL CONIZATION W/BX    . CERVICAL DISC SURGERY  06  . CHOLECYSTECTOMY  03  . LUMBAR FUSION  04/07/2013   L 2 L3 L4    with rod     DR Anhar Mcdermott  . TONSILLECTOMY     Social History   Occupational History  . Not on file  Tobacco Use  . Smoking status: Never Smoker  . Smokeless tobacco: Never Used  Vaping Use  . Vaping Use: Never used  Substance and Sexual Activity  . Alcohol use: No  . Drug use: No  . Sexual activity: Yes    Birth control/protection: Surgical

## 2020-07-13 DIAGNOSIS — M797 Fibromyalgia: Secondary | ICD-10-CM | POA: Diagnosis not present

## 2020-07-13 DIAGNOSIS — J329 Chronic sinusitis, unspecified: Secondary | ICD-10-CM | POA: Diagnosis not present

## 2020-08-05 DIAGNOSIS — R5383 Other fatigue: Secondary | ICD-10-CM | POA: Diagnosis not present

## 2020-08-05 DIAGNOSIS — E7849 Other hyperlipidemia: Secondary | ICD-10-CM | POA: Diagnosis not present

## 2020-08-05 DIAGNOSIS — E876 Hypokalemia: Secondary | ICD-10-CM | POA: Diagnosis not present

## 2020-08-05 DIAGNOSIS — E782 Mixed hyperlipidemia: Secondary | ICD-10-CM | POA: Diagnosis not present

## 2020-08-05 DIAGNOSIS — I10 Essential (primary) hypertension: Secondary | ICD-10-CM | POA: Diagnosis not present

## 2020-08-05 DIAGNOSIS — N183 Chronic kidney disease, stage 3 unspecified: Secondary | ICD-10-CM | POA: Diagnosis not present

## 2020-08-11 DIAGNOSIS — G43909 Migraine, unspecified, not intractable, without status migrainosus: Secondary | ICD-10-CM | POA: Diagnosis not present

## 2020-08-11 DIAGNOSIS — J329 Chronic sinusitis, unspecified: Secondary | ICD-10-CM | POA: Diagnosis not present

## 2020-08-11 DIAGNOSIS — M797 Fibromyalgia: Secondary | ICD-10-CM | POA: Diagnosis not present

## 2020-08-11 DIAGNOSIS — E876 Hypokalemia: Secondary | ICD-10-CM | POA: Diagnosis not present

## 2020-08-11 DIAGNOSIS — R4582 Worries: Secondary | ICD-10-CM | POA: Diagnosis not present

## 2020-08-11 DIAGNOSIS — I10 Essential (primary) hypertension: Secondary | ICD-10-CM | POA: Diagnosis not present

## 2020-08-11 DIAGNOSIS — E7849 Other hyperlipidemia: Secondary | ICD-10-CM | POA: Diagnosis not present

## 2020-08-31 ENCOUNTER — Encounter: Payer: Self-pay | Admitting: Specialist

## 2020-08-31 ENCOUNTER — Ambulatory Visit: Payer: Medicare Other | Admitting: Specialist

## 2020-08-31 ENCOUNTER — Ambulatory Visit: Payer: Self-pay

## 2020-08-31 VITALS — BP 138/90 | HR 99 | Ht 66.0 in | Wt 168.0 lb

## 2020-08-31 DIAGNOSIS — G5753 Tarsal tunnel syndrome, bilateral lower limbs: Secondary | ICD-10-CM

## 2020-08-31 DIAGNOSIS — Z981 Arthrodesis status: Secondary | ICD-10-CM | POA: Diagnosis not present

## 2020-08-31 NOTE — Progress Notes (Signed)
Office Visit Note   Patient: [name]           Date of Birth: 1955/06/16           MRN: [id number] Visit Date: 08/31/2020              Requested by: Caryl Bis, MD [place],  [place] [id number] PCP: Caryl Bis, MD   Assessment & Plan: Visit Diagnoses:  1. Status post lumbar spinal fusion   2. Tarsal tunnel syndrome, bilateral lower limbs     Plan: Avoid frequent bending and stooping  No lifting greater than 10 lbs. May use ice or moist heat for pain. Weight loss is of benefit. Best medication for lumbar disc disease is arthritis medications like motrin, celebrex and naprosyn. Exercise is important to improve your indurance and does allow people to function better inspite of back pain.  Follow-Up Instructions: Return in about 4 weeks (around 09/28/2020).   Orders:  Orders Placed This Encounter  Procedures  . XR Lumbar Spine 2-3 Views  . Ambulatory referral to Physical Medicine Rehab   No orders of the defined types were placed in this encounter.     Procedures: No procedures performed   Clinical Data: No additional findings.   Subjective: Chief Complaint  Patient presents with  . Lower Back - Follow-up    HPI  Review of Systems   Objective: Vital Signs: There were no vitals taken for this visit.  Physical Exam  Ortho Exam  Specialty Comments:  No specialty comments available.  Imaging: XR Lumbar Spine 2-3 Views  Result Date: 08/31/2020 AP and lateral flexion and extension radiographs demonstrate about 2-3 movement with flexion. No acute findings the areas of interbody fusion.    PMFS History: Patient Active Problem List   Diagnosis Date Noted  . Spondylolysis, lumbar region 03/09/2020    Priority: High    Class: Chronic  . Incidental durotomy 03/09/2020    Priority: High    Class: Acute  . Pseudarthrosis after fusion or arthrodesis 08/09/2014    Priority: High    Class: Chronic  . Painful orthopaedic hardware  ([place]) 08/09/2014    Priority: High    Class: Chronic  . Spinal stenosis, lumbar region, with neurogenic claudication 04/07/2013    Priority: High    Class: Chronic  . Degenerative spondylolisthesis 04/07/2013    Priority: High    Class: Chronic  . Ileus, postoperative ([place]) 08/13/2014    Priority: Medium    Class: Acute  . Other kyphosis, thoracolumbar region   . DDD (degenerative disc disease), thoracic   . Postural kyphosis of thoracolumbar region   . Fusion of spine of thoracolumbar region 03/08/2020  . Seasonal and perennial allergic rhinitis 02/18/2019  . Vulvar cysts 04/26/2014  . Spinal stenosis of lumbar region with neurogenic claudication 04/07/2013   Past Medical History:  Diagnosis Date  . Arthritis   . Asthma   . Cancer (HCC)    cervical , skin  . Cold    recent  rx  . Fibromyalgia   . GERD (gastroesophageal reflux disease)   . H/O hiatal hernia   . Hypertension   . PONV (postoperative nausea and vomiting)   . Shortness of breath   . Sleep apnea    ' mild " does not use cpap    Family History  Problem Relation Age of Onset  . Allergic rhinitis Neg Hx   . Angioedema Neg Hx   . Asthma  Neg Hx   . Atopy Neg Hx   . Eczema Neg Hx   . Immunodeficiency Neg Hx   . Urticaria Neg Hx     Past Surgical History:  Procedure Laterality Date  . ABDOMINAL HYSTERECTOMY  04  . APPENDECTOMY    . BACK SURGERY     x3  . CERVICAL CONIZATION W/BX    . CERVICAL DISC SURGERY  06  . CHOLECYSTECTOMY  03  . LUMBAR FUSION  04/07/2013   L 2 L3 L4    with rod     DR Dyon Rotert  . TONSILLECTOMY     Social History   Occupational History  . Not on file  Tobacco Use  . Smoking status: Never Smoker  . Smokeless tobacco: Never Used  Vaping Use  . Vaping Use: Never used  Substance and Sexual Activity  . Alcohol use: No  . Drug use: No  . Sexual activity: Yes    Birth control/protection: Surgical

## 2020-08-31 NOTE — Patient Instructions (Signed)
Avoid frequent bending and stooping  No lifting greater than 10 lbs. May use ice or moist heat for pain. Weight loss is of benefit. Best medication for lumbar disc disease is arthritis medications like motrin, celebrex and naprosyn. Exercise is important to improve your indurance and does allow people to function better inspite of back pain.   

## 2020-09-16 ENCOUNTER — Telehealth: Payer: Self-pay | Admitting: Physical Medicine and Rehabilitation

## 2020-09-16 NOTE — Telephone Encounter (Signed)
I believe that she has tarsal tunnel syndrome and I would like EMG and NCV of feet done.

## 2020-09-16 NOTE — Telephone Encounter (Signed)
Pt states a referral was supposed to be put in for her to see Dr.Newton but no one ever called her so pt would like a CB to set that appt up.   [phone]

## 2020-09-19 ENCOUNTER — Other Ambulatory Visit: Payer: Self-pay | Admitting: Radiology

## 2020-09-19 DIAGNOSIS — G5753 Tarsal tunnel syndrome, bilateral lower limbs: Secondary | ICD-10-CM

## 2020-09-19 NOTE — Telephone Encounter (Signed)
She is pending appt at [facility] due to they will do the NCS/EMG only w/o doing an additional office visit

## 2020-09-19 NOTE — Telephone Encounter (Signed)
Pt needs to be referral to Neurosurgery.

## 2020-09-30 ENCOUNTER — Ambulatory Visit: Payer: Medicare Other | Admitting: Specialist

## 2020-10-03 ENCOUNTER — Telehealth: Payer: Self-pay | Admitting: Specialist

## 2020-10-03 NOTE — Telephone Encounter (Signed)
Pt called requesting a call back concerning a nerve study test. Pt has question and would like for Christy to give her a call at [phone].

## 2020-10-03 NOTE — Telephone Encounter (Signed)
I called and spoke with patient, she states that she was concerned due to the fact she was supposed to have it with Dr. Ernestina Patches, I advised that due to the test that she needed Dr. Ernestina Patches felt neurology would be a better option for her. So the referral was changed. So she is going to call them back tomorrow to schedule the  test

## 2020-10-04 ENCOUNTER — Telehealth: Payer: Self-pay | Admitting: Specialist

## 2020-10-04 NOTE — Telephone Encounter (Signed)
Pt called and states Neuro has not received her referral and she needs to you to fax it to this number  FAX # [phone]

## 2020-10-05 NOTE — Telephone Encounter (Signed)
Is this something that you guys change if Dr. Ernestina Patches recommends somewhere else, or is the referral then sent by Sabrina?  I do not have access to the referrals so I am just trying to get the message to the correct person. Thanks.

## 2020-10-05 NOTE — Telephone Encounter (Signed)
Please see below re referral from Dr. Louanne Skye to neurology.

## 2020-10-05 NOTE — Telephone Encounter (Signed)
I changed the referral to neurology and sent to [place] as requested

## 2020-10-25 DIAGNOSIS — Z23 Encounter for immunization: Secondary | ICD-10-CM | POA: Diagnosis not present

## 2020-11-01 ENCOUNTER — Other Ambulatory Visit: Payer: Self-pay | Admitting: Diagnostic Neuroimaging

## 2020-11-01 ENCOUNTER — Ambulatory Visit: Payer: Medicare Other | Admitting: Diagnostic Neuroimaging

## 2020-11-01 ENCOUNTER — Encounter: Payer: Self-pay | Admitting: Diagnostic Neuroimaging

## 2020-11-01 ENCOUNTER — Other Ambulatory Visit: Payer: Self-pay

## 2020-11-01 VITALS — BP 120/76 | HR 64 | Ht 67.0 in | Wt 167.2 lb

## 2020-11-01 DIAGNOSIS — E539 Vitamin B deficiency, unspecified: Secondary | ICD-10-CM | POA: Diagnosis not present

## 2020-11-01 NOTE — Patient Instructions (Signed)
-   check labs and EMG

## 2020-11-01 NOTE — Progress Notes (Signed)
[facility]  PATIENT: [name] DOB: 1955-12-26  REFERRING CLINICIAN: Caryl Bis, MD HISTORY FROM: PATIENT  REASON FOR VISIT: NEW CONSULT    HISTORICAL  CHIEF COMPLAINT:  Chief Complaint  Patient presents with   Tarsal tunnel syndrome    Rm 7 New Pt  fiancee- Leslie  "feet go numb, burn,sting, feels like a sock on bottom of foot, gotten worse since back surgery last Dec"     HISTORY OF PRESENT ILLNESS:   65 year old female here for evaluation of burning feet.  Symptoms started around 2016.  She has had 4 lower back surgeries including most recently in December 2021.  Symptoms have been progressing over the past 6 years but significantly worsened after her last surgery in December 2021.  She has followed up with orthopedic surgery.  Question of tarsal tunnel syndrome was raised.  EMG nerve conduction study has been requested.   REVIEW OF SYSTEMS: Full 14 system review of systems performed and negative with exception of: As per HPI.  ALLERGIES: Allergies  Allergen Reactions   Cephalosporins Nausea And Vomiting   Codeine Hives and Palpitations    Hydrocodone, Ultram   Doxycycline Nausea And Vomiting   Novocain [Procaine] Swelling   Other Nausea And Vomiting    Sensitive to pain medications   Statins Other (See Comments)    myalgia   Sulfa Antibiotics Hives and Nausea And Vomiting   Tricyclic Antidepressants Nausea And Vomiting    Cymbalta and alike meds   Ace Inhibitors Cough   Asa [Aspirin] Nausea And Vomiting   Morphine And Related     High blood pressure    Ultram [Tramadol] Other (See Comments)    myalgia   Zetia [Ezetimibe] Other (See Comments)    Muscle Aches    HOME MEDICATIONS: Outpatient Medications Prior to Visit  Medication Sig Dispense Refill   acetaminophen (TYLENOL) 650 MG CR tablet Take 1,300 mg by mouth every 8 (eight) hours as needed for pain.     albuterol (VENTOLIN HFA) 108 (90 Base) MCG/ACT inhaler Inhale 1 puff  into the lungs every 6 (six) hours as needed for wheezing or shortness of breath.     celecoxib (CELEBREX) 200 MG capsule Take 200 mg by mouth daily.     cetirizine (ZYRTEC) 10 MG tablet Take 10 mg by mouth daily.     clonazePAM (KLONOPIN) 0.5 MG tablet Take 0.5 mg by mouth at bedtime.     estradiol (ESTRACE) 1 MG tablet Take 1 mg by mouth daily.     fexofenadine (ALLEGRA) 180 MG tablet Take 180 mg by mouth daily.     Metoprolol-Hydrochlorothiazide 50-12.5 MG TB24 Take 0.5 tablets by mouth daily.      omeprazole (PRILOSEC) 40 MG capsule Take 40 mg by mouth in the morning and at bedtime.      pantoprazole (PROTONIX) 40 MG tablet Take 40 mg by mouth 2 (two) times daily.     Probiotic Product (PROBIOTIC DAILY PO) Take 1 capsule by mouth daily.     EPINEPHrine 0.3 mg/0.3 mL IJ SOAJ injection Inject 0.3 mg into the muscle as needed for anaphylaxis.  (Patient not taking: Reported on 11/01/2020)     docusate sodium (COLACE) 100 MG capsule Take 1 capsule (100 mg total) by mouth 2 (two) times daily. 10 capsule 0   Glucos-Chond-Hyal Ac-Ca Fructo (MOVE FREE JOINT HEALTH ADVANCE PO) Take 1 tablet by mouth daily.     Magnesium 250 MG TABS Take 250 mg by mouth  daily.     No facility-administered medications prior to visit.    PAST MEDICAL HISTORY: Past Medical History:  Diagnosis Date   Arthritis    Asthma    Cancer ([place])    cervical , skin   Cold    recent  rx   Fibromyalgia    GERD (gastroesophageal reflux disease)    H/O hiatal hernia    Hypertension    PONV (postoperative nausea and vomiting)    Shortness of breath    Sleep apnea    ' mild " does not use cpap   Tarsal tunnel syndrome of both lower extremities     PAST SURGICAL HISTORY: Past Surgical History:  Procedure Laterality Date   ABDOMINAL HYSTERECTOMY  2004   APPENDECTOMY     BACK SURGERY     x3   BACK SURGERY  02/2020   rod placed in back   CERVICAL CONIZATION W/BX     [place]  2006   CHOLECYSTECTOMY  2003    LUMBAR FUSION  04/07/2013   L 2 L3 L4    with rod     DR NITKA   TONSILLECTOMY      FAMILY HISTORY: Family History  Problem Relation Age of Onset   Allergic rhinitis Neg Hx    Angioedema Neg Hx    Asthma Neg Hx    Atopy Neg Hx    Eczema Neg Hx    Immunodeficiency Neg Hx    Urticaria Neg Hx     SOCIAL HISTORY: Social History   Socioeconomic History   Marital status: Single    Spouse name: Not on file   Number of children: 1   Years of education: Not on file   Highest education level: 11th grade  Occupational History   Not on file  Tobacco Use   Smoking status: Never   Smokeless tobacco: Never  Vaping Use   Vaping Use: Never used  Substance and Sexual Activity   Alcohol use: No   Drug use: No   Sexual activity: Yes    Birth control/protection: Surgical  Other Topics Concern   Not on file  Social History Narrative   Not on file   Social Determinants of Health   Financial Resource Strain: Not on file  Food Insecurity: Not on file  Transportation Needs: Not on file  Physical Activity: Not on file  Stress: Not on file  Social Connections: Not on file  Intimate Partner Violence: Not on file     PHYSICAL EXAM  GENERAL EXAM/CONSTITUTIONAL: Vitals:  Vitals:   11/01/20 1338  BP: 120/76  Pulse: 64  Weight: 167 lb 3.2 oz (75.8 kg)  Height: '5\' 7"'  (1.702 m)   Body mass index is 26.19 kg/m. Wt Readings from Last 3 Encounters:  11/01/20 167 lb 3.2 oz (75.8 kg)  08/31/20 168 lb (76.2 kg)  05/27/20 171 lb (77.6 kg)   Patient is in no distress; well developed, nourished and groomed; neck is supple  CARDIOVASCULAR: Examination of carotid arteries is normal; no carotid bruits Regular rate and rhythm, no murmurs Examination of peripheral vascular system by observation and palpation is normal  EYES: Ophthalmoscopic exam of optic discs and posterior segments is normal; no papilledema or hemorrhages No results found.  MUSCULOSKELETAL: Gait, strength,  tone, movements noted in Neurologic exam below  NEUROLOGIC: MENTAL STATUS:  No flowsheet data found. awake, alert, oriented to person, place and time recent and remote memory intact normal attention and concentration language fluent, comprehension  intact, naming intact fund of knowledge appropriate  CRANIAL NERVE:  2nd - no papilledema on fundoscopic exam 2nd, 3rd, 4th, 6th - pupils equal and reactive to light, visual fields full to confrontation, extraocular muscles intact, no nystagmus 5th - facial sensation symmetric 7th - facial strength symmetric 8th - hearing intact 9th - palate elevates symmetrically, uvula midline 11th - shoulder shrug symmetric 12th - tongue protrusion midline  MOTOR:  normal bulk and tone, full strength in the BUE, BLE  SENSORY:  normal and symmetric to light touch, pinprick, temperature, vibration; EXCEPT DECR IN FEET TO PP, TEMP, VIB  COORDINATION:  finger-nose-finger, fine finger movements normal  REFLEXES:  deep tendon reflexes TRACE and symmetric  GAIT/STATION:  narrow based gait; antalgic, unsteady gait     DIAGNOSTIC DATA (LABS, IMAGING, TESTING) - I reviewed patient records, labs, notes, testing and imaging myself where available.  Lab Results  Component Value Date   WBC 15.8 (H) 03/10/2020   HGB 10.2 (L) 03/10/2020   HCT 29.5 (L) 03/10/2020   MCV 89.1 03/10/2020   PLT 257 03/10/2020      Component Value Date/Time   NA 133 (L) 03/11/2020 0107   K 3.6 03/11/2020 0107   CL 98 03/11/2020 0107   CO2 27 03/11/2020 0107   GLUCOSE 152 (H) 03/11/2020 0107   BUN 5 (L) 03/11/2020 0107   CREATININE 0.67 03/11/2020 0107   CALCIUM 8.2 (L) 03/11/2020 0107   PROT 6.1 (L) 08/04/2014 1120   ALBUMIN 3.8 08/04/2014 1120   AST 14 (L) 08/04/2014 1120   ALT 11 (L) 08/04/2014 1120   ALKPHOS 64 08/04/2014 1120   BILITOT 0.4 08/04/2014 1120   GFRNONAA >60 03/11/2020 0107   GFRAA >60 08/11/2014 0845   No results found for: CHOL, HDL,  LDLCALC, LDLDIRECT, TRIG, CHOLHDL Lab Results  Component Value Date   HGBA1C 5.9 (H) 08/12/2014   No results found for: VITAMINB12 No results found for: TSH   11/03/2019 CT myelogram thoracic and lumbar spine No significant thoracic region pathology. Dorsal neuro stimulators in place.   Solid union from L2 to the sacrum. Previous discectomy and posterior decompression throughout that region. Sufficient patency of the canal and foramina.   L1-2 shows profound degenerative spondylosis with loss of disc height, endplate cystic change, endplate insufficiency fractures and bone sclerosis. Bulging of the disc. Abnormal motion at this level with flexion extension. Laminar hooks at L1 and L2, obviously without solid union.  04/21/2020 x-ray lumbar spine AP and lateral lumbar spine shows pedicle screws and rods from L5 to T11,  the T10 level is not seen well. Anterior interbody fusions at L1-2, and  L2-3 with restoration of the normal sagital alignment..There is no  loosening of the hardware seen recommend that she have repeat xrays at  next visit to include the T10 level.    ASSESSMENT AND PLAN  65 y.o. year old female here with numbness and burning in feet since 2016, worsened after surgery in December 2021.  May represent lumbar radiculopathies versus peripheral neuropathies.  We will proceed with further work-up.  Dx:  1. Burning feet syndrome      PLAN:  Orders Placed This Encounter  Procedures   CBC with diff   CMP   Vitamin B12   A1c   TSH   SPEP with IFE   ANA w/Reflex   SSA, SSB   ESR   CRP   NCV with EMG(electromyography)   Return for for NCV/EMG.    Hong Moring R.  Letia Guidry, MD 04/02/4079, 4:48 PM Certified in Neurology, Neurophysiology and Neuroimaging  [facility] [address], [place], [place] [address] [phone]

## 2020-11-03 DIAGNOSIS — K529 Noninfective gastroenteritis and colitis, unspecified: Secondary | ICD-10-CM | POA: Diagnosis not present

## 2020-11-03 DIAGNOSIS — Z7689 Persons encountering health services in other specified circumstances: Secondary | ICD-10-CM | POA: Diagnosis not present

## 2020-11-03 DIAGNOSIS — R300 Dysuria: Secondary | ICD-10-CM | POA: Diagnosis not present

## 2020-11-08 LAB — ANA W/REFLEX: ANA Titer 1: NEGATIVE

## 2020-11-10 LAB — ANA W/REFLEX: ANA Titer 1: NEGATIVE

## 2020-11-11 DIAGNOSIS — E782 Mixed hyperlipidemia: Secondary | ICD-10-CM | POA: Diagnosis not present

## 2020-11-11 DIAGNOSIS — N183 Chronic kidney disease, stage 3 unspecified: Secondary | ICD-10-CM | POA: Diagnosis not present

## 2020-11-11 DIAGNOSIS — Z1329 Encounter for screening for other suspected endocrine disorder: Secondary | ICD-10-CM | POA: Diagnosis not present

## 2020-11-11 DIAGNOSIS — E7849 Other hyperlipidemia: Secondary | ICD-10-CM | POA: Diagnosis not present

## 2020-11-11 DIAGNOSIS — R5383 Other fatigue: Secondary | ICD-10-CM | POA: Diagnosis not present

## 2020-11-16 DIAGNOSIS — E876 Hypokalemia: Secondary | ICD-10-CM | POA: Diagnosis not present

## 2020-11-16 DIAGNOSIS — I10 Essential (primary) hypertension: Secondary | ICD-10-CM | POA: Diagnosis not present

## 2020-11-16 DIAGNOSIS — M797 Fibromyalgia: Secondary | ICD-10-CM | POA: Diagnosis not present

## 2020-11-16 DIAGNOSIS — K2100 Gastro-esophageal reflux disease with esophagitis, without bleeding: Secondary | ICD-10-CM | POA: Diagnosis not present

## 2020-11-16 DIAGNOSIS — R4582 Worries: Secondary | ICD-10-CM | POA: Diagnosis not present

## 2020-11-16 DIAGNOSIS — E7849 Other hyperlipidemia: Secondary | ICD-10-CM | POA: Diagnosis not present

## 2020-11-16 DIAGNOSIS — G43909 Migraine, unspecified, not intractable, without status migrainosus: Secondary | ICD-10-CM | POA: Diagnosis not present

## 2020-11-17 ENCOUNTER — Ambulatory Visit (INDEPENDENT_AMBULATORY_CARE_PROVIDER_SITE_OTHER): Payer: Medicare Other | Admitting: Diagnostic Neuroimaging

## 2020-11-17 ENCOUNTER — Encounter: Payer: Medicare Other | Admitting: Diagnostic Neuroimaging

## 2020-11-17 DIAGNOSIS — Z0289 Encounter for other administrative examinations: Secondary | ICD-10-CM

## 2020-11-17 DIAGNOSIS — E539 Vitamin B deficiency, unspecified: Secondary | ICD-10-CM

## 2020-11-17 DIAGNOSIS — R200 Anesthesia of skin: Secondary | ICD-10-CM

## 2020-11-21 NOTE — Procedures (Signed)
[facility]  NCS (NERVE CONDUCTION STUDY) WITH EMG (ELECTROMYOGRAPHY) REPORT   STUDY DATE: 11/21/20 PATIENT NAME: [name] DOB: 1955-04-04 MRN: [id number]  ORDERING CLINICIAN: Andrey Spearman, MD   TECHNOLOGIST: Sherre Scarlet ELECTROMYOGRAPHER: Earlean Polka. Kalina Morabito, MD  CLINICAL INFORMATION: 65 year old female with bilateral foot pain.  History of multiple lumbar spine surgeries.  FINDINGS: NERVE CONDUCTION STUDY:  Bilateral peroneal and right tibial motor responses are normal.  Left tibial motor response has normal distal latency, slightly decreased amplitude and normal conduction velocity.  Bilateral tibial F wave latencies are normal.  Bilateral sural and superficial peroneal sensory responses are normal.  Bilateral medial and lateral plantar responses could not be obtained.    NEEDLE ELECTROMYOGRAPHY:  Needle examination of left vastus medialis, left tibialis anterior and left gastrocnemius muscles is normal.   IMPRESSION:   This study demonstrates: -Decreased amplitude of left tibial motor response, but otherwise unremarkable nerve conduction and needle EMG. This could be related to left S1 radiculopathy (in the setting of normal sural sensory response and history of lumbar spine surgeries).  -Absent medial and lateral plantar responses bilaterally could be related to distal polyneuropathy versus limitation due to technical factors.   INTERPRETING PHYSICIAN:  Penni Bombard, MD Certified in Neurology, Neurophysiology and Neuroimaging  [facility] [address], [place],  [address] [phone]   [facility]    Nerve / Sites Muscle Latency Ref. Amplitude Ref. Rel Amp Segments Distance Velocity Ref. Area    ms ms mV mV %  cm m/s m/s mVms  L Peroneal - EDB     Ankle EDB 5.8 ?6.5 7.7 ?2.0 100 Ankle - EDB 9   27.6     Fib head EDB 12.2  6.9  89.3 Fib head - Ankle 28 44 ?44 26.4     Pop fossa EDB 14.5  6.9   99.5 Pop fossa - Fib head 10 44 ?44 26.9         Pop fossa - Ankle      R Peroneal - EDB     Ankle EDB 4.7 ?6.5 7.9 ?2.0 100 Ankle - EDB 9   29.7     Fib head EDB 11.1  7.1  90.1 Fib head - Ankle 28 44 ?44 25.0     Pop fossa EDB 13.4  7.1  99.3 Pop fossa - Fib head 10 44 ?44 25.4         Pop fossa - Ankle      L Tibial - AH     Ankle AH 4.0 ?5.8 3.2 ?4.0 100 Ankle - AH 9   16.3     Pop fossa AH 13.0  3.2  101 Pop fossa - Ankle 37 41 ?41 14.3  R Tibial - AH     Ankle AH 4.6 ?5.8 10.2 ?4.0 100 Ankle - AH 9   28.9     Pop fossa AH 13.7  7.8  76.6 Pop fossa - Ankle 37 41 ?41 26.6                  SNC    Nerve / Sites Rec. Site Peak Lat Ref.  Amp Ref. Segments Distance Peak Diff Ref.    ms ms V V  cm ms ms  L Sural - Ankle (Calf)     Calf Ankle 3.6 ?4.4 14 ?6 Calf - Ankle 14    R Sural - Ankle (Calf)     Calf Ankle 3.5 ?  4.4 13 ?6 Calf - Ankle 14    L Superficial peroneal - Ankle     Lat leg Ankle 3.3 ?4.4 7 ?6 Lat leg - Ankle 14    R Superficial peroneal - Ankle     Lat leg Ankle 3.1 ?4.4 8 ?6 Lat leg - Ankle 14    L Medial plantar, Lateral plantar - Ankle (Medial, lateral sole)     Medial plantar Sole Ankle NR ?3.7 NR ?3 Medial plantar Sole - Ankle 14       Lateral plantar Sole Ankle NR ?3.7 NR ?3 Lateral plantar Sole - Ankle 14          Medial plantar Sole - Lateral plantar Sole  NR ?0.0  R Medial plantar, Lateral plantar - Ankle (Medial, lateral sole)     Medial plantar Sole Ankle NR ?3.7 NR ?3 Medial plantar Sole - Ankle 14       Lateral plantar Sole Ankle NR ?3.7 NR ?3 Lateral plantar Sole - Ankle 14          Medial plantar Sole - Lateral plantar Sole  NR ?0.0                 F  Wave    Nerve F Lat Ref.   ms ms  L Tibial - AH 55.7 ?56.0  R Tibial - AH 55.7 ?56.0         EMG Summary Table    Spontaneous MUAP Recruitment  Muscle IA Fib PSW Fasc Other Amp Dur. Poly Pattern  L. Vastus medialis Normal None None None _______ Normal Normal Normal Normal  L. Tibialis  anterior Normal None None None _______ Normal Normal Normal Normal  L. Gastrocnemius (Medial head) Normal None None None _______ Normal Normal Normal Normal

## 2020-12-16 ENCOUNTER — Encounter: Payer: Self-pay | Admitting: Specialist

## 2020-12-16 ENCOUNTER — Ambulatory Visit: Payer: Medicare Other | Admitting: Specialist

## 2020-12-16 ENCOUNTER — Other Ambulatory Visit: Payer: Self-pay

## 2020-12-16 VITALS — BP 112/72 | HR 59 | Ht 67.0 in | Wt 167.0 lb

## 2020-12-16 DIAGNOSIS — G603 Idiopathic progressive neuropathy: Secondary | ICD-10-CM

## 2020-12-16 DIAGNOSIS — M960 Pseudarthrosis after fusion or arthrodesis: Secondary | ICD-10-CM

## 2020-12-16 DIAGNOSIS — M48062 Spinal stenosis, lumbar region with neurogenic claudication: Secondary | ICD-10-CM | POA: Diagnosis not present

## 2020-12-16 DIAGNOSIS — M5135 Other intervertebral disc degeneration, thoracolumbar region: Secondary | ICD-10-CM

## 2020-12-16 DIAGNOSIS — M4015 Other secondary kyphosis, thoracolumbar region: Secondary | ICD-10-CM | POA: Diagnosis not present

## 2020-12-16 NOTE — Patient Instructions (Signed)
Avoid frequent bending and stooping  No lifting greater than 10 lbs. May use ice or moist heat for pain. Weight loss is of benefit. Best medication for lumbar disc disease is arthritis medications like motrin, celebrex and naprosyn. Exercise is important to improve your indurance and does allow people to function better inspite of back pain.   

## 2020-12-16 NOTE — Progress Notes (Signed)
Office Visit Note   Patient: [name]           Date of Birth: 06/13/1955           MRN: [id number] Visit Date: 12/16/2020              Requested by: Caryl Bis, MD [place],  [place] [id number] PCP: Caryl Bis, MD   Assessment & Plan: Visit Diagnoses:  1. Spinal stenosis of lumbar region with neurogenic claudication   2. Other secondary kyphosis, thoracolumbar region   3. DDD (degenerative disc disease), thoracolumbar   4. Pseudarthrosis after fusion or arthrodesis   5. Idiopathic progressive neuropathy     Plan: Avoid frequent bending and stooping  No lifting greater than 10 lbs. May use ice or moist heat for pain.   Weight loss is of benefit. Best medication for lumbar disc disease is arthritis medications like motrin, celebrex and naprosyn. Exercise is important to improve your indurance and does allow people to function better inspite of back pain.  Follow-Up Instructions: No follow-ups on file.   Orders:  No orders of the defined types were placed in this encounter.  No orders of the defined types were placed in this encounter.     Procedures: No procedures performed   Clinical Data: No additional findings.   Subjective: Chief Complaint  Patient presents with   Lower Back - Pain, Follow-up    S/p LE EMG/NCS with Dr. Leta Baptist    65 year old female with bilateral foot numbness with recent EMG/NCV showing absent electrical activity in the feet and left tibial changes that are mild. No bowel or bladder difficulty. She has allergies to most neuromembrane stablizers.   Review of Systems  Constitutional: Negative.   HENT: Negative.    Eyes: Negative.   Respiratory: Negative.    Cardiovascular: Negative.   Gastrointestinal: Negative.   Endocrine: Negative.   Genitourinary: Negative.   Musculoskeletal: Negative.   Skin: Negative.   Allergic/Immunologic: Negative.   Neurological: Negative.   Hematological: Negative.    Psychiatric/Behavioral: Negative.      Objective: Vital Signs: BP 112/72 (BP Location: Right Arm, Patient Position: Sitting, Cuff Size: Normal)   Pulse (!) 59   Ht 5\' 7"  (1.702 m)   Wt 167 lb (75.8 kg)   BMI 26.16 kg/m   Physical Exam Constitutional:      Appearance: She is well-developed.  HENT:     Head: Normocephalic and atraumatic.  Eyes:     Pupils: Pupils are equal, round, and reactive to light.  Pulmonary:     Effort: Pulmonary effort is normal.     Breath sounds: Normal breath sounds.  Abdominal:     General: Bowel sounds are normal.     Palpations: Abdomen is soft.  Musculoskeletal:     Cervical back: Normal range of motion and neck supple.  Skin:    General: Skin is warm and dry.  Neurological:     Mental Status: She is alert and oriented to person, place, and time.  Psychiatric:        Behavior: Behavior normal.        Thought Content: Thought content normal.        Judgment: Judgment normal.   Back Exam   Tenderness  The patient is experiencing tenderness in the lumbar.  Range of Motion  Extension:  abnormal  Flexion:  abnormal  Lateral bend right:  normal  Lateral bend left:  normal  Rotation right:  normal  Rotation left:  normal   Muscle Strength  Right Quadriceps:  5/5  Left Quadriceps:  5/5  Right Hamstrings:  5/5  Left Hamstrings:  5/5   Comments:  Motor is normal.  EMG/NCV reviewed with her.     Specialty Comments:  No specialty comments available.  Imaging: No results found.   PMFS History: Patient Active Problem List   Diagnosis Date Noted   Spondylolysis, lumbar region 03/09/2020    Priority: High    Class: Chronic   Incidental durotomy 03/09/2020    Priority: High    Class: Acute   Pseudarthrosis after fusion or arthrodesis 08/09/2014    Priority: High    Class: Chronic   Painful orthopaedic hardware ([place]) 08/09/2014    Priority: High    Class: Chronic   Spinal stenosis, lumbar region, with neurogenic  claudication 04/07/2013    Priority: High    Class: Chronic   Degenerative spondylolisthesis 04/07/2013    Priority: High    Class: Chronic   Ileus, postoperative ([place]) 08/13/2014    Priority: Medium    Class: Acute   Other kyphosis, thoracolumbar region    DDD (degenerative disc disease), thoracic    Postural kyphosis of thoracolumbar region    Fusion of spine of thoracolumbar region 03/08/2020   Seasonal and perennial allergic rhinitis 02/18/2019   Vulvar cysts 04/26/2014   Spinal stenosis of lumbar region with neurogenic claudication 04/07/2013   Past Medical History:  Diagnosis Date   Arthritis    Asthma    Cancer ([place])    cervical , skin   Cold    recent  rx   Fibromyalgia    GERD (gastroesophageal reflux disease)    H/O hiatal hernia    Hypertension    PONV (postoperative nausea and vomiting)    Shortness of breath    Sleep apnea    ' mild " does not use cpap   Tarsal tunnel syndrome of both lower extremities     Family History  Problem Relation Age of Onset   Allergic rhinitis Neg Hx    Angioedema Neg Hx    Asthma Neg Hx    Atopy Neg Hx    Eczema Neg Hx    Immunodeficiency Neg Hx    Urticaria Neg Hx     Past Surgical History:  Procedure Laterality Date   ABDOMINAL HYSTERECTOMY  2004   APPENDECTOMY     BACK SURGERY     x3   BACK SURGERY  02/2020   rod placed in back   CERVICAL CONIZATION W/BX     [place]  2006   CHOLECYSTECTOMY  2003   LUMBAR FUSION  04/07/2013   L 2 L3 L4    with rod     DR Carrisa Keller   TONSILLECTOMY     Social History   Occupational History   Not on file  Tobacco Use   Smoking status: Never   Smokeless tobacco: Never  Vaping Use   Vaping Use: Never used  Substance and Sexual Activity   Alcohol use: No   Drug use: No   Sexual activity: Yes    Birth control/protection: Surgical

## 2020-12-23 DIAGNOSIS — Z23 Encounter for immunization: Secondary | ICD-10-CM | POA: Diagnosis not present

## 2021-01-10 DIAGNOSIS — Z23 Encounter for immunization: Secondary | ICD-10-CM | POA: Diagnosis not present

## 2021-01-11 DIAGNOSIS — J0101 Acute recurrent maxillary sinusitis: Secondary | ICD-10-CM | POA: Diagnosis not present

## 2021-02-08 DIAGNOSIS — R059 Cough, unspecified: Secondary | ICD-10-CM | POA: Diagnosis not present

## 2021-02-08 DIAGNOSIS — J019 Acute sinusitis, unspecified: Secondary | ICD-10-CM | POA: Diagnosis not present

## 2021-02-18 DIAGNOSIS — J329 Chronic sinusitis, unspecified: Secondary | ICD-10-CM | POA: Diagnosis not present

## 2021-02-24 DIAGNOSIS — E782 Mixed hyperlipidemia: Secondary | ICD-10-CM | POA: Diagnosis not present

## 2021-02-24 DIAGNOSIS — E612 Magnesium deficiency: Secondary | ICD-10-CM | POA: Diagnosis not present

## 2021-02-24 DIAGNOSIS — E7849 Other hyperlipidemia: Secondary | ICD-10-CM | POA: Diagnosis not present

## 2021-02-24 DIAGNOSIS — K529 Noninfective gastroenteritis and colitis, unspecified: Secondary | ICD-10-CM | POA: Diagnosis not present

## 2021-02-24 DIAGNOSIS — Z1329 Encounter for screening for other suspected endocrine disorder: Secondary | ICD-10-CM | POA: Diagnosis not present

## 2021-02-24 DIAGNOSIS — E876 Hypokalemia: Secondary | ICD-10-CM | POA: Diagnosis not present

## 2021-02-24 DIAGNOSIS — Z0000 Encounter for general adult medical examination without abnormal findings: Secondary | ICD-10-CM | POA: Diagnosis not present

## 2021-02-24 DIAGNOSIS — Z1159 Encounter for screening for other viral diseases: Secondary | ICD-10-CM | POA: Diagnosis not present

## 2021-02-24 DIAGNOSIS — R5383 Other fatigue: Secondary | ICD-10-CM | POA: Diagnosis not present

## 2021-02-24 DIAGNOSIS — I10 Essential (primary) hypertension: Secondary | ICD-10-CM | POA: Diagnosis not present

## 2021-03-01 DIAGNOSIS — E876 Hypokalemia: Secondary | ICD-10-CM | POA: Diagnosis not present

## 2021-03-01 DIAGNOSIS — M797 Fibromyalgia: Secondary | ICD-10-CM | POA: Diagnosis not present

## 2021-03-01 DIAGNOSIS — Z0001 Encounter for general adult medical examination with abnormal findings: Secondary | ICD-10-CM | POA: Diagnosis not present

## 2021-03-01 DIAGNOSIS — G43909 Migraine, unspecified, not intractable, without status migrainosus: Secondary | ICD-10-CM | POA: Diagnosis not present

## 2021-03-01 DIAGNOSIS — I10 Essential (primary) hypertension: Secondary | ICD-10-CM | POA: Diagnosis not present

## 2021-03-01 DIAGNOSIS — E7849 Other hyperlipidemia: Secondary | ICD-10-CM | POA: Diagnosis not present

## 2021-03-10 DIAGNOSIS — I10 Essential (primary) hypertension: Secondary | ICD-10-CM | POA: Diagnosis not present

## 2021-03-10 DIAGNOSIS — N183 Chronic kidney disease, stage 3 unspecified: Secondary | ICD-10-CM | POA: Diagnosis not present

## 2021-03-10 DIAGNOSIS — K219 Gastro-esophageal reflux disease without esophagitis: Secondary | ICD-10-CM | POA: Diagnosis not present

## 2021-03-17 ENCOUNTER — Other Ambulatory Visit: Payer: Self-pay

## 2021-03-17 ENCOUNTER — Ambulatory Visit: Payer: Medicare Other | Admitting: Specialist

## 2021-03-17 ENCOUNTER — Encounter: Payer: Self-pay | Admitting: Specialist

## 2021-03-17 VITALS — BP 129/87 | HR 73 | Ht 67.0 in | Wt 167.0 lb

## 2021-03-17 DIAGNOSIS — M5135 Other intervertebral disc degeneration, thoracolumbar region: Secondary | ICD-10-CM | POA: Diagnosis not present

## 2021-03-17 DIAGNOSIS — M48062 Spinal stenosis, lumbar region with neurogenic claudication: Secondary | ICD-10-CM | POA: Diagnosis not present

## 2021-03-17 DIAGNOSIS — Z981 Arthrodesis status: Secondary | ICD-10-CM

## 2021-03-17 NOTE — Patient Instructions (Signed)
Plan: Avoid frequent bending and stooping  No lifting greater than 10 lbs. May use ice or moist heat for pain. Weight loss is of benefit. If you have stage 3 kidney disease likely you should not take celebrex or meloxicam or other NSAIDS Exercise is important to improve your indurance and does allow people to function better inspite of back pain.  Zotrix creame over the counter can help with nerve pain in the hands and feet.   Follow-Up Instructions: No follow-ups on file.

## 2021-03-17 NOTE — Progress Notes (Signed)
Office Visit Note   Patient: [name]           Date of Birth: 30-Dec-1955           MRN: [id number] Visit Date: 03/17/2021              Requested by: Caryl Bis, MD [place],  [place] [address] PCP: Caryl Bis, MD   Assessment & Plan: Visit Diagnoses:  1. Spinal stenosis of lumbar region with neurogenic claudication   2. DDD (degenerative disc disease), thoracolumbar   3. Status post lumbar spinal fusion     Plan: Avoid frequent bending and stooping  No lifting greater than 10 lbs. May use ice or moist heat for pain. Weight loss is of benefit. If you have stage 3 kidney disease likely you should not take celebrex or meloxicam or other NSAIDS Exercise is important to improve your indurance and does allow people to function better inspite of back pain.  Zotrix creame over the counter can help with nerve pain in the hands and feet.   Follow-Up Instructions: No follow-ups on file.   Orders:  No orders of the defined types were placed in this encounter.  No orders of the defined types were placed in this encounter.     Procedures: No procedures performed   Clinical Data: No additional findings.   Subjective: Chief Complaint  Patient presents with   Lower Back - Follow-up, Pain    65 year old female one year post repair of sweudarthrosis and extension of fusion. She married recently and has had some sinus issues. She related that she has intermittant back pain especially with dancing at wedding 02/03/2021.    Review of Systems  Constitutional: Negative.   HENT: Negative.    Eyes: Negative.   Respiratory: Negative.    Cardiovascular: Negative.   Gastrointestinal: Negative.   Endocrine: Negative.   Genitourinary: Negative.   Musculoskeletal: Negative.   Skin: Negative.   Allergic/Immunologic: Negative.   Neurological: Negative.   Hematological: Negative.   Psychiatric/Behavioral: Negative.      Objective: Vital Signs: BP 129/87  (BP Location: Left Arm, Patient Position: Sitting)    Pulse 73    Ht 5\' 7"  (1.702 m)    Wt 167 lb (75.8 kg)    BMI 26.16 kg/m   Physical Exam Constitutional:      Appearance: She is well-developed.  HENT:     Head: Normocephalic and atraumatic.  Eyes:     Pupils: Pupils are equal, round, and reactive to light.  Pulmonary:     Effort: Pulmonary effort is normal.     Breath sounds: Normal breath sounds.  Abdominal:     General: Bowel sounds are normal.     Palpations: Abdomen is soft.  Musculoskeletal:     Cervical back: Normal range of motion and neck supple.  Skin:    General: Skin is warm and dry.  Neurological:     Mental Status: She is alert and oriented to person, place, and time.  Psychiatric:        Behavior: Behavior normal.        Thought Content: Thought content normal.        Judgment: Judgment normal.    Back Exam   Tenderness  The patient is experiencing tenderness in the lumbar.  Range of Motion  Extension:  abnormal  Flexion:  abnormal  Lateral bend right:  abnormal  Lateral bend left:  abnormal  Rotation right:  abnormal  Rotation left:  abnormal   Muscle Strength  The patient has normal back strength. Left Quadriceps:  5/5  Right Hamstrings:  5/5  Left Hamstrings:  5/5   Reflexes  Patellar:  2/4 Achilles:  2/4  Comments:  Motor is normal, SLR is normal.     Specialty Comments:  No specialty comments available.  Imaging: No results found.   PMFS History: Patient Active Problem List   Diagnosis Date Noted   Spondylolysis, lumbar region 03/09/2020    Priority: High    Class: Chronic   Incidental durotomy 03/09/2020    Priority: High    Class: Acute   Pseudarthrosis after fusion or arthrodesis 08/09/2014    Priority: High    Class: Chronic   Painful orthopaedic hardware ([place]) 08/09/2014    Priority: High    Class: Chronic   Spinal stenosis, lumbar region, with neurogenic claudication 04/07/2013    Priority: High    Class:  Chronic   Degenerative spondylolisthesis 04/07/2013    Priority: High    Class: Chronic   Ileus, postoperative ([place]) 08/13/2014    Priority: Medium     Class: Acute   Other kyphosis, thoracolumbar region    DDD (degenerative disc disease), thoracic    Postural kyphosis of thoracolumbar region    Fusion of spine of thoracolumbar region 03/08/2020   Seasonal and perennial allergic rhinitis 02/18/2019   Vulvar cysts 04/26/2014   Spinal stenosis of lumbar region with neurogenic claudication 04/07/2013   Past Medical History:  Diagnosis Date   Arthritis    Asthma    Cancer ([place])    cervical , skin   Cold    recent  rx   Fibromyalgia    GERD (gastroesophageal reflux disease)    H/O hiatal hernia    Hypertension    PONV (postoperative nausea and vomiting)    Shortness of breath    Sleep apnea    ' mild " does not use cpap   Tarsal tunnel syndrome of both lower extremities     Family History  Problem Relation Age of Onset   Allergic rhinitis Neg Hx    Angioedema Neg Hx    Asthma Neg Hx    Atopy Neg Hx    Eczema Neg Hx    Immunodeficiency Neg Hx    Urticaria Neg Hx     Past Surgical History:  Procedure Laterality Date   ABDOMINAL HYSTERECTOMY  2004   APPENDECTOMY     BACK SURGERY     x3   BACK SURGERY  02/2020   rod placed in back   CERVICAL CONIZATION W/BX     [place]  2006   CHOLECYSTECTOMY  2003   LUMBAR FUSION  04/07/2013   L 2 L3 L4    with rod     DR Kenya Kook   TONSILLECTOMY     Social History   Occupational History   Not on file  Tobacco Use   Smoking status: Never   Smokeless tobacco: Never  Vaping Use   Vaping Use: Never used  Substance and Sexual Activity   Alcohol use: No   Drug use: No   Sexual activity: Yes    Birth control/protection: Surgical

## 2021-04-18 DIAGNOSIS — M797 Fibromyalgia: Secondary | ICD-10-CM | POA: Diagnosis not present

## 2021-04-18 DIAGNOSIS — E538 Deficiency of other specified B group vitamins: Secondary | ICD-10-CM | POA: Diagnosis not present

## 2021-04-29 DIAGNOSIS — H5213 Myopia, bilateral: Secondary | ICD-10-CM | POA: Diagnosis not present

## 2021-06-01 DIAGNOSIS — N183 Chronic kidney disease, stage 3 unspecified: Secondary | ICD-10-CM | POA: Diagnosis not present

## 2021-06-01 DIAGNOSIS — E7849 Other hyperlipidemia: Secondary | ICD-10-CM | POA: Diagnosis not present

## 2021-06-01 DIAGNOSIS — E782 Mixed hyperlipidemia: Secondary | ICD-10-CM | POA: Diagnosis not present

## 2021-06-07 DIAGNOSIS — M797 Fibromyalgia: Secondary | ICD-10-CM | POA: Diagnosis not present

## 2021-06-07 DIAGNOSIS — E876 Hypokalemia: Secondary | ICD-10-CM | POA: Diagnosis not present

## 2021-06-07 DIAGNOSIS — I10 Essential (primary) hypertension: Secondary | ICD-10-CM | POA: Diagnosis not present

## 2021-06-07 DIAGNOSIS — E7849 Other hyperlipidemia: Secondary | ICD-10-CM | POA: Diagnosis not present

## 2021-06-07 DIAGNOSIS — R4582 Worries: Secondary | ICD-10-CM | POA: Diagnosis not present

## 2021-06-07 DIAGNOSIS — G43909 Migraine, unspecified, not intractable, without status migrainosus: Secondary | ICD-10-CM | POA: Diagnosis not present

## 2021-06-29 DIAGNOSIS — I10 Essential (primary) hypertension: Secondary | ICD-10-CM | POA: Diagnosis not present

## 2021-06-29 DIAGNOSIS — M797 Fibromyalgia: Secondary | ICD-10-CM | POA: Diagnosis not present

## 2021-06-29 DIAGNOSIS — R079 Chest pain, unspecified: Secondary | ICD-10-CM | POA: Diagnosis not present

## 2021-07-06 DIAGNOSIS — Z1231 Encounter for screening mammogram for malignant neoplasm of breast: Secondary | ICD-10-CM | POA: Diagnosis not present

## 2021-07-20 DIAGNOSIS — I10 Essential (primary) hypertension: Secondary | ICD-10-CM | POA: Diagnosis not present

## 2021-07-20 DIAGNOSIS — R059 Cough, unspecified: Secondary | ICD-10-CM | POA: Diagnosis not present

## 2021-07-20 DIAGNOSIS — R0981 Nasal congestion: Secondary | ICD-10-CM | POA: Diagnosis not present

## 2021-07-31 ENCOUNTER — Encounter: Payer: Self-pay | Admitting: *Deleted

## 2021-08-01 ENCOUNTER — Encounter: Payer: Self-pay | Admitting: Cardiology

## 2021-08-01 ENCOUNTER — Ambulatory Visit: Payer: Medicare Other | Admitting: Cardiology

## 2021-08-01 VITALS — BP 124/74 | HR 57 | Ht 67.0 in | Wt 164.0 lb

## 2021-08-01 DIAGNOSIS — R079 Chest pain, unspecified: Secondary | ICD-10-CM | POA: Diagnosis not present

## 2021-08-01 NOTE — Patient Instructions (Addendum)
Medication Instructions:   Your physician recommends that you continue on your current medications as directed. Please refer to the Current Medication list given to you today.  Labwork:  none  Testing/Procedures:  none  Follow-Up:  Your physician recommends that you schedule a follow-up appointment in: as needed.  Any Other Special Instructions Will Be Listed Below (If Applicable).  If you need a refill on your cardiac medications before your next appointment, please call your pharmacy. 

## 2021-08-01 NOTE — Progress Notes (Signed)
? ? ? ?Clinical Summary ?Ms. Hakanson is a 66 y.o.female seen as a new consult, referred by Dr Quillian Quince for the following medical problems ? ?1.Chest pain ?- episode 1 month ago sitting in recliner. Started having sharp chest pain/epigastric pain, radiated left and right and through and back. +nauseous, vomited. Pain lasted about 1-2 hours, better with vomiting. Numbness down right. The next day felt fatigued, tired. Woke that night and had some diarrhea.  ?- seen by pcp the next. Some ongoing discomfort.  ?- since that time nonspecific sharp pains, sometimes after eating ?- sedentary lifestule due to back surgeries.  ?- pcp recently increased protonix  ? ?Past Medical History:  ?Diagnosis Date  ? Arthritis   ? Asthma   ? Cancer [facility])   ? cervical , skin  ? Cold   ? recent  rx  ? Fibromyalgia   ? GERD (gastroesophageal reflux disease)   ? H/O hiatal hernia   ? Hypertension   ? PONV (postoperative nausea and vomiting)   ? Shortness of breath   ? Sleep apnea   ? ' mild " does not use cpap  ? Tarsal tunnel syndrome of both lower extremities   ? ? ? ?Allergies  ?Allergen Reactions  ? Cephalosporins Nausea And Vomiting  ? Codeine Hives and Palpitations  ?  Hydrocodone, Ultram  ? Doxycycline Nausea And Vomiting  ? Novocain [Procaine] Swelling  ? Other Nausea And Vomiting  ?  Sensitive to pain medications  ? Statins Other (See Comments)  ?  myalgia  ? Sulfa Antibiotics Hives and Nausea And Vomiting  ? Tricyclic Antidepressants Nausea And Vomiting  ?  Cymbalta and alike meds  ? Ace Inhibitors Cough  ? Asa [Aspirin] Nausea And Vomiting  ? Morphine And Related   ?  High blood pressure   ? Ultram [Tramadol] Other (See Comments)  ?  myalgia  ? Zetia [Ezetimibe] Other (See Comments)  ?  Muscle Aches  ? ? ? ?Current Outpatient Medications  ?Medication Sig Dispense Refill  ? acetaminophen (TYLENOL) 650 MG CR tablet Take 1,300 mg by mouth every 8 (eight) hours as needed for pain.    ? albuterol (VENTOLIN HFA) 108 (90 Base) MCG/ACT  inhaler Inhale 1 puff into the lungs every 6 (six) hours as needed for wheezing or shortness of breath.    ? celecoxib (CELEBREX) 200 MG capsule Take 200 mg by mouth daily.    ? cetirizine (ZYRTEC) 10 MG tablet Take 10 mg by mouth daily.    ? clonazePAM (KLONOPIN) 0.5 MG tablet Take 0.5 mg by mouth at bedtime.    ? EPINEPHrine 0.3 mg/0.3 mL IJ SOAJ injection Inject 0.3 mg into the muscle as needed for anaphylaxis.  (Patient not taking: Reported on 11/01/2020)    ? estradiol (ESTRACE) 1 MG tablet Take 1 mg by mouth daily.    ? fexofenadine (ALLEGRA) 180 MG tablet Take 180 mg by mouth daily.    ? Metoprolol-Hydrochlorothiazide 50-12.5 MG TB24 Take 0.5 tablets by mouth daily.     ? omeprazole (PRILOSEC) 40 MG capsule Take 40 mg by mouth in the morning and at bedtime.     ? pantoprazole (PROTONIX) 40 MG tablet Take 40 mg by mouth 2 (two) times daily.    ? Probiotic Product (PROBIOTIC DAILY PO) Take 1 capsule by mouth daily.    ? ?No current facility-administered medications for this visit.  ? ? ? ?Past Surgical History:  ?Procedure Laterality Date  ? ABDOMINAL HYSTERECTOMY  2004  ?  APPENDECTOMY    ? BACK SURGERY    ? x3  ? BACK SURGERY  02/2020  ? rod placed in back  ? CERVICAL CONIZATION W/BX    ? [place] SURGERY  2006  ? CHOLECYSTECTOMY  2003  ? LUMBAR FUSION  04/07/2013  ? L 2 L3 L4    with rod     DR NITKA  ? TONSILLECTOMY    ? ? ? ?Allergies  ?Allergen Reactions  ? Cephalosporins Nausea And Vomiting  ? Codeine Hives and Palpitations  ?  Hydrocodone, Ultram  ? Doxycycline Nausea And Vomiting  ? Novocain [Procaine] Swelling  ? Other Nausea And Vomiting  ?  Sensitive to pain medications  ? Statins Other (See Comments)  ?  myalgia  ? Sulfa Antibiotics Hives and Nausea And Vomiting  ? Tricyclic Antidepressants Nausea And Vomiting  ?  Cymbalta and alike meds  ? Ace Inhibitors Cough  ? Asa [Aspirin] Nausea And Vomiting  ? Morphine And Related   ?  High blood pressure   ? Ultram [Tramadol] Other (See Comments)  ?   myalgia  ? Zetia [Ezetimibe] Other (See Comments)  ?  Muscle Aches  ? ? ? ? ?Family History  ?Problem Relation Age of Onset  ? Allergic rhinitis Neg Hx   ? Angioedema Neg Hx   ? Asthma Neg Hx   ? Atopy Neg Hx   ? Eczema Neg Hx   ? Immunodeficiency Neg Hx   ? Urticaria Neg Hx   ? ? ? ?Social History ?Ms. Fayette reports that she has never smoked. She has never used smokeless tobacco. ?Ms. Raglin reports no history of alcohol use. ? ? ?Review of Systems ?CONSTITUTIONAL: No weight loss, fever, chills, weakness or fatigue.  ?HEENT: Eyes: No visual loss, blurred vision, double vision or yellow sclerae.No hearing loss, sneezing, congestion, runny nose or sore throat.  ?SKIN: No rash or itching.  ?CARDIOVASCULAR: per hpi ?RESPIRATORY: No shortness of breath, cough or sputum.  ?GASTROINTESTINAL: per hpi ?GENITOURINARY: No burning on urination, no polyuria ?NEUROLOGICAL: No headache, dizziness, syncope, paralysis, ataxia, numbness or tingling in the extremities. No change in bowel or bladder control.  ?MUSCULOSKELETAL: No muscle, back pain, joint pain or stiffness.  ?LYMPHATICS: No enlarged nodes. No history of splenectomy.  ?PSYCHIATRIC: No history of depression or anxiety.  ?ENDOCRINOLOGIC: No reports of sweating, cold or heat intolerance. No polyuria or polydipsia.  ?. ? ? ?Physical Examination ?Today's Vitals  ? 08/01/21 1027  ?BP: 124/74  ?Pulse: (!) 57  ?SpO2: 99%  ?Weight: 164 lb (74.4 kg)  ?Height: '5\' 7"'$  (1.702 m)  ? ?Body mass index is 25.69 kg/m?. ? ?Gen: resting comfortably, no acute distress ?HEENT: no scleral icterus, pupils equal round and reactive, no palptable cervical adenopathy,  ?CV: RRR, no m/r/g no jvd ?Resp: Clear to auscultation bilaterally ?GI: abdomen is soft, non-tender, non-distended, normal bowel sounds, no hepatosplenomegaly ?MSK: extremities are warm, no edema.  ?Skin: warm, no rash ?Neuro:  no focal deficits ?Psych: appropriate affect ? ? ? ?Assessment and Plan  ?1.Chest pain/epigastric pain ?-  symptoms more suggestive of GI etiology.  ?- EKG today shows NSR, no ischemic changes ?- at this time would not pursue ischemic testing, may warrant GI referal but will defer to pcp. ?- if change is symptoms could reassess  ? ? ? ? ? ?Arnoldo Lenis, M.D. ?

## 2021-08-15 DIAGNOSIS — J329 Chronic sinusitis, unspecified: Secondary | ICD-10-CM | POA: Diagnosis not present

## 2021-08-15 DIAGNOSIS — I83813 Varicose veins of bilateral lower extremities with pain: Secondary | ICD-10-CM | POA: Diagnosis not present

## 2021-08-16 ENCOUNTER — Encounter: Payer: Self-pay | Admitting: Internal Medicine

## 2021-08-31 DIAGNOSIS — M79604 Pain in right leg: Secondary | ICD-10-CM | POA: Diagnosis not present

## 2021-09-05 DIAGNOSIS — M25552 Pain in left hip: Secondary | ICD-10-CM | POA: Diagnosis not present

## 2021-09-05 DIAGNOSIS — H8110 Benign paroxysmal vertigo, unspecified ear: Secondary | ICD-10-CM | POA: Diagnosis not present

## 2021-09-06 ENCOUNTER — Ambulatory Visit (INDEPENDENT_AMBULATORY_CARE_PROVIDER_SITE_OTHER): Payer: Medicare Other | Admitting: Internal Medicine

## 2021-09-06 ENCOUNTER — Encounter: Payer: Self-pay | Admitting: *Deleted

## 2021-09-06 ENCOUNTER — Encounter: Payer: Self-pay | Admitting: Internal Medicine

## 2021-09-06 ENCOUNTER — Telehealth: Payer: Self-pay | Admitting: *Deleted

## 2021-09-06 VITALS — BP 126/82 | HR 74 | Temp 97.7°F | Ht 67.0 in | Wt 165.3 lb

## 2021-09-06 DIAGNOSIS — G8929 Other chronic pain: Secondary | ICD-10-CM

## 2021-09-06 DIAGNOSIS — K219 Gastro-esophageal reflux disease without esophagitis: Secondary | ICD-10-CM

## 2021-09-06 DIAGNOSIS — R1013 Epigastric pain: Secondary | ICD-10-CM

## 2021-09-06 DIAGNOSIS — Z800 Family history of malignant neoplasm of digestive organs: Secondary | ICD-10-CM | POA: Diagnosis not present

## 2021-09-06 DIAGNOSIS — R110 Nausea: Secondary | ICD-10-CM | POA: Diagnosis not present

## 2021-09-06 MED ORDER — PEG 3350-KCL-NA BICARB-NACL 420 G PO SOLR: ORAL | 0 refills | Status: DC

## 2021-09-06 NOTE — Telephone Encounter (Signed)
Called pt. She has been scheduled for TCS/EGD, Dr. Abbey Chatters, ASA 3 on 7/13 at 12:15pm. Aware will mail prep instructions/pre-op. Rx for prep sent to pharmacy.   PA submitted via [employer]. Auth# [id number], DOS: Oct 05, 2021 - Jan 03, 2022

## 2021-09-06 NOTE — H&P (View-Only) (Signed)
Primary Care Physician:  Caryl Bis, MD Primary Gastroenterologist:  Dr. Abbey Chatters  Chief Complaint  Patient presents with   Abdominal Pain    Patient here today with complaints of upper abdominal pain. She has a lot of gas/burping, she has some issues with reflux also. She is on Pantoprazole 40 mg one bid, famotidine 40 mg one bid. Cardiologist states not cardiac related upper chest/abdominal pain.    HPI:   [name] is a 66 y.o. female who presents to the clinic today by referral from her PCP Dr. Quillian Quince for evaluation.  Patient has chronic GERD and reflux which is controlled on pantoprazole and famotidine twice daily.  She states she intermittently has breakthrough symptoms in regards to this.  Has had issues with abdominal pain and GERD for many years.  She states approximately 2 months ago while sitting in a recliner had sudden onset of chest and epigastric pain.  Radiated to left and right into her back.  Also noted right arm numbness as well as nausea.  She was very fatigued and weak the next day.  She was seen by cardiology who felt symptoms were not cardiac related.  Recommended further evaluation with GI.  Denies any dysphagia odynophagia.  No melena hematochezia.  Last colonoscopy she states was 5 years ago.  Polyps removed.  Family history of colon cancer in her father.  I do not have access to this report however.  Past Medical History:  Diagnosis Date   Arthritis    Asthma    Cancer ([place])    cervical , skin   Cold    recent  rx   Fibromyalgia    GERD (gastroesophageal reflux disease)    H/O hiatal hernia    Hypertension    PONV (postoperative nausea and vomiting)    Shortness of breath    Sleep apnea    ' mild " does not use cpap   Tarsal tunnel syndrome of both lower extremities     Past Surgical History:  Procedure Laterality Date   ABDOMINAL HYSTERECTOMY  2004   APPENDECTOMY     BACK SURGERY     x3   BACK SURGERY  02/2020   rod placed in  back   CERVICAL CONIZATION W/BX     [place]  2006   CHOLECYSTECTOMY  2003   LUMBAR FUSION  04/07/2013   L 2 L3 L4    with rod     DR NITKA   TONSILLECTOMY      Current Outpatient Medications  Medication Sig Dispense Refill   acetaminophen (TYLENOL) 650 MG CR tablet Take 1,300 mg by mouth every 8 (eight) hours as needed for pain.     albuterol (VENTOLIN HFA) 108 (90 Base) MCG/ACT inhaler Inhale 1 puff into the lungs every 6 (six) hours as needed for wheezing or shortness of breath.     celecoxib (CELEBREX) 200 MG capsule Take 200 mg by mouth daily.     cetirizine (ZYRTEC) 10 MG tablet Take 10 mg by mouth daily.     clonazePAM (KLONOPIN) 0.5 MG tablet Take 0.5 mg by mouth at bedtime.     EPINEPHrine 0.3 mg/0.3 mL IJ SOAJ injection Inject 0.3 mg into the muscle as needed for anaphylaxis.     estradiol (ESTRACE) 1 MG tablet Take 1 mg by mouth daily.     famotidine (PEPCID) 40 MG tablet Take 40 mg by mouth 2 (two) times daily.     fexofenadine (ALLEGRA)  180 MG tablet Take 180 mg by mouth daily.     Metoprolol-Hydrochlorothiazide 50-12.5 MG TB24 Take 0.5 tablets by mouth daily.      pantoprazole (PROTONIX) 40 MG tablet Take 40 mg by mouth 2 (two) times daily.     Probiotic Product (PROBIOTIC DAILY PO) Take 1 capsule by mouth daily.     No current facility-administered medications for this visit.    Allergies as of 09/06/2021 - Review Complete 09/06/2021  Allergen Reaction Noted   Cephalosporins Nausea And Vomiting 07/30/2014   Codeine Hives and Palpitations 03/24/2013   Doxycycline Nausea And Vomiting 04/26/2014   Novocain [procaine] Swelling 07/30/2014   Other Nausea And Vomiting 07/30/2014   Statins Other (See Comments) 08/04/2014   Sulfa antibiotics Hives and Nausea And Vomiting 67/20/9470   Tricyclic antidepressants Nausea And Vomiting 04/26/2014   Ace inhibitors Cough 08/04/2014   Asa [aspirin] Nausea And Vomiting 03/24/2013   Morphine and related  03/24/2013    Ultram [tramadol] Other (See Comments) 08/04/2014   Zetia [ezetimibe] Other (See Comments) 07/30/2014    Family History  Problem Relation Age of Onset   Allergic rhinitis Neg Hx    Angioedema Neg Hx    Asthma Neg Hx    Atopy Neg Hx    Eczema Neg Hx    Immunodeficiency Neg Hx    Urticaria Neg Hx     Social History   Socioeconomic History   Marital status: Married    Spouse name: Not on file   Number of children: 1   Years of education: Not on file   Highest education level: 11th grade  Occupational History   Not on file  Tobacco Use   Smoking status: Never   Smokeless tobacco: Never  Vaping Use   Vaping Use: Never used  Substance and Sexual Activity   Alcohol use: No   Drug use: No   Sexual activity: Yes    Birth control/protection: Surgical  Other Topics Concern   Not on file  Social History Narrative   Not on file   Social Determinants of Health   Financial Resource Strain: Not on file  Food Insecurity: Not on file  Transportation Needs: Not on file  Physical Activity: Not on file  Stress: Not on file  Social Connections: Not on file  Intimate Partner Violence: Not on file    Subjective: Review of Systems  Constitutional:  Negative for chills and fever.  HENT:  Negative for congestion and hearing loss.   Eyes:  Negative for blurred vision and double vision.  Respiratory:  Negative for cough and shortness of breath.   Cardiovascular:  Negative for chest pain and palpitations.  Gastrointestinal:  Positive for abdominal pain, heartburn and nausea. Negative for blood in stool, constipation, diarrhea, melena and vomiting.  Genitourinary:  Negative for dysuria and urgency.  Musculoskeletal:  Negative for joint pain and myalgias.  Skin:  Negative for itching and rash.  Neurological:  Negative for dizziness and headaches.  Psychiatric/Behavioral:  Negative for depression. The patient is not nervous/anxious.        Objective: BP 126/82 (BP Location: Left  Arm, Patient Position: Sitting, Cuff Size: Large)   Pulse 74   Temp 97.7 F (36.5 C) (Oral)   Ht '5\' 7"'$  (1.702 m)   Wt 165 lb 4.8 oz (75 kg)   BMI 25.89 kg/m  Physical Exam Constitutional:      Appearance: Normal appearance.  HENT:     Head: Normocephalic and atraumatic.  Eyes:  Extraocular Movements: Extraocular movements intact.     Conjunctiva/sclera: Conjunctivae normal.  Cardiovascular:     Rate and Rhythm: Normal rate and regular rhythm.  Pulmonary:     Effort: Pulmonary effort is normal.     Breath sounds: Normal breath sounds.  Abdominal:     General: Bowel sounds are normal.     Palpations: Abdomen is soft.  Musculoskeletal:        General: No swelling. Normal range of motion.     Cervical back: Normal range of motion and neck supple.  Skin:    General: Skin is warm and dry.     Coloration: Skin is not jaundiced.  Neurological:     General: No focal deficit present.     Mental Status: She is alert and oriented to person, place, and time.  Psychiatric:        Mood and Affect: Mood normal.        Behavior: Behavior normal.      Assessment: *Chronic GERD *Epigastric pain *History of adenomatous colon polyp *Family history of colon cancer in father  Plan: Etiology of patient's symptoms unclear.  Possibly related to her chronic GERD.  Will schedule for EGD to evaluate for peptic ulcer disease, esophagitis, gastritis, H. Pylori, duodenitis, or other. Will also evaluate for esophageal stricture, Schatzki's ring, esophageal web or other.   At the same time we will perform colonoscopy for surveillance purposes and family history of colon cancer.  The risks including infection, bleed, or perforation as well as benefits, limitations, alternatives and imponderables have been reviewed with the patient. Potential for esophageal dilation, biopsy, etc. have also been reviewed.  Questions have been answered. All parties agreeable.  Continue on pantoprazole and  famotidine.  Further recommendations to follow  Thank you Dr. Wolfgang Phoenix for the kind referral.  09/06/2021 11:50 AM   Disclaimer: This note was dictated with voice recognition software. Similar sounding words can inadvertently be transcribed and may not be corrected upon review.

## 2021-09-06 NOTE — Patient Instructions (Signed)
We will schedule you for upper endoscopy to further evaluate your epigastric pain, nausea, chronic reflux.  At the same time we will perform colonoscopy given your history of polyps and family history of colon cancer.  Continue on pantoprazole and famotidine.  Further recommendations to follow.  It was very nice meeting both of you today.  Dr. Abbey Chatters

## 2021-09-06 NOTE — Progress Notes (Signed)
Primary Care Physician:  Caryl Bis, MD Primary Gastroenterologist:  Dr. Abbey Chatters  Chief Complaint  Patient presents with   Abdominal Pain    Patient here today with complaints of upper abdominal pain. She has a lot of gas/burping, she has some issues with reflux also. She is on Pantoprazole 40 mg one bid, famotidine 40 mg one bid. Cardiologist states not cardiac related upper chest/abdominal pain.    HPI:   [name] is a 66 y.o. female who presents to the clinic today by referral from her PCP Dr. Quillian Quince for evaluation.  Patient has chronic GERD and reflux which is controlled on pantoprazole and famotidine twice daily.  She states she intermittently has breakthrough symptoms in regards to this.  Has had issues with abdominal pain and GERD for many years.  She states approximately 2 months ago while sitting in a recliner had sudden onset of chest and epigastric pain.  Radiated to left and right into her back.  Also noted right arm numbness as well as nausea.  She was very fatigued and weak the next day.  She was seen by cardiology who felt symptoms were not cardiac related.  Recommended further evaluation with GI.  Denies any dysphagia odynophagia.  No melena hematochezia.  Last colonoscopy she states was 5 years ago.  Polyps removed.  Family history of colon cancer in her father.  I do not have access to this report however.  Past Medical History:  Diagnosis Date   Arthritis    Asthma    Cancer ([place])    cervical , skin   Cold    recent  rx   Fibromyalgia    GERD (gastroesophageal reflux disease)    H/O hiatal hernia    Hypertension    PONV (postoperative nausea and vomiting)    Shortness of breath    Sleep apnea    ' mild " does not use cpap   Tarsal tunnel syndrome of both lower extremities     Past Surgical History:  Procedure Laterality Date   ABDOMINAL HYSTERECTOMY  2004   APPENDECTOMY     BACK SURGERY     x3   BACK SURGERY  02/2020   rod placed in  back   CERVICAL CONIZATION W/BX     [place]  2006   CHOLECYSTECTOMY  2003   LUMBAR FUSION  04/07/2013   L 2 L3 L4    with rod     DR NITKA   TONSILLECTOMY      Current Outpatient Medications  Medication Sig Dispense Refill   acetaminophen (TYLENOL) 650 MG CR tablet Take 1,300 mg by mouth every 8 (eight) hours as needed for pain.     albuterol (VENTOLIN HFA) 108 (90 Base) MCG/ACT inhaler Inhale 1 puff into the lungs every 6 (six) hours as needed for wheezing or shortness of breath.     celecoxib (CELEBREX) 200 MG capsule Take 200 mg by mouth daily.     cetirizine (ZYRTEC) 10 MG tablet Take 10 mg by mouth daily.     clonazePAM (KLONOPIN) 0.5 MG tablet Take 0.5 mg by mouth at bedtime.     EPINEPHrine 0.3 mg/0.3 mL IJ SOAJ injection Inject 0.3 mg into the muscle as needed for anaphylaxis.     estradiol (ESTRACE) 1 MG tablet Take 1 mg by mouth daily.     famotidine (PEPCID) 40 MG tablet Take 40 mg by mouth 2 (two) times daily.     fexofenadine (ALLEGRA)  180 MG tablet Take 180 mg by mouth daily.     Metoprolol-Hydrochlorothiazide 50-12.5 MG TB24 Take 0.5 tablets by mouth daily.      pantoprazole (PROTONIX) 40 MG tablet Take 40 mg by mouth 2 (two) times daily.     Probiotic Product (PROBIOTIC DAILY PO) Take 1 capsule by mouth daily.     No current facility-administered medications for this visit.    Allergies as of 09/06/2021 - Review Complete 09/06/2021  Allergen Reaction Noted   Cephalosporins Nausea And Vomiting 07/30/2014   Codeine Hives and Palpitations 03/24/2013   Doxycycline Nausea And Vomiting 04/26/2014   Novocain [procaine] Swelling 07/30/2014   Other Nausea And Vomiting 07/30/2014   Statins Other (See Comments) 08/04/2014   Sulfa antibiotics Hives and Nausea And Vomiting 02/72/5366   Tricyclic antidepressants Nausea And Vomiting 04/26/2014   Ace inhibitors Cough 08/04/2014   Asa [aspirin] Nausea And Vomiting 03/24/2013   Morphine and related  03/24/2013    Ultram [tramadol] Other (See Comments) 08/04/2014   Zetia [ezetimibe] Other (See Comments) 07/30/2014    Family History  Problem Relation Age of Onset   Allergic rhinitis Neg Hx    Angioedema Neg Hx    Asthma Neg Hx    Atopy Neg Hx    Eczema Neg Hx    Immunodeficiency Neg Hx    Urticaria Neg Hx     Social History   Socioeconomic History   Marital status: Married    Spouse name: Not on file   Number of children: 1   Years of education: Not on file   Highest education level: 11th grade  Occupational History   Not on file  Tobacco Use   Smoking status: Never   Smokeless tobacco: Never  Vaping Use   Vaping Use: Never used  Substance and Sexual Activity   Alcohol use: No   Drug use: No   Sexual activity: Yes    Birth control/protection: Surgical  Other Topics Concern   Not on file  Social History Narrative   Not on file   Social Determinants of Health   Financial Resource Strain: Not on file  Food Insecurity: Not on file  Transportation Needs: Not on file  Physical Activity: Not on file  Stress: Not on file  Social Connections: Not on file  Intimate Partner Violence: Not on file    Subjective: Review of Systems  Constitutional:  Negative for chills and fever.  HENT:  Negative for congestion and hearing loss.   Eyes:  Negative for blurred vision and double vision.  Respiratory:  Negative for cough and shortness of breath.   Cardiovascular:  Negative for chest pain and palpitations.  Gastrointestinal:  Positive for abdominal pain, heartburn and nausea. Negative for blood in stool, constipation, diarrhea, melena and vomiting.  Genitourinary:  Negative for dysuria and urgency.  Musculoskeletal:  Negative for joint pain and myalgias.  Skin:  Negative for itching and rash.  Neurological:  Negative for dizziness and headaches.  Psychiatric/Behavioral:  Negative for depression. The patient is not nervous/anxious.        Objective: BP 126/82 (BP Location: Left  Arm, Patient Position: Sitting, Cuff Size: Large)   Pulse 74   Temp 97.7 F (36.5 C) (Oral)   Ht '5\' 7"'$  (1.702 m)   Wt 165 lb 4.8 oz (75 kg)   BMI 25.89 kg/m  Physical Exam Constitutional:      Appearance: Normal appearance.  HENT:     Head: Normocephalic and atraumatic.  Eyes:  Extraocular Movements: Extraocular movements intact.     Conjunctiva/sclera: Conjunctivae normal.  Cardiovascular:     Rate and Rhythm: Normal rate and regular rhythm.  Pulmonary:     Effort: Pulmonary effort is normal.     Breath sounds: Normal breath sounds.  Abdominal:     General: Bowel sounds are normal.     Palpations: Abdomen is soft.  Musculoskeletal:        General: No swelling. Normal range of motion.     Cervical back: Normal range of motion and neck supple.  Skin:    General: Skin is warm and dry.     Coloration: Skin is not jaundiced.  Neurological:     General: No focal deficit present.     Mental Status: She is alert and oriented to person, place, and time.  Psychiatric:        Mood and Affect: Mood normal.        Behavior: Behavior normal.      Assessment: *Chronic GERD *Epigastric pain *History of adenomatous colon polyp *Family history of colon cancer in father  Plan: Etiology of patient's symptoms unclear.  Possibly related to her chronic GERD.  Will schedule for EGD to evaluate for peptic ulcer disease, esophagitis, gastritis, H. Pylori, duodenitis, or other. Will also evaluate for esophageal stricture, Schatzki's ring, esophageal web or other.   At the same time we will perform colonoscopy for surveillance purposes and family history of colon cancer.  The risks including infection, bleed, or perforation as well as benefits, limitations, alternatives and imponderables have been reviewed with the patient. Potential for esophageal dilation, biopsy, etc. have also been reviewed.  Questions have been answered. All parties agreeable.  Continue on pantoprazole and  famotidine.  Further recommendations to follow  Thank you Dr. Wolfgang Phoenix for the kind referral.  09/06/2021 11:50 AM   Disclaimer: This note was dictated with voice recognition software. Similar sounding words can inadvertently be transcribed and may not be corrected upon review.

## 2021-09-07 DIAGNOSIS — E782 Mixed hyperlipidemia: Secondary | ICD-10-CM | POA: Diagnosis not present

## 2021-09-07 DIAGNOSIS — Z1329 Encounter for screening for other suspected endocrine disorder: Secondary | ICD-10-CM | POA: Diagnosis not present

## 2021-09-07 DIAGNOSIS — I10 Essential (primary) hypertension: Secondary | ICD-10-CM | POA: Diagnosis not present

## 2021-09-07 DIAGNOSIS — E7849 Other hyperlipidemia: Secondary | ICD-10-CM | POA: Diagnosis not present

## 2021-09-07 DIAGNOSIS — K219 Gastro-esophageal reflux disease without esophagitis: Secondary | ICD-10-CM | POA: Diagnosis not present

## 2021-09-14 DIAGNOSIS — K2100 Gastro-esophageal reflux disease with esophagitis, without bleeding: Secondary | ICD-10-CM | POA: Diagnosis not present

## 2021-09-14 DIAGNOSIS — R4582 Worries: Secondary | ICD-10-CM | POA: Diagnosis not present

## 2021-09-14 DIAGNOSIS — M545 Low back pain, unspecified: Secondary | ICD-10-CM | POA: Diagnosis not present

## 2021-09-14 DIAGNOSIS — G43909 Migraine, unspecified, not intractable, without status migrainosus: Secondary | ICD-10-CM | POA: Diagnosis not present

## 2021-09-14 DIAGNOSIS — J301 Allergic rhinitis due to pollen: Secondary | ICD-10-CM | POA: Diagnosis not present

## 2021-09-14 DIAGNOSIS — E7849 Other hyperlipidemia: Secondary | ICD-10-CM | POA: Diagnosis not present

## 2021-09-14 DIAGNOSIS — E876 Hypokalemia: Secondary | ICD-10-CM | POA: Diagnosis not present

## 2021-09-14 DIAGNOSIS — I10 Essential (primary) hypertension: Secondary | ICD-10-CM | POA: Diagnosis not present

## 2021-09-14 DIAGNOSIS — M797 Fibromyalgia: Secondary | ICD-10-CM | POA: Diagnosis not present

## 2021-09-15 ENCOUNTER — Encounter: Payer: Self-pay | Admitting: Specialist

## 2021-09-15 ENCOUNTER — Ambulatory Visit: Payer: Medicare Other | Admitting: Specialist

## 2021-09-15 ENCOUNTER — Ambulatory Visit (INDEPENDENT_AMBULATORY_CARE_PROVIDER_SITE_OTHER): Payer: Medicare Other

## 2021-09-15 VITALS — BP 122/82 | HR 71 | Ht 67.0 in | Wt 167.0 lb

## 2021-09-15 DIAGNOSIS — M533 Sacrococcygeal disorders, not elsewhere classified: Secondary | ICD-10-CM | POA: Diagnosis not present

## 2021-09-15 DIAGNOSIS — G8929 Other chronic pain: Secondary | ICD-10-CM

## 2021-09-15 DIAGNOSIS — M48062 Spinal stenosis, lumbar region with neurogenic claudication: Secondary | ICD-10-CM

## 2021-09-15 DIAGNOSIS — M47814 Spondylosis without myelopathy or radiculopathy, thoracic region: Secondary | ICD-10-CM | POA: Diagnosis not present

## 2021-09-15 DIAGNOSIS — M4325 Fusion of spine, thoracolumbar region: Secondary | ICD-10-CM | POA: Diagnosis not present

## 2021-10-02 NOTE — Patient Instructions (Signed)
[name]  10/02/2021     '@PREFPERIOPPHARMACY'$ @   Your procedure is scheduled on  10/05/2021.   Report to [facility] at  1015  A.M.   Call this number if you have problems the morning of surgery:  [phone]   Remember:  Follow the diet and prep instructions given to you by the office.     Use your inhaler before you come and bring your rescue inhaler with you.    Take these medicines the morning of surgery with A SIP OF WATER      lioresal (if needed), celebreax, zyrtec, pepcid, metoprolol, protonix.    Do not wear jewelry, make-up or nail polish.  Do not wear lotions, powders, or perfumes, or deodorant.  Do not shave 48 hours prior to surgery.  Men may shave face and neck.  Do not bring valuables to the hospital.  [facility] is not responsible for any belongings or valuables.  Contacts, dentures or bridgework may not be worn into surgery.  Leave your suitcase in the car.  After surgery it may be brought to your room.  For patients admitted to the hospital, discharge time will be determined by your treatment team.  Patients discharged the day of surgery will not be allowed to drive home and must have someone with them for 24 hours.    Special instructions:   DO NOT smoke tobacco or vape for 24 hours before your procedure.  Please read over the following fact sheets that you were given. Anesthesia Post-op Instructions and Care and Recovery After Surgery      Upper Endoscopy, Adult, Care After This sheet gives you information about how to care for yourself after your procedure. Your health care provider may also give you more specific instructions. If you have problems or questions, contact your health care provider. What can I expect after the procedure? After the procedure, it is common to have: A sore throat. Mild stomach pain or discomfort. Bloating. Nausea. Follow these instructions at home:  Follow instructions from your health care  provider about what to eat or drink after your procedure. Return to your normal activities as told by your health care provider. Ask your health care provider what activities are safe for you. Take over-the-counter and prescription medicines only as told by your health care provider. If you were given a sedative during the procedure, it can affect you for several hours. Do not drive or operate machinery until your health care provider says that it is safe. Keep all follow-up visits as told by your health care provider. This is important. Contact a health care provider if you have: A sore throat that lasts longer than one day. Trouble swallowing. Get help right away if: You vomit blood or your vomit looks like coffee grounds. You have: A fever. Bloody, black, or tarry stools. A severe sore throat or you cannot swallow. Difficulty breathing. Severe pain in your chest or abdomen. Summary After the procedure, it is common to have a sore throat, mild stomach discomfort, bloating, and nausea. If you were given a sedative during the procedure, it can affect you for several hours. Do not drive or operate machinery until your health care provider says that it is safe. Follow instructions from your health care provider about what to eat or drink after your procedure. Return to your normal activities as told by your health care provider. This information is not intended to replace advice given  to you by your health care provider. Make sure you discuss any questions you have with your health care provider. Document Revised: 01/16/2019 Document Reviewed: 08/12/2017 Elsevier Patient Education  [place]. Colonoscopy, Adult, Care After The following information offers guidance on how to care for yourself after your procedure. Your health care provider may also give you more specific instructions. If you have problems or questions, contact your health care provider. What can I expect after the  procedure? After the procedure, it is common to have: A small amount of blood in your stool for 24 hours after the procedure. Some gas. Mild cramping or bloating of your abdomen. Follow these instructions at home: Eating and drinking  Drink enough fluid to keep your urine pale yellow. Follow instructions from your health care provider about eating or drinking restrictions. Resume your normal diet as told by your health care provider. Avoid heavy or fried foods that are hard to digest. Activity Rest as told by your health care provider. Avoid sitting for a long time without moving. Get up to take short walks every 1-2 hours. This is important to improve blood flow and breathing. Ask for help if you feel weak or unsteady. Return to your normal activities as told by your health care provider. Ask your health care provider what activities are safe for you. Managing cramping and bloating  Try walking around when you have cramps or feel bloated. If directed, apply heat to your abdomen as told by your health care provider. Use the heat source that your health care provider recommends, such as a moist heat pack or a heating pad. Place a towel between your skin and the heat source. Leave the heat on for 20-30 minutes. Remove the heat if your skin turns bright red. This is especially important if you are unable to feel pain, heat, or cold. You have a greater risk of getting burned. General instructions If you were given a sedative during the procedure, it can affect you for several hours. Do not drive or operate machinery until your health care provider says that it is safe. For the first 24 hours after the procedure: Do not sign important documents. Do not drink alcohol. Do your regular daily activities at a slower pace than normal. Eat soft foods that are easy to digest. Take over-the-counter and prescription medicines only as told by your health care provider. Keep all follow-up visits. This  is important. Contact a health care provider if: You have blood in your stool 2-3 days after the procedure. Get help right away if: You have more than a small spotting of blood in your stool. You have large blood clots in your stool. You have swelling of your abdomen. You have nausea or vomiting. You have a fever. You have increasing pain in your abdomen that is not relieved with medicine. These symptoms may be an emergency. Get help right away. Call 911. Do not wait to see if the symptoms will go away. Do not drive yourself to the hospital. Summary After the procedure, it is common to have a small amount of blood in your stool. You may also have mild cramping and bloating of your abdomen. If you were given a sedative during the procedure, it can affect you for several hours. Do not drive or operate machinery until your health care provider says that it is safe. Get help right away if you have a lot of blood in your stool, nausea or vomiting, a fever, or increased pain  in your abdomen. This information is not intended to replace advice given to you by your health care provider. Make sure you discuss any questions you have with your health care provider. Document Revised: 11/02/2020 Document Reviewed: 11/02/2020 Elsevier Patient Education  [place] After This sheet gives you information about how to care for yourself after your procedure. Your health care provider may also give you more specific instructions. If you have problems or questions, contact your health care provider. What can I expect after the procedure? After the procedure, it is common to have: Tiredness. Forgetfulness about what happened after the procedure. Impaired judgment for important decisions. Nausea or vomiting. Some difficulty with balance. Follow these instructions at home: For the time period you were told by your health care provider:     Rest as needed. Do not  participate in activities where you could fall or become injured. Do not drive or use machinery. Do not drink alcohol. Do not take sleeping pills or medicines that cause drowsiness. Do not make important decisions or sign legal documents. Do not take care of children on your own. Eating and drinking Follow the diet that is recommended by your health care provider. Drink enough fluid to keep your urine pale yellow. If you vomit: Drink water, juice, or soup when you can drink without vomiting. Make sure you have little or no nausea before eating solid foods. General instructions Have a responsible adult stay with you for the time you are told. It is important to have someone help care for you until you are awake and alert. Take over-the-counter and prescription medicines only as told by your health care provider. If you have sleep apnea, surgery and certain medicines can increase your risk for breathing problems. Follow instructions from your health care provider about wearing your sleep device: Anytime you are sleeping, including during daytime naps. While taking prescription pain medicines, sleeping medicines, or medicines that make you drowsy. Avoid smoking. Keep all follow-up visits as told by your health care provider. This is important. Contact a health care provider if: You keep feeling nauseous or you keep vomiting. You feel light-headed. You are still sleepy or having trouble with balance after 24 hours. You develop a rash. You have a fever. You have redness or swelling around the IV site. Get help right away if: You have trouble breathing. You have new-onset confusion at home. Summary For several hours after your procedure, you may feel tired. You may also be forgetful and have poor judgment. Have a responsible adult stay with you for the time you are told. It is important to have someone help care for you until you are awake and alert. Rest as told. Do not drive or operate  machinery. Do not drink alcohol or take sleeping pills. Get help right away if you have trouble breathing, or if you suddenly become confused. This information is not intended to replace advice given to you by your health care provider. Make sure you discuss any questions you have with your health care provider. Document Revised: 02/14/2021 Document Reviewed: 02/12/2019 Elsevier Patient Education  [place].

## 2021-10-03 ENCOUNTER — Encounter (HOSPITAL_COMMUNITY)
Admission: RE | Admit: 2021-10-03 | Discharge: 2021-10-03 | Disposition: A | Discharge status: Home / Self Care | Payer: Medicare Other | Source: Ambulatory Visit | Attending: Internal Medicine | Admitting: Internal Medicine

## 2021-10-03 ENCOUNTER — Other Ambulatory Visit: Payer: Self-pay

## 2021-10-03 ENCOUNTER — Encounter (HOSPITAL_COMMUNITY): Payer: Self-pay

## 2021-10-05 ENCOUNTER — Ambulatory Visit (HOSPITAL_COMMUNITY)
Admission: RE | Admit: 2021-10-05 | Discharge: 2021-10-05 | Disposition: A | Discharge status: Home / Self Care | Payer: Medicare Other | Source: Ambulatory Visit | Attending: Internal Medicine | Admitting: Internal Medicine

## 2021-10-05 ENCOUNTER — Ambulatory Visit (HOSPITAL_COMMUNITY): Payer: Medicare Other | Admitting: Anesthesiology

## 2021-10-05 ENCOUNTER — Encounter (HOSPITAL_COMMUNITY): Payer: Self-pay

## 2021-10-05 ENCOUNTER — Encounter (HOSPITAL_COMMUNITY)
Admission: RE | Disposition: A | Discharge status: Home / Self Care | Payer: Self-pay | Source: Ambulatory Visit | Attending: Internal Medicine

## 2021-10-05 ENCOUNTER — Ambulatory Visit (HOSPITAL_BASED_OUTPATIENT_CLINIC_OR_DEPARTMENT_OTHER): Payer: Medicare Other | Admitting: Anesthesiology

## 2021-10-05 DIAGNOSIS — R1013 Epigastric pain: Secondary | ICD-10-CM | POA: Diagnosis not present

## 2021-10-05 DIAGNOSIS — K2970 Gastritis, unspecified, without bleeding: Secondary | ICD-10-CM | POA: Insufficient documentation

## 2021-10-05 DIAGNOSIS — Z86010 Personal history of colonic polyps: Secondary | ICD-10-CM | POA: Diagnosis not present

## 2021-10-05 DIAGNOSIS — Z09 Encounter for follow-up examination after completed treatment for conditions other than malignant neoplasm: Secondary | ICD-10-CM

## 2021-10-05 DIAGNOSIS — Z800 Family history of malignant neoplasm of digestive organs: Secondary | ICD-10-CM | POA: Diagnosis not present

## 2021-10-05 DIAGNOSIS — K648 Other hemorrhoids: Secondary | ICD-10-CM | POA: Insufficient documentation

## 2021-10-05 DIAGNOSIS — D122 Benign neoplasm of ascending colon: Secondary | ICD-10-CM | POA: Diagnosis not present

## 2021-10-05 DIAGNOSIS — K317 Polyp of stomach and duodenum: Secondary | ICD-10-CM

## 2021-10-05 DIAGNOSIS — G4730 Sleep apnea, unspecified: Secondary | ICD-10-CM | POA: Insufficient documentation

## 2021-10-05 DIAGNOSIS — M797 Fibromyalgia: Secondary | ICD-10-CM | POA: Diagnosis not present

## 2021-10-05 DIAGNOSIS — K449 Diaphragmatic hernia without obstruction or gangrene: Secondary | ICD-10-CM | POA: Diagnosis not present

## 2021-10-05 DIAGNOSIS — R12 Heartburn: Secondary | ICD-10-CM | POA: Diagnosis not present

## 2021-10-05 DIAGNOSIS — I10 Essential (primary) hypertension: Secondary | ICD-10-CM | POA: Diagnosis not present

## 2021-10-05 DIAGNOSIS — D124 Benign neoplasm of descending colon: Secondary | ICD-10-CM | POA: Insufficient documentation

## 2021-10-05 DIAGNOSIS — K635 Polyp of colon: Secondary | ICD-10-CM | POA: Diagnosis not present

## 2021-10-05 DIAGNOSIS — M1990 Unspecified osteoarthritis, unspecified site: Secondary | ICD-10-CM | POA: Insufficient documentation

## 2021-10-05 DIAGNOSIS — J45909 Unspecified asthma, uncomplicated: Secondary | ICD-10-CM | POA: Insufficient documentation

## 2021-10-05 DIAGNOSIS — Z1211 Encounter for screening for malignant neoplasm of colon: Secondary | ICD-10-CM | POA: Insufficient documentation

## 2021-10-05 DIAGNOSIS — K219 Gastro-esophageal reflux disease without esophagitis: Secondary | ICD-10-CM | POA: Insufficient documentation

## 2021-10-05 HISTORY — PX: ESOPHAGOGASTRODUODENOSCOPY (EGD) WITH PROPOFOL: SHX5813

## 2021-10-05 HISTORY — PX: COLONOSCOPY WITH PROPOFOL: SHX5780

## 2021-10-05 HISTORY — PX: POLYPECTOMY: SHX5525

## 2021-10-05 HISTORY — PX: BIOPSY: SHX5522

## 2021-10-05 SURGERY — COLONOSCOPY WITH PROPOFOL
Anesthesia: General

## 2021-10-05 MED ORDER — PROPOFOL 500 MG/50ML IV EMUL
INTRAVENOUS | Status: DC | PRN
  Administered 2021-10-05: 125 ug/kg/min via INTRAVENOUS

## 2021-10-05 MED ORDER — LIDOCAINE HCL (CARDIAC) PF 100 MG/5ML IV SOSY
PREFILLED_SYRINGE | INTRAVENOUS | Status: DC | PRN
  Administered 2021-10-05: 60 mg via INTRATRACHEAL

## 2021-10-05 MED ORDER — LACTATED RINGERS IV SOLN: INTRAVENOUS | Status: DC | PRN

## 2021-10-05 MED ORDER — PROPOFOL 10 MG/ML IV BOLUS
INTRAVENOUS | Status: DC | PRN
  Administered 2021-10-05: 70 mg via INTRAVENOUS
  Administered 2021-10-05: 30 mg via INTRAVENOUS

## 2021-10-05 NOTE — Anesthesia Postprocedure Evaluation (Signed)
Anesthesia Post Note  Patient: [name]  Procedure(s) Performed: COLONOSCOPY WITH PROPOFOL ESOPHAGOGASTRODUODENOSCOPY (EGD) WITH PROPOFOL POLYPECTOMY BIOPSY  Patient location during evaluation: Phase II Anesthesia Type: General Level of consciousness: awake and alert Pain management: pain level controlled Vital Signs Assessment: post-procedure vital signs reviewed and stable Respiratory status: spontaneous breathing, nonlabored ventilation, respiratory function stable and patient connected to nasal cannula oxygen Cardiovascular status: blood pressure returned to baseline and stable Postop Assessment: no apparent nausea or vomiting Anesthetic complications: no   There were no known notable events for this encounter.   Last Vitals:  Vitals:   10/05/21 1036 10/05/21 1119  BP: 125/72 107/66  Pulse:  68  Resp: 17 18  Temp: 36.5 C (!) 36.3 C  SpO2: 100% 98%    Last Pain:  Vitals:   10/05/21 1119  TempSrc: Oral  PainSc: 0-No pain                 Trixie Rude

## 2021-10-05 NOTE — Op Note (Signed)
[facility] Patient Name: [name] Procedure Date: 10/05/2021 10:25 AM MRN: [id number] Date of Birth: 09-07-1955 Attending MD: Elon Alas. Abbey Chatters DO CSN: [id number] Age: 66 Admit Type: Outpatient Procedure:                Upper GI endoscopy Indications:              Epigastric abdominal pain, Heartburn Providers:                Elon Alas. Abbey Chatters, DO, Crystal Page, Randa Spike, Technician, Everardo Pacific Referring MD:              Medicines:                See the Anesthesia note for documentation of the                            administered medications Complications:            No immediate complications. Estimated Blood Loss:     Estimated blood loss was minimal. Procedure:                Pre-Anesthesia Assessment:                           - The anesthesia plan was to use monitored                            anesthesia care (MAC).                           After obtaining informed consent, the endoscope was                            passed under direct vision. Throughout the                            procedure, the patient's blood pressure, pulse, and                            oxygen saturations were monitored continuously. The                            [id number]-H190 ([id number]) scope was introduced through the                            mouth, and advanced to the second part of duodenum.                            The upper GI endoscopy was accomplished without                            difficulty. The patient tolerated the procedure                            well. Scope In:  10:50:50 AM Scope Out: 10:57:02 AM Total Procedure Duration: 0 hours 6 minutes 12 seconds  Findings:      There is no endoscopic evidence of bleeding, areas of erosion,       esophagitis, ulcerations or varices in the entire esophagus.      Patchy mild inflammation characterized by erythema was found in the       gastric body. Biopsies were taken with a cold  forceps for Helicobacter       pylori testing.      Multiple sessile polyps with no bleeding and no stigmata of recent       bleeding were found in the gastric fundus and in the gastric body.       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 2 polyps approx 1 cm each were removed with       a cold snare. Resection and retrieval were complete.      The duodenal bulb, first portion of the duodenum and second portion of       the duodenum were normal. Impression:               - Gastritis. Biopsied.                           - Multiple gastric polyps. Resected and retrieved.                           - Normal duodenal bulb, first portion of the                            duodenum and second portion of the duodenum. Moderate Sedation:      Per Anesthesia Care Recommendation:           - Patient has a contact number available for                            emergencies. The signs and symptoms of potential                            delayed complications were discussed with the                            patient. Return to normal activities tomorrow.                            Written discharge instructions were provided to the                            patient.                           - Resume previous diet.                           - Continue present medications.                           - Await pathology results.                           -  Use Protonix (pantoprazole) 40 mg PO BID.                           - Return to GI clinic in 6 months. Procedure Code(s):        --- Professional ---                           [phone], Esophagogastroduodenoscopy, flexible,                            transoral; with removal of tumor(s), polyp(s), or                            other lesion(s) by snare technique                           43239, 49, Esophagogastroduodenoscopy, flexible,                            transoral; with biopsy, single or multiple Diagnosis Code(s):        --- Professional ---                            K29.70, Gastritis, unspecified, without bleeding                           K31.7, Polyp of stomach and duodenum                           R10.13, Epigastric pain                           R12, Heartburn CPT copyright 2019 American Medical Association. All rights reserved. The codes documented in this report are preliminary and upon coder review may  be revised to meet current compliance requirements. Elon Alas. Abbey Chatters, DO [place] Abbey Chatters, DO 10/05/2021 11:01:11 AM This report has been signed electronically. Number of Addenda: 0

## 2021-10-05 NOTE — Op Note (Signed)
[facility] Patient Name: [name] Procedure Date: 10/05/2021 10:26 AM MRN: [id number] Date of Birth: Jun 20, 1955 Attending MD: Elon Alas. Abbey Chatters DO CSN: [id number] Age: 66 Admit Type: Outpatient Procedure:                Colonoscopy Indications:              Colon cancer screening in patient at increased                            risk: Colorectal cancer in brother, Surveillance:                            Personal history of adenomatous polyps on last                            colonoscopy > 5 years ago Providers:                Elon Alas. Abbey Chatters, DO, Crystal Page, Randa Spike, Technician, Everardo Pacific Referring MD:              Medicines:                See the Anesthesia note for documentation of the                            administered medications Complications:            No immediate complications. Estimated Blood Loss:     Estimated blood loss was minimal. Procedure:                Pre-Anesthesia Assessment:                           - The anesthesia plan was to use monitored                            anesthesia care (MAC).                           After obtaining informed consent, the colonoscope                            was passed under direct vision. Throughout the                            procedure, the patient's blood pressure, pulse, and                            oxygen saturations were monitored continuously. The                            PCF-HQ190L ([id number]) scope was introduced through                            the anus and advanced to the the  cecum, identified                            by appendiceal orifice and ileocecal valve. The                            colonoscopy was performed without difficulty. The                            patient tolerated the procedure well. The quality                            of the bowel preparation was evaluated using the                            BBPS [facility] Bowel  Preparation Scale) with scores                            of: Right Colon = 3, Transverse Colon = 3 and Left                            Colon = 3 (entire mucosa seen well with no residual                            staining, small fragments of stool or opaque                            liquid). The total BBPS score equals 9. Scope In: 11:02:03 AM Scope Out: 11:15:07 AM Scope Withdrawal Time: 0 hours 9 minutes 23 seconds  Total Procedure Duration: 0 hours 13 minutes 4 seconds  Findings:      The perianal and digital rectal examinations were normal.      Non-bleeding internal hemorrhoids were found during endoscopy.      Two sessile polyps were found in the ascending colon. The polyps were 3       to 5 mm in size. These polyps were removed with a cold snare. Resection       and retrieval were complete.      A 5 mm polyp was found in the descending colon. The polyp was sessile.       The polyp was removed with a cold snare. Resection and retrieval were       complete.      The exam was otherwise without abnormality. Impression:               - Non-bleeding internal hemorrhoids.                           - Two 3 to 5 mm polyps in the ascending colon,                            removed with a cold snare. Resected and retrieved.                           - One 5 mm polyp in the descending colon, removed  with a cold snare. Resected and retrieved.                           - The examination was otherwise normal. Moderate Sedation:      Per Anesthesia Care Recommendation:           - Patient has a contact number available for                            emergencies. The signs and symptoms of potential                            delayed complications were discussed with the                            patient. Return to normal activities tomorrow.                            Written discharge instructions were provided to the                            patient.                            - Resume previous diet.                           - Continue present medications.                           - Await pathology results.                           - Repeat colonoscopy in 5 years for surveillance.                           - Return to GI clinic in 4 months. Procedure Code(s):        --- Professional ---                           [phone], Colonoscopy, flexible; with removal of                            tumor(s), polyp(s), or other lesion(s) by snare                            technique Diagnosis Code(s):        --- Professional ---                           K63.5, Polyp of colon                           Z80.0, Family history of malignant neoplasm of                            digestive organs  Z86.010, Personal history of colonic polyps                           K64.8, Other hemorrhoids CPT copyright 2019 American Medical Association. All rights reserved. The codes documented in this report are preliminary and upon coder review may  be revised to meet current compliance requirements. Elon Alas. Abbey Chatters, DO [place] Abbey Chatters, DO 10/05/2021 11:27:07 AM This report has been signed electronically. Number of Addenda: 0

## 2021-10-05 NOTE — Discharge Instructions (Addendum)
EGD Discharge instructions Please read the instructions outlined below and refer to this sheet in the next few weeks. These discharge instructions provide you with general information on caring for yourself after you leave the hospital. Your doctor may also give you specific instructions. While your treatment has been planned according to the most current medical practices available, unavoidable complications occasionally occur. If you have any problems or questions after discharge, please call your doctor. ACTIVITY You may resume your regular activity but move at a slower pace for the next 24 hours.  Take frequent rest periods for the next 24 hours.  Walking will help expel (get rid of) the air and reduce the bloated feeling in your abdomen.  No driving for 24 hours (because of the anesthesia (medicine) used during the test).  You may shower.  Do not sign any important legal documents or operate any machinery for 24 hours (because of the anesthesia used during the test).  NUTRITION Drink plenty of fluids.  You may resume your normal diet.  Begin with a light meal and progress to your normal diet.  Avoid alcoholic beverages for 24 hours or as instructed by your caregiver.  MEDICATIONS You may resume your normal medications unless your caregiver tells you otherwise.  WHAT YOU CAN EXPECT TODAY You may experience abdominal discomfort such as a feeling of fullness or "gas" pains.  FOLLOW-UP Your doctor will discuss the results of your test with you.  SEEK IMMEDIATE MEDICAL ATTENTION IF ANY OF THE FOLLOWING OCCUR: Excessive nausea (feeling sick to your stomach) and/or vomiting.  Severe abdominal pain and distention (swelling).  Trouble swallowing.  Temperature over 101 F (37.8 C).  Rectal bleeding or vomiting of blood.     Colonoscopy Discharge Instructions  Read the instructions outlined below and refer to this sheet in the next few weeks. These discharge instructions provide you with  general information on caring for yourself after you leave the hospital. Your doctor may also give you specific instructions. While your treatment has been planned according to the most current medical practices available, unavoidable complications occasionally occur.   ACTIVITY You may resume your regular activity, but move at a slower pace for the next 24 hours.  Take frequent rest periods for the next 24 hours.  Walking will help get rid of the air and reduce the bloated feeling in your belly (abdomen).  No driving for 24 hours (because of the medicine (anesthesia) used during the test).   Do not sign any important legal documents or operate any machinery for 24 hours (because of the anesthesia used during the test).  NUTRITION Drink plenty of fluids.  You may resume your normal diet as instructed by your doctor.  Begin with a light meal and progress to your normal diet. Heavy or fried foods are harder to digest and may make you feel sick to your stomach (nauseated).  Avoid alcoholic beverages for 24 hours or as instructed.  MEDICATIONS You may resume your normal medications unless your doctor tells you otherwise.  WHAT YOU CAN EXPECT TODAY Some feelings of bloating in the abdomen.  Passage of more gas than usual.  Spotting of blood in your stool or on the toilet paper.  IF YOU HAD POLYPS REMOVED DURING THE COLONOSCOPY: No aspirin products for 7 days or as instructed.  No alcohol for 7 days or as instructed.  Eat a soft diet for the next 24 hours.  FINDING OUT THE RESULTS OF YOUR TEST Not all test results are  available during your visit. If your test results are not back during the visit, make an appointment with your caregiver to find out the results. Do not assume everything is normal if you have not heard from your caregiver or the medical facility. It is important for you to follow up on all of your test results.  SEEK IMMEDIATE MEDICAL ATTENTION IF: You have more than a spotting of  blood in your stool.  Your belly is swollen (abdominal distention).  You are nauseated or vomiting.  You have a temperature over 101.  You have abdominal pain or discomfort that is severe or gets worse throughout the day.   Your EGD revealed mild amount inflammation in your stomach.  I took biopsies of this to rule out infection with a bacteria called H. pylori.  Await pathology results, my office will contact you.  You also had a few polyps in your stomach.  These are likely benign fundic gland polyps.  I did remove the 2 largest ones.  Continue on pantoprazole.  Your esophagus and small bowel appeared normal.  Your colonoscopy revealed 3 polyp(s) which I removed successfully. Await pathology results, my office will contact you. I recommend repeating colonoscopy in 5 years for surveillance purposes.   Follow up with GI in 4 months  I hope you have a great rest of your week!  Elon Alas. Abbey Chatters, D.O. Gastroenterology and Hepatology [facility]

## 2021-10-05 NOTE — Interval H&P Note (Signed)
History and Physical Interval Note:  10/05/2021 10:18 AM  [name]  has presented today for surgery, with the diagnosis of EPIGASTRIC PAIN, NAUSEA, CHRONIC REFLUX, FH CRC, HX POLYPS.  The various methods of treatment have been discussed with the patient and family. After consideration of risks, benefits and other options for treatment, the patient has consented to  Procedure(s) with comments: COLONOSCOPY WITH PROPOFOL (N/A) - 12:15pm, asa 3 ESOPHAGOGASTRODUODENOSCOPY (EGD) WITH PROPOFOL (N/A) as a surgical intervention.  The patient's history has been reviewed, patient examined, no change in status, stable for surgery.  I have reviewed the patient's chart and labs.  Questions were answered to the patient's satisfaction.     Eloise Harman

## 2021-10-05 NOTE — Transfer of Care (Signed)
Immediate Anesthesia Transfer of Care Note  Patient: [name]  Procedure(s) Performed: COLONOSCOPY WITH PROPOFOL ESOPHAGOGASTRODUODENOSCOPY (EGD) WITH PROPOFOL POLYPECTOMY BIOPSY  Patient Location: Short Stay  Anesthesia Type:General  Level of Consciousness: sedated  Airway & Oxygen Therapy: Patient Spontanous Breathing  Post-op Assessment: Report given to RN and Post -op Vital signs reviewed and stable  Post vital signs: Reviewed and stable  Last Vitals:  Vitals Value Taken Time  BP 107/66 10/05/21 1119  Temp 36.3 C 10/05/21 1119  Pulse 68 10/05/21 1119  Resp 18 10/05/21 1119  SpO2 98 % 10/05/21 1119    Last Pain:  Vitals:   10/05/21 1119  TempSrc: Oral  PainSc: 0-No pain      Patients Stated Pain Goal: 6 (15/99/68 9570)  Complications: No notable events documented.

## 2021-10-05 NOTE — Anesthesia Preprocedure Evaluation (Signed)
Anesthesia Evaluation  Patient identified by MRN, date of birth, ID band Patient awake    Reviewed: Allergy & Precautions, NPO status , Patient's Chart, lab work & pertinent test results  History of Anesthesia Complications (+) PONV and history of anesthetic complications  Airway Mallampati: II  TM Distance: >3 FB Neck ROM: Full    Dental  (+) Edentulous Upper, Partial Lower, Dental Advisory Given   Pulmonary asthma , sleep apnea ,    breath sounds clear to auscultation       Cardiovascular hypertension, negative cardio ROS   Rhythm:Regular Rate:Normal     Neuro/Psych negative neurological ROS     GI/Hepatic Neg liver ROS, hiatal hernia, GERD  ,  Endo/Other  negative endocrine ROS  Renal/GU negative Renal ROS     Musculoskeletal  (+) Arthritis , Fibromyalgia -  Abdominal   Peds  Hematology negative hematology ROS (+)   Anesthesia Other Findings   Reproductive/Obstetrics                             Anesthesia Physical  Anesthesia Plan  ASA: 3  Anesthesia Plan: General   Post-op Pain Management:    Induction: Intravenous  PONV Risk Score and Plan: 4 or greater and TIVA and Propofol infusion  Airway Management Planned: Natural Airway and Nasal Cannula  Additional Equipment:   Intra-op Plan:   Post-operative Plan:   Informed Consent: I have reviewed the patients History and Physical, chart, labs and discussed the procedure including the risks, benefits and alternatives for the proposed anesthesia with the patient or authorized representative who has indicated his/her understanding and acceptance.       Plan Discussed with: CRNA  Anesthesia Plan Comments:         Anesthesia Quick Evaluation

## 2021-10-06 LAB — SURGICAL PATHOLOGY

## 2021-10-11 ENCOUNTER — Encounter (HOSPITAL_COMMUNITY): Payer: Self-pay | Admitting: Internal Medicine

## 2021-10-23 DIAGNOSIS — M797 Fibromyalgia: Secondary | ICD-10-CM | POA: Diagnosis not present

## 2021-10-23 DIAGNOSIS — J329 Chronic sinusitis, unspecified: Secondary | ICD-10-CM | POA: Diagnosis not present

## 2021-11-23 DIAGNOSIS — G5791 Unspecified mononeuropathy of right lower limb: Secondary | ICD-10-CM | POA: Diagnosis not present

## 2021-11-23 DIAGNOSIS — M79676 Pain in unspecified toe(s): Secondary | ICD-10-CM | POA: Diagnosis not present

## 2021-11-23 DIAGNOSIS — G5792 Unspecified mononeuropathy of left lower limb: Secondary | ICD-10-CM | POA: Diagnosis not present

## 2021-12-05 DIAGNOSIS — M5136 Other intervertebral disc degeneration, lumbar region: Secondary | ICD-10-CM | POA: Diagnosis not present

## 2021-12-05 DIAGNOSIS — G629 Polyneuropathy, unspecified: Secondary | ICD-10-CM | POA: Diagnosis not present

## 2021-12-07 DIAGNOSIS — E7849 Other hyperlipidemia: Secondary | ICD-10-CM | POA: Diagnosis not present

## 2021-12-07 DIAGNOSIS — R739 Hyperglycemia, unspecified: Secondary | ICD-10-CM | POA: Diagnosis not present

## 2021-12-07 DIAGNOSIS — E876 Hypokalemia: Secondary | ICD-10-CM | POA: Diagnosis not present

## 2021-12-07 DIAGNOSIS — R5381 Other malaise: Secondary | ICD-10-CM | POA: Diagnosis not present

## 2021-12-07 DIAGNOSIS — D72829 Elevated white blood cell count, unspecified: Secondary | ICD-10-CM | POA: Diagnosis not present

## 2021-12-14 ENCOUNTER — Ambulatory Visit: Payer: Medicare Other | Admitting: Specialist

## 2021-12-15 ENCOUNTER — Ambulatory Visit: Payer: Medicare Other | Admitting: Specialist

## 2021-12-15 ENCOUNTER — Encounter: Payer: Self-pay | Admitting: Specialist

## 2021-12-15 ENCOUNTER — Ambulatory Visit: Payer: Self-pay

## 2021-12-15 VITALS — BP 122/79 | HR 73 | Ht 67.0 in | Wt 167.0 lb

## 2021-12-15 DIAGNOSIS — Z981 Arthrodesis status: Secondary | ICD-10-CM

## 2021-12-15 DIAGNOSIS — E876 Hypokalemia: Secondary | ICD-10-CM | POA: Diagnosis not present

## 2021-12-15 DIAGNOSIS — I10 Essential (primary) hypertension: Secondary | ICD-10-CM | POA: Diagnosis not present

## 2021-12-15 DIAGNOSIS — G43909 Migraine, unspecified, not intractable, without status migrainosus: Secondary | ICD-10-CM | POA: Diagnosis not present

## 2021-12-15 DIAGNOSIS — M4015 Other secondary kyphosis, thoracolumbar region: Secondary | ICD-10-CM

## 2021-12-15 DIAGNOSIS — J301 Allergic rhinitis due to pollen: Secondary | ICD-10-CM | POA: Diagnosis not present

## 2021-12-15 DIAGNOSIS — M5135 Other intervertebral disc degeneration, thoracolumbar region: Secondary | ICD-10-CM | POA: Diagnosis not present

## 2021-12-15 DIAGNOSIS — M48062 Spinal stenosis, lumbar region with neurogenic claudication: Secondary | ICD-10-CM | POA: Diagnosis not present

## 2021-12-15 DIAGNOSIS — M797 Fibromyalgia: Secondary | ICD-10-CM | POA: Diagnosis not present

## 2021-12-15 DIAGNOSIS — M4325 Fusion of spine, thoracolumbar region: Secondary | ICD-10-CM | POA: Diagnosis not present

## 2021-12-15 DIAGNOSIS — M47814 Spondylosis without myelopathy or radiculopathy, thoracic region: Secondary | ICD-10-CM | POA: Diagnosis not present

## 2021-12-15 DIAGNOSIS — Z23 Encounter for immunization: Secondary | ICD-10-CM | POA: Diagnosis not present

## 2021-12-15 DIAGNOSIS — M546 Pain in thoracic spine: Secondary | ICD-10-CM | POA: Diagnosis not present

## 2021-12-15 DIAGNOSIS — K2100 Gastro-esophageal reflux disease with esophagitis, without bleeding: Secondary | ICD-10-CM | POA: Diagnosis not present

## 2021-12-15 DIAGNOSIS — R4582 Worries: Secondary | ICD-10-CM | POA: Diagnosis not present

## 2021-12-15 DIAGNOSIS — E7849 Other hyperlipidemia: Secondary | ICD-10-CM | POA: Diagnosis not present

## 2021-12-15 NOTE — Patient Instructions (Signed)
Plan: Avoid frequent bending and stooping  No lifting greater than 10 lbs. May use ice or moist heat for pain. Weight loss is of benefit. Tylenol for pain Exercise is important to improve your indurance and does allow people to function better inspite of back pain.  Voltaren gel Scapula retraction and cervicothoracic extension exercises to decrease tendency to rounding of the shoulders and forward tilting of the upper spine.

## 2021-12-15 NOTE — Progress Notes (Signed)
Office Visit Note   Patient: [name]           Date of Birth: June 09, 1955           MRN: [id number] Visit Date: 12/15/2021              Requested by: Caryl Bis, MD [place],  [place] [id number] PCP: Caryl Bis, MD   Assessment & Plan: Visit Diagnoses:  1. Pain in thoracic spine   2. Thoracic spondylosis   3. Fusion of spine of thoracolumbar region     Plan: Avoid frequent bending and stooping  No lifting greater than 10 lbs. May use ice or moist heat for pain. Weight loss is of benefit. Tylenol for pain Exercise is important to improve your indurance and does allow people to function better inspite of back pain.  Voltaren gel Scapula retraction and cervicothoracic extension exercises to decrease tendency to rounding of the shoulders and forward tilting of the upper spine.  Follow-Up Instructions: No follow-ups on file.   Orders:  Orders Placed This Encounter  Procedures   XR Thoracic Spine 2 View   No orders of the defined types were placed in this encounter.     Procedures: No procedures performed   Clinical Data: No additional findings.   Subjective: Chief Complaint  Patient presents with   Spine - Follow-up    66 year old female post extension of fusion to the T9 level 03/08/2020. Now with pain transverse upper dorsal spine. No bowel or bladder difficulty. Able to ambulate but not able to dance as she would like to.     Review of Systems  Constitutional: Negative.   HENT: Negative.    Eyes: Negative.   Respiratory: Negative.    Cardiovascular: Negative.   Gastrointestinal: Negative.   Endocrine: Negative.   Genitourinary: Negative.   Musculoskeletal: Negative.   Skin: Negative.   Allergic/Immunologic: Negative.   Neurological: Negative.   Hematological: Negative.   Psychiatric/Behavioral: Negative.       Objective: Vital Signs: BP 122/79 (BP Location: Left Arm, Patient Position: Sitting)   Pulse 73   Ht '5\' 7"'$   (1.702 m)   Wt 167 lb (75.8 kg)   BMI 26.16 kg/m   Physical Exam Constitutional:      Appearance: She is well-developed.  HENT:     Head: Normocephalic and atraumatic.  Eyes:     Pupils: Pupils are equal, round, and reactive to light.  Pulmonary:     Effort: Pulmonary effort is normal.     Breath sounds: Normal breath sounds.  Abdominal:     General: Bowel sounds are normal.     Palpations: Abdomen is soft.  Musculoskeletal:     Cervical back: Normal range of motion and neck supple.     Lumbar back: Negative right straight leg raise test and negative left straight leg raise test.  Skin:    General: Skin is warm and dry.  Neurological:     Mental Status: She is alert and oriented to person, place, and time.  Psychiatric:        Behavior: Behavior normal.        Thought Content: Thought content normal.        Judgment: Judgment normal.     Back Exam   Range of Motion  Extension:  abnormal  Flexion:  abnormal  Lateral bend right:  abnormal  Rotation right:  abnormal  Rotation left:  abnormal   Muscle Strength  Right Quadriceps:  5/5  Left Quadriceps:  5/5  Right Hamstrings:  5/5  Left Hamstrings:  5/5   Tests  Straight leg raise right: negative Straight leg raise left: negative      Specialty Comments:  No specialty comments available.  Imaging: No results found.   PMFS History: Patient Active Problem List   Diagnosis Date Noted   Spondylolysis, lumbar region 03/09/2020    Priority: High    Class: Chronic   Incidental durotomy 03/09/2020    Priority: High    Class: Acute   Pseudarthrosis after fusion or arthrodesis 08/09/2014    Priority: High    Class: Chronic   Painful orthopaedic hardware ([place]) 08/09/2014    Priority: High    Class: Chronic   Spinal stenosis, lumbar region, with neurogenic claudication 04/07/2013    Priority: High    Class: Chronic   Degenerative spondylolisthesis 04/07/2013    Priority: High    Class: Chronic    Ileus, postoperative ([place]) 08/13/2014    Priority: Medium     Class: Acute   Other kyphosis, thoracolumbar region    DDD (degenerative disc disease), thoracic    Postural kyphosis of thoracolumbar region    Fusion of spine of thoracolumbar region 03/08/2020   Seasonal and perennial allergic rhinitis 02/18/2019   Vulvar cysts 04/26/2014   Spinal stenosis of lumbar region with neurogenic claudication 04/07/2013   Past Medical History:  Diagnosis Date   Arthritis    Asthma    Cancer ([place])    cervical , skin   Cold    recent  rx   Fibromyalgia    GERD (gastroesophageal reflux disease)    H/O hiatal hernia    Hypertension    PONV (postoperative nausea and vomiting)    Shortness of breath    Sleep apnea    ' mild " does not use cpap   Tarsal tunnel syndrome of both lower extremities     Family History  Problem Relation Age of Onset   Allergic rhinitis Neg Hx    Angioedema Neg Hx    Asthma Neg Hx    Atopy Neg Hx    Eczema Neg Hx    Immunodeficiency Neg Hx    Urticaria Neg Hx     Past Surgical History:  Procedure Laterality Date   ABDOMINAL HYSTERECTOMY  2004   APPENDECTOMY     BACK SURGERY     x3   BACK SURGERY  02/2020   rod placed in back   BIOPSY  10/05/2021   Procedure: BIOPSY;  Surgeon: Eloise Harman, DO;  Location: [facility];  Service: Endoscopy;;   CERVICAL CONIZATION W/BX     CERVICAL [place]  2006   CHOLECYSTECTOMY  2003   COLONOSCOPY WITH PROPOFOL N/A 10/05/2021   Procedure: COLONOSCOPY WITH PROPOFOL;  Surgeon: Eloise Harman, DO;  Location: [facility];  Service: Endoscopy;  Laterality: N/A;  12:15pm, asa 3   ESOPHAGOGASTRODUODENOSCOPY (EGD) WITH PROPOFOL N/A 10/05/2021   Procedure: ESOPHAGOGASTRODUODENOSCOPY (EGD) WITH PROPOFOL;  Surgeon: Eloise Harman, DO;  Location: [facility];  Service: Endoscopy;  Laterality: N/A;   LUMBAR FUSION  04/07/2013   L 2 L3 L4    with rod     DR Calyn Sivils   POLYPECTOMY  10/05/2021   Procedure: POLYPECTOMY;   Surgeon: Eloise Harman, DO;  Location: [facility];  Service: Endoscopy;;   TONSILLECTOMY     Social History   Occupational History   Not on  file  Tobacco Use   Smoking status: Never   Smokeless tobacco: Never  Vaping Use   Vaping Use: Never used  Substance and Sexual Activity   Alcohol use: No   Drug use: No   Sexual activity: Yes    Birth control/protection: Surgical

## 2021-12-16 NOTE — Progress Notes (Signed)
66 year old female with history of lumbar stenosis and an implanted spinal cord stimulator comes in for prep evaluation.  Pain unchanged from previous visit.  She is wanting to proceed with REMOVAL OF SPINAL CORD STIMULATOR AND BATTERY, REMOVAL OF HARDWARE L1 TO L3, TRANSFORAMINAL LUMBAR INTERBODY FUSION LEFT L1-2 AND T12-L1, POSTERIOR FUSION INSTRUMENTATION T10, T11, T12, L1 AND L2, LOCAL BONE GRAFT, ALLOGRAFT BONE GRAFT, VIVIGEN as scheduled.  Surgical procedure discussed.  All questions answered.

## 2022-01-02 NOTE — Progress Notes (Deleted)
Primary Care Physician:  Caryl Bis, MD  Primary GI: Dr. Abbey Chatters  Patient Location: Home   Provider Location: [facility] office   Reason for Visit: Follow-up   Persons present on the virtual encounter, with roles: Aliene Altes, PA-C (Provider), [name] (patient)   Total time (minutes) spent on medical discussion: ### minutes  Virtual Visit via *** Note Due to COVID-19, visit is conducted virtually and was requested by patient.   I connected with Wynn Banker on 01/02/22 at 10:00 AM EDT by *** and verified that I am speaking with the correct person using two identifiers.   I discussed the limitations, risks, security and privacy concerns of performing an evaluation and management service by *** and the availability of in person appointments. I also discussed with the patient that there may be a patient responsible charge related to this service. The patient expressed understanding and agreed to proceed.  No chief complaint on file.    History of Present Illness: 66 y.o. female with history of GERD, chronic abdominal pain, colon polyps and family history of colon cancer, presenting today for follow-up.   Last seen in our office 09/06/2021.  Reported chronic GERD fairly well controlled on pantoprazole and famotidine twice daily.  Intermittent breakthrough symptoms.  Reported chronic issues with abdominal pain and GERD.  Reported approximately 2 months prior while sitting in a recliner, she had sudden onset chest and epigastric pain radiating to left and right into her back.  Also noted right arm numbness and nausea along with fatigue, weakness the next day.  She was seen by cardiology who felt symptoms were not cardiac related and recommended GI evaluation.  Recommended EGD for further evaluation as well as colonoscopy for surveillance purposes, continue pantoprazole and famotidine.  Procedures 10/05/21: EGD: Gastritis biopsied, multiple gastric polyps, otherwise normal exam.  Gastric biopsies benign, fundic gland polyps. Colonoscopy: Non-bleeding internal hemorrhoids, two 3 to 5 mm polyps in the ascending colon, one 5 mm polyp in the descending colon, removed with a cold snare. Resected and retrieved. Pathology with tubular adenoma and hyperplastic polyp. Recommended 5 year surveillance.   Today:   GERD:   Abdominal pain:    Past Medical History:  Diagnosis Date   Arthritis    Asthma    Cancer ([place])    cervical , skin   Cold    recent  rx   Fibromyalgia    GERD (gastroesophageal reflux disease)    H/O hiatal hernia    Hypertension    PONV (postoperative nausea and vomiting)    Shortness of breath    Sleep apnea    ' mild " does not use cpap   Tarsal tunnel syndrome of both lower extremities      Past Surgical History:  Procedure Laterality Date   ABDOMINAL HYSTERECTOMY  2004   APPENDECTOMY     BACK SURGERY     x3   BACK SURGERY  02/2020   rod placed in back   BIOPSY  10/05/2021   Procedure: BIOPSY;  Surgeon: Eloise Harman, DO;  Location: [facility];  Service: Endoscopy;;   CERVICAL CONIZATION W/BX     [place]  2006   CHOLECYSTECTOMY  2003   COLONOSCOPY WITH PROPOFOL N/A 10/05/2021   Procedure: COLONOSCOPY WITH PROPOFOL;  Surgeon: Eloise Harman, DO;  Location: [facility];  Service: Endoscopy;  Laterality: N/A;  12:15pm, asa 3   ESOPHAGOGASTRODUODENOSCOPY (EGD) WITH PROPOFOL N/A 10/05/2021   Procedure: ESOPHAGOGASTRODUODENOSCOPY (  EGD) WITH PROPOFOL;  Surgeon: Eloise Harman, DO;  Location: [facility];  Service: Endoscopy;  Laterality: N/A;   LUMBAR FUSION  04/07/2013   L 2 L3 L4    with rod     DR NITKA   POLYPECTOMY  10/05/2021   Procedure: POLYPECTOMY;  Surgeon: Eloise Harman, DO;  Location: [facility];  Service: Endoscopy;;   TONSILLECTOMY       No outpatient medications have been marked as taking for the 01/05/22 encounter (Appointment) with Erenest Rasher, PA-C.     Family History   Problem Relation Age of Onset   Allergic rhinitis Neg Hx    Angioedema Neg Hx    Asthma Neg Hx    Atopy Neg Hx    Eczema Neg Hx    Immunodeficiency Neg Hx    Urticaria Neg Hx     Social History   Socioeconomic History   Marital status: Married    Spouse name: Not on file   Number of children: 1   Years of education: Not on file   Highest education level: 11th grade  Occupational History   Not on file  Tobacco Use   Smoking status: Never   Smokeless tobacco: Never  Vaping Use   Vaping Use: Never used  Substance and Sexual Activity   Alcohol use: No   Drug use: No   Sexual activity: Yes    Birth control/protection: Surgical  Other Topics Concern   Not on file  Social History Narrative   Not on file   Social Determinants of Health   Financial Resource Strain: Not on file  Food Insecurity: Not on file  Transportation Needs: Not on file  Physical Activity: Not on file  Stress: Not on file  Social Connections: Not on file       Review of Systems: Gen: Denies fever, chills, cold or flu like symptoms, pre-syncope, or syncope.   CV: Denies chest pain, palpitations. Resp: Denies dyspnea, cough.  GI: see HPI Derm: Denies rash. Psych: Denies depression, anxiety.  Heme: See HPI  Observations/Objective: No distress. Alert and oriented. Pleasant. Well nourished. Normal mood and affect. Unable to perform complete physical exam due to *** encounter. No video available. ***   Assessment:     Plan: ***     I discussed the assessment and treatment plan with the patient. The patient was provided an opportunity to ask questions and all were answered. The patient agreed with the plan and demonstrated an understanding of the instructions.   The patient was advised to call back or seek an in-person evaluation if the symptoms worsen or if the condition fails to improve as anticipated.  I provided *** minutes of non***-face-to-face time during this  encounter.  Aliene Altes, PA-C [facility]  01/05/2022

## 2022-01-05 ENCOUNTER — Telehealth: Payer: Medicare Other | Admitting: Gastroenterology

## 2022-01-05 ENCOUNTER — Ambulatory Visit: Payer: Medicare Other | Admitting: Gastroenterology

## 2022-01-09 DIAGNOSIS — R030 Elevated blood-pressure reading, without diagnosis of hypertension: Secondary | ICD-10-CM | POA: Diagnosis not present

## 2022-01-09 DIAGNOSIS — M19011 Primary osteoarthritis, right shoulder: Secondary | ICD-10-CM | POA: Diagnosis not present

## 2022-01-09 DIAGNOSIS — M25511 Pain in right shoulder: Secondary | ICD-10-CM | POA: Diagnosis not present

## 2022-01-09 DIAGNOSIS — J329 Chronic sinusitis, unspecified: Secondary | ICD-10-CM | POA: Diagnosis not present

## 2022-01-15 DIAGNOSIS — Z23 Encounter for immunization: Secondary | ICD-10-CM | POA: Diagnosis not present

## 2022-02-01 DIAGNOSIS — M797 Fibromyalgia: Secondary | ICD-10-CM | POA: Diagnosis not present

## 2022-02-01 DIAGNOSIS — R300 Dysuria: Secondary | ICD-10-CM | POA: Diagnosis not present

## 2022-02-08 DIAGNOSIS — R300 Dysuria: Secondary | ICD-10-CM | POA: Diagnosis not present

## 2022-02-26 DIAGNOSIS — J329 Chronic sinusitis, unspecified: Secondary | ICD-10-CM | POA: Diagnosis not present

## 2022-02-28 DIAGNOSIS — M797 Fibromyalgia: Secondary | ICD-10-CM | POA: Diagnosis not present

## 2022-02-28 DIAGNOSIS — E7849 Other hyperlipidemia: Secondary | ICD-10-CM | POA: Diagnosis not present

## 2022-02-28 DIAGNOSIS — R5381 Other malaise: Secondary | ICD-10-CM | POA: Diagnosis not present

## 2022-02-28 DIAGNOSIS — I10 Essential (primary) hypertension: Secondary | ICD-10-CM | POA: Diagnosis not present

## 2022-02-28 DIAGNOSIS — N183 Chronic kidney disease, stage 3 unspecified: Secondary | ICD-10-CM | POA: Diagnosis not present

## 2022-02-28 DIAGNOSIS — K2100 Gastro-esophageal reflux disease with esophagitis, without bleeding: Secondary | ICD-10-CM | POA: Diagnosis not present

## 2022-02-28 DIAGNOSIS — E876 Hypokalemia: Secondary | ICD-10-CM | POA: Diagnosis not present

## 2022-02-28 DIAGNOSIS — Z0001 Encounter for general adult medical examination with abnormal findings: Secondary | ICD-10-CM | POA: Diagnosis not present

## 2022-02-28 DIAGNOSIS — Z1329 Encounter for screening for other suspected endocrine disorder: Secondary | ICD-10-CM | POA: Diagnosis not present

## 2022-03-01 DIAGNOSIS — Z1329 Encounter for screening for other suspected endocrine disorder: Secondary | ICD-10-CM | POA: Diagnosis not present

## 2022-03-01 DIAGNOSIS — E876 Hypokalemia: Secondary | ICD-10-CM | POA: Diagnosis not present

## 2022-03-01 DIAGNOSIS — K2100 Gastro-esophageal reflux disease with esophagitis, without bleeding: Secondary | ICD-10-CM | POA: Diagnosis not present

## 2022-03-01 DIAGNOSIS — Z0001 Encounter for general adult medical examination with abnormal findings: Secondary | ICD-10-CM | POA: Diagnosis not present

## 2022-03-01 DIAGNOSIS — M797 Fibromyalgia: Secondary | ICD-10-CM | POA: Diagnosis not present

## 2022-03-01 DIAGNOSIS — Z13220 Encounter for screening for lipoid disorders: Secondary | ICD-10-CM | POA: Diagnosis not present

## 2022-03-06 DIAGNOSIS — G43909 Migraine, unspecified, not intractable, without status migrainosus: Secondary | ICD-10-CM | POA: Diagnosis not present

## 2022-03-06 DIAGNOSIS — E876 Hypokalemia: Secondary | ICD-10-CM | POA: Diagnosis not present

## 2022-03-06 DIAGNOSIS — M7062 Trochanteric bursitis, left hip: Secondary | ICD-10-CM | POA: Diagnosis not present

## 2022-03-06 DIAGNOSIS — R4582 Worries: Secondary | ICD-10-CM | POA: Diagnosis not present

## 2022-03-06 DIAGNOSIS — E7849 Other hyperlipidemia: Secondary | ICD-10-CM | POA: Diagnosis not present

## 2022-03-06 DIAGNOSIS — J301 Allergic rhinitis due to pollen: Secondary | ICD-10-CM | POA: Diagnosis not present

## 2022-03-06 DIAGNOSIS — K2100 Gastro-esophageal reflux disease with esophagitis, without bleeding: Secondary | ICD-10-CM | POA: Diagnosis not present

## 2022-03-06 DIAGNOSIS — Z0001 Encounter for general adult medical examination with abnormal findings: Secondary | ICD-10-CM | POA: Diagnosis not present

## 2022-03-06 DIAGNOSIS — I10 Essential (primary) hypertension: Secondary | ICD-10-CM | POA: Diagnosis not present

## 2022-03-06 DIAGNOSIS — M797 Fibromyalgia: Secondary | ICD-10-CM | POA: Diagnosis not present

## 2022-03-16 ENCOUNTER — Encounter: Payer: Self-pay | Admitting: Orthopedic Surgery

## 2022-03-16 ENCOUNTER — Ambulatory Visit: Payer: Medicare Other | Admitting: Orthopedic Surgery

## 2022-03-16 ENCOUNTER — Ambulatory Visit (INDEPENDENT_AMBULATORY_CARE_PROVIDER_SITE_OTHER): Payer: Medicare Other

## 2022-03-16 VITALS — BP 138/84 | HR 76 | Ht 67.0 in | Wt 167.0 lb

## 2022-03-16 DIAGNOSIS — M4325 Fusion of spine, thoracolumbar region: Secondary | ICD-10-CM | POA: Diagnosis not present

## 2022-03-16 MED ORDER — METHYLPREDNISOLONE 4 MG PO TBPK: ORAL_TABLET | ORAL | 0 refills | Status: DC

## 2022-03-16 NOTE — Progress Notes (Addendum)
Orthopedic Spine Surgery Office Note  Assessment: Patient is a 66 y.o. female who has undergone several prior lumbar surgeries. 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  She has no neurologic changes and is not interested in surgery as her pain is tolerable.  We will continue to monitor for now -Patient has multiple allergies so she is not interested in medications at this point.  She is going to keep going with core strengthening and activity modification -Prescribed a Medrol Dosepak for her several other pains as this has helped her in the past -Patient should return to office in 12 weeks, x-rays at next visit: thoracolumbar AP and lateral   Patient expressed understanding of the plan and all questions were answered to the patient's satisfaction.   ___________________________________________________________________________   History:  Patient is a 66 y.o. female who presents today for lumbar spine. This is a former patient of my retired partner, Dr. Louanne Skye, who has undergone several prior lumbar spine surgeries.  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 Louanne Skye on 03/08/2020.  Patient did well after the surgery, but then within the last year and a half is developed pain near the top of her incision.  She states that if she is careful about her activities it does not hurt.  However, if she is more active but does hurt her significantly.  She has multiple pains over her hips and in her knees and in her shoulders.  She has a history of fibromyalgia and attributes most this to fibromyalgia.  She has tried oral steroids in the past with a Medrol Dosepak and said that does give her relief.  She was interested in trying that for her other  pains.   Weakness: Denies Symptoms of imbalance: Yes, has had chronic imbalance for several years.  No recent changes Paresthesias and numbness: Denies Bowel or bladder incontinence: Denies Saddle anesthesia: Denies Clumsiness with hands: denies  Treatments tried: Tylenol, activity modification  Review of systems: Denies fevers and chills, night sweats, unexplained weight loss. Has history of cervical cancer and has had pain that wakes her at night  Past medical history: Osteoarthritis GERD Fibromyalgia Cervical cancer Hypertension Sleep apnea  Allergies (14): Cephalosporins, codeine, doxycycline, Novocain, statins, sulfa, TCAs, gabapentin, ACE inhibitors, aspirin, morphine, Ultram, Zetia, pain medications  Past surgical history:  Hysterectomy Appendectomy Multiple lumbar surgeries, most recent was extension of fusion to T10 Cholecystectomy Polypectomy Tonsillectomy C5/6 and C6/7 ACDF with [facility]  Social history: Denies use of nicotine product (smoking, vaping, patches, smokeless) Alcohol use: denies Denies recreational drug use   Physical Exam:  General: no acute distress, appears stated age Neurologic: alert, answering questions appropriately, following commands Respiratory: unlabored breathing on room air, symmetric chest rise Psychiatric: appropriate affect, normal cadence to speech   MSK (spine):  -Strength exam      Left  Right EHL    4/5  4/5 TA    5/5  5/5 GSC    5/5  5/5 Knee extension  5/5  5/5 Hip flexion   5/5  4+/5  -Sensory exam    Sensation intact to light touch in L3-S1 nerve distributions of bilateral lower extremities  -Achilles DTR: 1/4 on the left, 1/4 on the right -Patellar tendon DTR: 1/4 on the  left, 1/4 on the right  -Straight leg raise: negative -Contralateral straight leg raise: negative -Clonus: no beats bilaterally -Negative Romberg -Slow, short stepped gait -Imbalance with tandem gait -Negative hoffman  bilaterally  -LLE: no pain through range of motion, TTP over the lateral hip, TTP over the knee and ankle, negative FABER -RLE: positive FADIR, no pain through remainder of range of motion, TTP over the lateral hip, TTP over the knee and ankle, negative FABER  Imaging: XR of the thoracic and lumbar spine from 03/16/2022 and 12/15/2021 was independently reviewed and interpreted, showing Glattes angle of 2.7 degrees on her pre-operative scoliosis films from 12/23/2019. That angle on films from 12/15/2021 is 14.7 degrees. She has disc height loss anteriorly at T9/10. Grade 1 anterolisthesis at T9 that was not seen on pre-op films. No fracture seen. No lucency seen around the cranial screws and screws are not backing out.    Patient name: [name] Patient MRN: [id number] Date of visit: 03/16/22

## 2022-04-02 DIAGNOSIS — J0101 Acute recurrent maxillary sinusitis: Secondary | ICD-10-CM | POA: Diagnosis not present

## 2022-04-27 DIAGNOSIS — R030 Elevated blood-pressure reading, without diagnosis of hypertension: Secondary | ICD-10-CM | POA: Diagnosis not present

## 2022-04-27 DIAGNOSIS — J019 Acute sinusitis, unspecified: Secondary | ICD-10-CM | POA: Diagnosis not present

## 2022-04-27 DIAGNOSIS — M797 Fibromyalgia: Secondary | ICD-10-CM | POA: Diagnosis not present

## 2022-05-01 DIAGNOSIS — K136 Irritative hyperplasia of oral mucosa: Secondary | ICD-10-CM | POA: Diagnosis not present

## 2022-06-07 ENCOUNTER — Other Ambulatory Visit (INDEPENDENT_AMBULATORY_CARE_PROVIDER_SITE_OTHER): Payer: Medicare Other

## 2022-06-07 ENCOUNTER — Ambulatory Visit: Payer: Medicare Other | Admitting: Orthopedic Surgery

## 2022-06-07 DIAGNOSIS — M5412 Radiculopathy, cervical region: Secondary | ICD-10-CM

## 2022-06-07 DIAGNOSIS — M4325 Fusion of spine, thoracolumbar region: Secondary | ICD-10-CM | POA: Diagnosis not present

## 2022-06-07 MED ORDER — METHYLPREDNISOLONE 4 MG PO TBPK: ORAL_TABLET | ORAL | 0 refills | Status: DC

## 2022-06-07 NOTE — Addendum Note (Signed)
Addended by: Ileene Rubens on: 06/07/2022 04:58 PM   Modules accepted: Orders

## 2022-06-07 NOTE — Progress Notes (Addendum)
Orthopedic Spine Surgery Office Note  Assessment: Patient is a 67 y.o. female with T10-S1 fusion with most recent surgery on 03/08/2020 with Dr. Louanne Skye.  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  Now, has developed pain and numbness radiating down her right upper extremity.  Possible radiculopathy   Plan: -Patient has tried Tylenol, activity modification -Recommended continued monitoring of her thoracic spine.  In regards to her cervical, recommended a Medrol Dosepak. I also instructed her to take Tylenol 3 times per day.  If she is not better in 6 weeks time, will recommend an MRI of the cervical spine to evaluate for radiculopathy.  Initially, I had recommended a cervical spine injection but after discussing with Jinny Blossom, will hold off as it would make more sense to get an MRI before proceeding with injection -Patient should return to office in 8 weeks, x-rays at next visit: Thoracic AP/lateral   Patient expressed understanding of the plan and all questions were answered to the patient's satisfaction.   __________________________________________________________________________  History: Patient is a 66 y.o. female who has been previously seen in the office for symptoms thoracic back pain.  She has undergone several lumbar spine surgeries with Dr. Louanne Skye.  Her most recent was an extension of her fusion to T10 for proximal junctional kyphosis on 03/08/2020.  She had done well after that surgery but within the last year and a half has developed pain at the top of her incision.  Her pain has been stable since last time I saw her.  She does not have any pain radiating to the lower extremities.  She has developed a new symptom which is pain starting in her neck radiating down her right upper extremity.  Pain is felt over the lateral aspect of the shoulder into the arm and into the forearm.  She said it goes all the way into the fingertips.  She says that all fingers  are affected on the right side.  She has no similar symptoms on the left side.  She gets numbness throughout the arm as well.  The numbness resolves though.  She notices the numbness when she is dancing with her husband.  This new symptom has been noted for the past month.  There is no trauma or injury that brought on the pain.  No saddle anesthesia.  No bowel or bladder incontinence.  No new clumsiness with the hands or imbalance issues.  Previous treatments: Tylenol, activity modification  Physical Exam:  General: no acute distress, appears stated age Neurologic: alert, answering questions appropriately, following commands Respiratory: unlabored breathing on room air, symmetric chest rise Psychiatric: appropriate affect, normal cadence to speech   MSK (spine):  -Strength exam      Left  Right Grip strength               5/5  5/5 Interosseus   5/5   5/5 Wrist extension  5/5  5/5 Wrist flexion   5/5  5/5 Elbow flexion   5/5  5/5 Deltoid    5/5  5/5  EHL    5/5  5/5 TA    5/5  5/5 GSC    5/5  5/5 Knee extension  5/5  5/5 Hip flexion   5/5  5/5  -Sensory exam    Sensation intact to light touch in L3-S1 nerve distributions of bilateral lower extremities  Sensation intact to light touch in C5-T1 nerve distributions of bilateral upper extremities  -Brachioradialis  DTR: 2/4 on the left, 2/4 on the right -Biceps DTR: 2/4 on the left, 2/4 on the right -Achilles DTR: 1/4 on the left, 1/4 on the right -Patellar tendon DTR: 1/4 on the left, 1/4 on the right  -Spurling: Negative bilaterally -Hoffman sign: Negative bilaterally -Clonus: No beats bilaterally -Interosseous wasting: None seen -Grip and release test: Negative -Romberg: Negative -Gait: Slow, short stepped but not wide-based -Imbalance with tandem gait: Yes  Left shoulder exam: No pain to range of motion, negative Jobe, no weakness with external rotation with arm at side, negative belly press Right shoulder exam: No  pain to range of motion, negative Jobe, no weakness with external rotation with arm at side, negative belly press  Tinel's at wrist: Negative bilaterally Phalen's at wrist: Negative bilaterally Durkan's: Negative bilaterally  Tinel's at elbow: Negative bilaterally  Imaging: XR of the cervical spine from 06/07/2022 was independently reviewed and interpreted, showing anterior cervical fusion from C5-C7.  There is no instrumentation seen.  There appears to be solid fusion across those former disc spaces.  Disc height loss at C3-4 and C4-5.  No fracture or dislocation seen.  No evidence of instability on flexion/extension views.  XR of the thoracic spine from 06/07/2022 was independently reviewed and interpreted, showing posterior instrumentation in similar position as prior films.  No lucency seen around the instrumentation.  Alignment appears similar to last films on 03/16/2022.  Disc height loss at T9/10.    Patient name: [name] Patient MRN: [id number] Date of visit: 06/07/22

## 2022-06-11 DIAGNOSIS — H25812 Combined forms of age-related cataract, left eye: Secondary | ICD-10-CM | POA: Diagnosis not present

## 2022-06-11 DIAGNOSIS — H25811 Combined forms of age-related cataract, right eye: Secondary | ICD-10-CM | POA: Diagnosis not present

## 2022-06-15 ENCOUNTER — Ambulatory Visit: Payer: Medicare Other | Admitting: Orthopedic Surgery

## 2022-06-19 DIAGNOSIS — K2100 Gastro-esophageal reflux disease with esophagitis, without bleeding: Secondary | ICD-10-CM | POA: Diagnosis not present

## 2022-06-19 DIAGNOSIS — N183 Chronic kidney disease, stage 3 unspecified: Secondary | ICD-10-CM | POA: Diagnosis not present

## 2022-06-19 DIAGNOSIS — I10 Essential (primary) hypertension: Secondary | ICD-10-CM | POA: Diagnosis not present

## 2022-06-19 DIAGNOSIS — E7849 Other hyperlipidemia: Secondary | ICD-10-CM | POA: Diagnosis not present

## 2022-06-19 DIAGNOSIS — J329 Chronic sinusitis, unspecified: Secondary | ICD-10-CM | POA: Diagnosis not present

## 2022-06-21 DIAGNOSIS — H269 Unspecified cataract: Secondary | ICD-10-CM | POA: Diagnosis not present

## 2022-06-21 DIAGNOSIS — H25811 Combined forms of age-related cataract, right eye: Secondary | ICD-10-CM | POA: Diagnosis not present

## 2022-06-26 DIAGNOSIS — E876 Hypokalemia: Secondary | ICD-10-CM | POA: Diagnosis not present

## 2022-06-26 DIAGNOSIS — Z23 Encounter for immunization: Secondary | ICD-10-CM | POA: Diagnosis not present

## 2022-06-26 DIAGNOSIS — D692 Other nonthrombocytopenic purpura: Secondary | ICD-10-CM | POA: Diagnosis not present

## 2022-06-26 DIAGNOSIS — M797 Fibromyalgia: Secondary | ICD-10-CM | POA: Diagnosis not present

## 2022-06-26 DIAGNOSIS — R7301 Impaired fasting glucose: Secondary | ICD-10-CM | POA: Diagnosis not present

## 2022-06-26 DIAGNOSIS — E7849 Other hyperlipidemia: Secondary | ICD-10-CM | POA: Diagnosis not present

## 2022-06-26 DIAGNOSIS — I10 Essential (primary) hypertension: Secondary | ICD-10-CM | POA: Diagnosis not present

## 2022-06-26 DIAGNOSIS — G43909 Migraine, unspecified, not intractable, without status migrainosus: Secondary | ICD-10-CM | POA: Diagnosis not present

## 2022-06-26 DIAGNOSIS — K2100 Gastro-esophageal reflux disease with esophagitis, without bleeding: Secondary | ICD-10-CM | POA: Diagnosis not present

## 2022-07-04 DIAGNOSIS — R7301 Impaired fasting glucose: Secondary | ICD-10-CM | POA: Diagnosis not present

## 2022-07-10 DIAGNOSIS — Z1231 Encounter for screening mammogram for malignant neoplasm of breast: Secondary | ICD-10-CM | POA: Diagnosis not present

## 2022-07-12 DIAGNOSIS — H269 Unspecified cataract: Secondary | ICD-10-CM | POA: Diagnosis not present

## 2022-07-12 DIAGNOSIS — H25812 Combined forms of age-related cataract, left eye: Secondary | ICD-10-CM | POA: Diagnosis not present

## 2022-07-18 ENCOUNTER — Ambulatory Visit: Payer: Medicare Other | Admitting: Orthopedic Surgery

## 2022-07-18 ENCOUNTER — Other Ambulatory Visit (INDEPENDENT_AMBULATORY_CARE_PROVIDER_SITE_OTHER): Payer: Medicare Other

## 2022-07-18 DIAGNOSIS — M4325 Fusion of spine, thoracolumbar region: Secondary | ICD-10-CM

## 2022-07-18 DIAGNOSIS — M5412 Radiculopathy, cervical region: Secondary | ICD-10-CM | POA: Diagnosis not present

## 2022-07-18 MED ORDER — METHYLPREDNISOLONE 4 MG PO TBPK: ORAL_TABLET | ORAL | 0 refills | Status: DC

## 2022-07-18 NOTE — Progress Notes (Signed)
Orthopedic Spine Surgery Office Note   Assessment: Patient is a 67 y.o. female with T10-S1 fusion with most recent surgery on 03/08/2020 with Dr. Otelia Sergeant.  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 Her pain is tolerable for that.  She continues to have pain radiating down her right upper extremity, suspect radiculopathy     Plan: -Patient has tried Tylenol, activity modification -Recommended continued monitoring of her thoracic spine -She has tried conservative treatment for her cervical radicular symptoms without any relief for over 6 weeks now, so recommended MRI of the cervical spine -Prescribed medrol dosepak since she is in significant pain and last one helped. She has allergies to 14 medications so I am limited in what can be prescribed.  -Patient should return to office in 5 weeks, x-rays at next visit: none     Patient expressed understanding of the plan and all questions were answered to the patient's satisfaction.    __________________________________________________________________________   History: Patient is a 67 y.o. female who has been previously seen in the office for symptoms thoracic back pain.  She has undergone several lumbar spine surgeries with Dr. Otelia Sergeant.  Her most recent was an extension of her fusion to T10 for proximal junctional kyphosis on 03/08/2020.  Still having pain in her upper thoracic spine.  It is similar to the last time I saw her.  She is still having neck pain that radiates into her right upper extremity.  She says it has gotten much more severe since the last time I saw her.  She is not able to do any activities she enjoys because of the pain.  Pain starts in her neck and radiates down the right lateral aspect of the arm into the forearm and hand.  She says she feels it in all the fingers.  She has noticed numbness and paresthesias distal to the wrist on the right side.  No radiating pain or numbness or paresthesias in the left upper extremity.   Previous  treatments: Tylenol, activity modification, medrol dosepak   Physical Exam:   General: no acute distress, appears stated age Neurologic: alert, answering questions appropriately, following commands Respiratory: unlabored breathing on room air, symmetric chest rise Psychiatric: appropriate affect, normal cadence to speech     MSK (spine):   -Strength exam                                                   Left                  Right Grip strength               5/5                   5/5 Interosseus                  5/5                  5/5 Wrist extension            5/5                  5/5 Wrist flexion                 5/5  5/5 Elbow flexion                5/5                  5/5 Deltoid                          5/5                  5/5   EHL                              5/5                  5/5 TA                                 5/5                  5/5 GSC                             5/5                  5/5 Knee extension            5/5                  5/5 Hip flexion                    5/5                  5/5   -Sensory exam                           Sensation intact to light touch in L3-S1 nerve distributions of bilateral lower extremities             Sensation intact to light touch in C5-T1 nerve distributions of bilateral upper extremities (decreased distal to the wrist on the right side)   -Brachioradialis DTR: 2/4 on the left, 2/4 on the right -Biceps DTR: 2/4 on the left, 2/4 on the right -Achilles DTR: 1/4 on the left, 1/4 on the right -Patellar tendon DTR: 1/4 on the left, 1/4 on the right   -Spurling: Negative bilaterally -Hoffman sign: Negative bilaterally -Clonus: No beats bilaterally -Interosseous wasting: None seen -Grip and release test: Negative -Romberg: Negative -Gait: Slow, short stepped but not wide-based -Imbalance with tandem gait: Yes   Left shoulder exam: No pain to range of motion, negative Jobe, no weakness with external  rotation with arm at side, negative belly press Right shoulder exam: No pain to range of motion, negative Jobe, no weakness with external rotation with arm at side, negative belly press   Tinel's at wrist: Negative bilaterally Phalen's at wrist: Negative bilaterally Durkan's: Negative bilaterally   Tinel's at elbow: Negative bilaterally   Imaging: XR of the cervical spine from 06/07/2022 was previously independently reviewed and interpreted, showing anterior cervical fusion from C5-C7.  There is no instrumentation seen.  There appears to be solid fusion across those former disc spaces.  Disc height loss at C3/4 and C4/5.  No fracture or dislocation seen.  No evidence of instability on flexion/extension views.   XR of the thoracic  spine from 07/18/2022 was independently reviewed and interpreted, showing posterior instrumentation in similar position as prior films.  No lucency seen around the instrumentation.  Alignment appears similar to last films on 06/07/2022. No fracture or dislocation seen.     Patient name: [name] Patient MRN: [id number] Date of visit: 07/18/22

## 2022-07-19 DIAGNOSIS — H04123 Dry eye syndrome of bilateral lacrimal glands: Secondary | ICD-10-CM | POA: Diagnosis not present

## 2022-08-03 ENCOUNTER — Ambulatory Visit (HOSPITAL_COMMUNITY)
Admission: RE | Admit: 2022-08-03 | Discharge: 2022-08-03 | Disposition: A | Discharge status: Home / Self Care | Payer: Medicare Other | Source: Ambulatory Visit | Attending: Orthopedic Surgery | Admitting: Orthopedic Surgery

## 2022-08-03 DIAGNOSIS — M5412 Radiculopathy, cervical region: Secondary | ICD-10-CM

## 2022-08-06 DIAGNOSIS — M797 Fibromyalgia: Secondary | ICD-10-CM | POA: Diagnosis not present

## 2022-08-08 ENCOUNTER — Ambulatory Visit: Payer: Medicare Other | Admitting: Orthopedic Surgery

## 2022-08-16 ENCOUNTER — Ambulatory Visit: Payer: Medicare Other | Admitting: Orthopedic Surgery

## 2022-08-16 DIAGNOSIS — M5412 Radiculopathy, cervical region: Secondary | ICD-10-CM | POA: Diagnosis not present

## 2022-08-16 NOTE — Progress Notes (Signed)
Orthopedic Spine Surgery Office Note   Assessment: Patient is a 67 y.o. female with neck pain that radiates into the right upper extremity to the level of the hand.  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     Plan: -Patient has tried Tylenol, activity modification -Recommended diagnostic/therapeutic cervical ESI -I told her that having symptoms radiate all the way to the hand and all the fingers is atypical for foraminal stenosis in the upper cervical spine.  For that reason, recommended EMG/NCS -Patient should return to office in 5 weeks, x-rays at next visit: none     Patient expressed understanding of the plan and all questions were answered to the patient's satisfaction.    __________________________________________________________________________   History: Patient is a 67 y.o. female who has been previously seen in the office for neck pain radiating into the right upper extremity.  She comes in today for routine follow-up and to go over MRI.  She is feeling pain in her neck that radiates into the right upper extremity.  She feels it goes all the way down the arm into the hand.  She feels it in all the fingers.  She is also noticed decrease sensation and paresthesias in the hand distal to the wrist.  Due to the decrease in sensation she has had trouble holding onto objects on that hand.  She has no issues with fine motor skills or dexterity in the left hand.  Has had chronic issues with imbalance but no recent changes.   Previous treatments: Tylenol, activity modification, medrol dosepak   Physical Exam:   General: no acute distress, appears stated age Neurologic: alert, answering questions appropriately, following commands Respiratory: unlabored breathing on room air, symmetric chest rise Psychiatric: appropriate affect, normal cadence to speech     MSK (spine):   -Strength exam                                                   Left                  Right Grip strength                 5/5                   5/5 Interosseus                  5/5                  5/5 Wrist extension            5/5                  5/5 Wrist flexion                 5/5                  5/5 Elbow flexion                5/5                  5/5 Deltoid                          5/5  5/5   EHL                              5/5                  5/5 TA                                 5/5                  5/5 GSC                             5/5                  5/5 Knee extension            5/5                  5/5 Hip flexion                    5/5                  5/5   -Sensory exam                           Sensation intact to light touch in L3-S1 nerve distributions of bilateral lower extremities             Sensation intact to light touch in C5-T1 nerve distributions of bilateral upper extremities (decreased distal to the wrist on the right side)   -Brachioradialis DTR: 2/4 on the left, 2/4 on the right -Biceps DTR: 2/4 on the left, 2/4 on the right -Achilles DTR: 1/4 on the left, 1/4 on the right -Patellar tendon DTR: 1/4 on the left, 1/4 on the right   -Spurling: Negative bilaterally -Hoffman sign: Negative bilaterally -Clonus: No beats bilaterally -Interosseous wasting: None seen -Grip and release test: Negative -Romberg: Negative -Gait: Slow, short stepped but not wide-based -Imbalance with tandem gait: Yes   Left shoulder exam: No pain to range of motion, negative Jobe, no weakness with external rotation with arm at side, negative belly press Right shoulder exam: No pain to range of motion, negative Jobe, no weakness with external rotation with arm at side, negative belly press   Tinel's at wrist: Negative bilaterally Phalen's at wrist: Negative bilaterally Durkan's: Negative bilaterally   Tinel's at elbow: Negative bilaterally   Imaging: XR of the cervical spine from 06/07/2022 was previously independently reviewed and interpreted, showing anterior  cervical fusion from C5-C7.  There is no instrumentation seen.  There appears to be solid fusion across those former disc spaces.  Disc height loss at C3/4 and C4/5.  No fracture or dislocation seen.  No evidence of instability on flexion/extension views.   MRI of the cervical spine from 08/03/2022 was independently reviewed and interpreted, showing bilateral foraminal stenosis at C3/4 and C4/5.  Central stenosis at C3/4 and C4/5.  No other significant stenosis seen.  No T2 cord signal change.  There is a solid fusion mass from C5-C7.     Patient name: [name] Patient MRN: [id number] Date of visit: 08/16/22

## 2022-09-04 ENCOUNTER — Ambulatory Visit: Payer: Medicare Other | Admitting: Physical Medicine and Rehabilitation

## 2022-09-04 ENCOUNTER — Other Ambulatory Visit: Payer: Self-pay

## 2022-09-04 VITALS — BP 159/85 | HR 73

## 2022-09-04 DIAGNOSIS — M5412 Radiculopathy, cervical region: Secondary | ICD-10-CM

## 2022-09-04 MED ORDER — METHYLPREDNISOLONE ACETATE 80 MG/ML IJ SUSP
80.0000 mg | Freq: Once | INTRAMUSCULAR | Status: AC
  Administered 2022-09-04: 80 mg

## 2022-09-04 NOTE — Progress Notes (Signed)
Functional Pain Scale - descriptive words and definitions  Distressing (6)    Pain is present/unable to complete most ADLs limited by pain/sleep is difficult and active distraction is only marginal. Moderate range order  Average Pain 8   +Driver, -BT, -Dye Allergies.  Neck pain that radiates into right arm

## 2022-09-04 NOTE — Procedures (Signed)
Cervical Epidural Steroid Injection - Interlaminar Approach with Fluoroscopic Guidance  Patient: [name]      Date of Birth: 11/04/1955 MRN: [id number] PCP: Richardean Chimera, MD      Visit Date: 09/04/2022   Universal Protocol:    Date/Time: 06/11/241:02 PM  Consent Given By: the patient  Position: PRONE  Additional Comments: Vital signs were monitored before and after the procedure. Patient was prepped and draped in the usual sterile fashion. The correct patient, procedure, and site was verified.   Injection Procedure Details:   Procedure diagnoses: Radiculopathy, cervical region [M54.12]    Meds Administered:  Meds ordered this encounter  Medications   methylPREDNISolone acetate (DEPO-MEDROL) injection 80 mg     Laterality: Right  Location/Site: C7-T1  Needle: 3.5 in., 20 ga. Tuohy  Needle Placement: Paramedian epidural space  Findings:  -Comments: Excellent flow of contrast into the epidural space.  Procedure Details: Using a paramedian approach from the side mentioned above, the region overlying the inferior lamina was localized under fluoroscopic visualization and the soft tissues overlying this structure were infiltrated with 4 ml. of 1% Lidocaine without Epinephrine. A # 20 gauge, Tuohy needle was inserted into the epidural space using a paramedian approach.  The epidural space was localized using loss of resistance along with contralateral oblique bi-planar fluoroscopic views.  After negative aspirate for air, blood, and CSF, a 2 ml. volume of Isovue-250 was injected into the epidural space and the flow of contrast was observed. Radiographs were obtained for documentation purposes.   The injectate was administered into the level noted above.  Additional Comments:  The patient tolerated the procedure well Dressing: 2 x 2 sterile gauze and Band-Aid    Post-procedure details: Patient was observed during the procedure. Post-procedure instructions were  reviewed.  Patient left the clinic in stable condition.

## 2022-09-04 NOTE — Patient Instructions (Signed)

## 2022-09-04 NOTE — Progress Notes (Signed)
[name] - 67 y.o. female MRN [id number]  Date of birth: April 30, 1955  Office Visit Note: Visit Date: 09/04/2022 PCP: Richardean Chimera, MD Referred by: London Sheer, MD  Subjective: Chief Complaint  Patient presents with   Neck - Pain   HPI:  [name] is a 67 y.o. female who comes in today at the request of Dr. Willia Craze for planned Right C7-T1 Cervical Interlaminar epidural steroid injection with fluoroscopic guidance.  The patient has failed conservative care including home exercise, medications, time and activity modification.  This injection will be diagnostic and hopefully therapeutic.  Please see requesting physician notes for further details and justification.   ROS Otherwise per HPI.  Assessment & Plan: Visit Diagnoses:    ICD-10-CM   1. Radiculopathy, cervical region  M54.12 XR C-ARM NO REPORT    Epidural Steroid injection    methylPREDNISolone acetate (DEPO-MEDROL) injection 80 mg      Plan: No additional findings.   Meds & Orders:  Meds ordered this encounter  Medications   methylPREDNISolone acetate (DEPO-MEDROL) injection 80 mg    Orders Placed This Encounter  Procedures   XR C-ARM NO REPORT   Epidural Steroid injection    Follow-up: Return for visit to requesting provider as needed.   Procedures: No procedures performed  Cervical Epidural Steroid Injection - Interlaminar Approach with Fluoroscopic Guidance  Patient: [name]      Date of Birth: 01-25-1956 MRN: [id number] PCP: Richardean Chimera, MD      Visit Date: 09/04/2022   Universal Protocol:    Date/Time: 06/11/241:02 PM  Consent Given By: the patient  Position: PRONE  Additional Comments: Vital signs were monitored before and after the procedure. Patient was prepped and draped in the usual sterile fashion. The correct patient, procedure, and site was verified.   Injection Procedure Details:   Procedure diagnoses: Radiculopathy, cervical region [M54.12]     Meds Administered:  Meds ordered this encounter  Medications   methylPREDNISolone acetate (DEPO-MEDROL) injection 80 mg     Laterality: Right  Location/Site: C7-T1  Needle: 3.5 in., 20 ga. Tuohy  Needle Placement: Paramedian epidural space  Findings:  -Comments: Excellent flow of contrast into the epidural space.  Procedure Details: Using a paramedian approach from the side mentioned above, the region overlying the inferior lamina was localized under fluoroscopic visualization and the soft tissues overlying this structure were infiltrated with 4 ml. of 1% Lidocaine without Epinephrine. A # 20 gauge, Tuohy needle was inserted into the epidural space using a paramedian approach.  The epidural space was localized using loss of resistance along with contralateral oblique bi-planar fluoroscopic views.  After negative aspirate for air, blood, and CSF, a 2 ml. volume of Isovue-250 was injected into the epidural space and the flow of contrast was observed. Radiographs were obtained for documentation purposes.   The injectate was administered into the level noted above.  Additional Comments:  The patient tolerated the procedure well Dressing: 2 x 2 sterile gauze and Band-Aid    Post-procedure details: Patient was observed during the procedure. Post-procedure instructions were reviewed.  Patient left the clinic in stable condition.   Clinical History: No specialty comments available.     Objective:  VS:  HT:    WT:   BMI:     BP:(!) 159/85  HR:73bpm  TEMP: ( )  RESP:  Physical Exam Vitals and nursing note reviewed.  Constitutional:      General: She is not in  acute distress.    Appearance: Normal appearance. She is not ill-appearing.  HENT:     Head: Normocephalic and atraumatic.     Right Ear: External ear normal.     Left Ear: External ear normal.  Eyes:     Extraocular Movements: Extraocular movements intact.  Cardiovascular:     Rate and Rhythm: Normal rate.      Pulses: Normal pulses.  Pulmonary:     Effort: Pulmonary effort is normal. No respiratory distress.  Abdominal:     General: There is no distension.     Palpations: Abdomen is soft.  Musculoskeletal:        General: Tenderness present.     Cervical back: Neck supple.     Right lower leg: No edema.     Left lower leg: No edema.     Comments: Patient has good distal strength with no pain over the greater trochanters.  No clonus or focal weakness.  Skin:    Findings: No erythema, lesion or rash.  Neurological:     General: No focal deficit present.     Mental Status: She is alert and oriented to person, place, and time.     Sensory: No sensory deficit.     Motor: No weakness or abnormal muscle tone.     Coordination: Coordination normal.  Psychiatric:        Mood and Affect: Mood normal.        Behavior: Behavior normal.      Imaging: No results found.

## 2022-09-11 DIAGNOSIS — M545 Low back pain, unspecified: Secondary | ICD-10-CM | POA: Diagnosis not present

## 2022-09-11 DIAGNOSIS — M5412 Radiculopathy, cervical region: Secondary | ICD-10-CM | POA: Diagnosis not present

## 2022-09-13 ENCOUNTER — Ambulatory Visit: Payer: Medicare Other | Admitting: Orthopedic Surgery

## 2022-09-13 DIAGNOSIS — M5412 Radiculopathy, cervical region: Secondary | ICD-10-CM | POA: Diagnosis not present

## 2022-09-13 NOTE — Progress Notes (Signed)
Orthopedic Spine Surgery Office Note   Assessment: Patient is a 67 y.o. female with neck pain that radiates into the right upper extremity to the level of the hand.  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     Plan: -Patient has tried Tylenol, activity modification, medrol dosepak, cervical ESI -She has tried multiple conservative treatment options, so we discussed surgery as a possible treatment option in the form of a ACDF.  However, given her atypical distribution of pain for upper cervical foraminal stenosis, went over the value in getting an EMG/NCS.  She is scheduled to have 11/04/2022.  I went to see her after that to go over the results and discuss her treatment options -Patient should return to office in 6-8 weeks, x-rays at next visit: none     Patient expressed understanding of the plan and all questions were answered to the patient's satisfaction.    __________________________________________________________________________   History: Patient is a 67 y.o. female who has been previously seen in the office for neck pain radiating into the right upper extremity.  Her pain remains the same area in the right upper extremity.  She still feels it going all the way down the arm into her hand.  She feels it in all the fingers.  She has not developed pain into the left upper extremity as well though.  She feels that in the left shoulder and the left lateral arm.  It does not radiate past the elbow on that side.  She has decreased sensation and paresthesias of the hand distal to the wrist on the right side.  Sometimes she drops objects as a result of this decrease in sensation.  She does not have any numbness or paresthesias in the left upper extremity.  She has not noticed any new trouble with fine motor skills in the hand or worsening of her chronic imbalance.   Previous treatments: Tylenol, activity modification, medrol dosepak, cervical ESI   Physical Exam:   General: no acute distress,  appears stated age Neurologic: alert, answering questions appropriately, following commands Respiratory: unlabored breathing on room air, symmetric chest rise Psychiatric: appropriate affect, normal cadence to speech     MSK (spine):   -Strength exam                                                   Left                  Right Grip strength                5/5                   5/5 Interosseus                  5/5                  5/5 Wrist extension            5/5                  5/5 Wrist flexion                 5/5                  5/5 Elbow flexion  5/5                  5/5 Deltoid                          5/5                  5/5   EHL                              5/5                  5/5 TA                                 5/5                  5/5 GSC                             5/5                  5/5 Knee extension            5/5                  5/5 Hip flexion                    5/5                  5/5   -Sensory exam                           Sensation intact to light touch in L3-S1 nerve distributions of bilateral lower extremities             Sensation intact to light touch in C5-T1 nerve distributions of bilateral upper extremities (decreased distal to the wrist on the right side)   -Brachioradialis DTR: 2/4 on the left, 2/4 on the right -Biceps DTR: 2/4 on the left, 2/4 on the right -Achilles DTR: 1/4 on the left, 1/4 on the right -Patellar tendon DTR: 1/4 on the left, 1/4 on the right   -Spurling: Negative bilaterally -Hoffman sign: Negative bilaterally -Clonus: No beats bilaterally -Interosseous wasting: None seen -Grip and release test: Negative -Romberg: Negative -Gait: Slow, short stepped but not wide-based -Imbalance with tandem gait: Yes   Left shoulder exam: No pain to range of motion, negative Jobe, no weakness with external rotation with arm at side, negative belly press Right shoulder exam: No pain to range of motion, negative Jobe,  no weakness with external rotation with arm at side, negative belly press   Tinel's at wrist: Negative bilaterally Phalen's at wrist: Negative bilaterally Durkan's: Negative bilaterally   Tinel's at elbow: Negative bilaterally   Imaging: XR of the cervical spine from 06/07/2022 was previously independently reviewed and interpreted, showing anterior cervical fusion from C5-C7.  There is no instrumentation seen.  There appears to be solid fusion across those former disc spaces.  Disc height loss at C3/4 and C4/5.  No fracture or dislocation seen.  No evidence of instability on flexion/extension views.   MRI of the cervical spine from 08/03/2022 was previously independently reviewed and interpreted, showing bilateral foraminal stenosis at C3/4 and C4/5.  Central stenosis at C3/4 and C4/5.  No other significant stenosis seen.  No T2 cord signal change.  There is a solid fusion mass from C5-C7.     Patient name: [name] Patient MRN: [id number] Date of visit: 09/13/22

## 2022-10-11 DIAGNOSIS — R300 Dysuria: Secondary | ICD-10-CM | POA: Diagnosis not present

## 2022-10-11 DIAGNOSIS — M797 Fibromyalgia: Secondary | ICD-10-CM | POA: Diagnosis not present

## 2022-10-24 DIAGNOSIS — E039 Hypothyroidism, unspecified: Secondary | ICD-10-CM | POA: Diagnosis not present

## 2022-10-24 DIAGNOSIS — K219 Gastro-esophageal reflux disease without esophagitis: Secondary | ICD-10-CM | POA: Diagnosis not present

## 2022-10-24 DIAGNOSIS — R7301 Impaired fasting glucose: Secondary | ICD-10-CM | POA: Diagnosis not present

## 2022-10-24 DIAGNOSIS — E876 Hypokalemia: Secondary | ICD-10-CM | POA: Diagnosis not present

## 2022-10-24 DIAGNOSIS — E559 Vitamin D deficiency, unspecified: Secondary | ICD-10-CM | POA: Diagnosis not present

## 2022-10-24 DIAGNOSIS — E7849 Other hyperlipidemia: Secondary | ICD-10-CM | POA: Diagnosis not present

## 2022-10-24 DIAGNOSIS — N183 Chronic kidney disease, stage 3 unspecified: Secondary | ICD-10-CM | POA: Diagnosis not present

## 2022-10-24 DIAGNOSIS — R5383 Other fatigue: Secondary | ICD-10-CM | POA: Diagnosis not present

## 2022-10-31 DIAGNOSIS — R7301 Impaired fasting glucose: Secondary | ICD-10-CM | POA: Diagnosis not present

## 2022-10-31 DIAGNOSIS — E876 Hypokalemia: Secondary | ICD-10-CM | POA: Diagnosis not present

## 2022-10-31 DIAGNOSIS — I10 Essential (primary) hypertension: Secondary | ICD-10-CM | POA: Diagnosis not present

## 2022-10-31 DIAGNOSIS — J301 Allergic rhinitis due to pollen: Secondary | ICD-10-CM | POA: Diagnosis not present

## 2022-10-31 DIAGNOSIS — M797 Fibromyalgia: Secondary | ICD-10-CM | POA: Diagnosis not present

## 2022-10-31 DIAGNOSIS — D692 Other nonthrombocytopenic purpura: Secondary | ICD-10-CM | POA: Diagnosis not present

## 2022-10-31 DIAGNOSIS — K2100 Gastro-esophageal reflux disease with esophagitis, without bleeding: Secondary | ICD-10-CM | POA: Diagnosis not present

## 2022-10-31 DIAGNOSIS — E7849 Other hyperlipidemia: Secondary | ICD-10-CM | POA: Diagnosis not present

## 2022-10-31 DIAGNOSIS — G43909 Migraine, unspecified, not intractable, without status migrainosus: Secondary | ICD-10-CM | POA: Diagnosis not present

## 2022-11-09 ENCOUNTER — Encounter: Payer: Self-pay | Admitting: Diagnostic Neuroimaging

## 2022-11-09 ENCOUNTER — Ambulatory Visit: Payer: Medicare Other | Admitting: Diagnostic Neuroimaging

## 2022-11-09 VITALS — BP 113/70 | HR 62 | Ht 67.0 in | Wt 159.0 lb

## 2022-11-09 DIAGNOSIS — M5412 Radiculopathy, cervical region: Secondary | ICD-10-CM | POA: Diagnosis not present

## 2022-11-09 NOTE — Progress Notes (Signed)
[facility]  PATIENT: [name] DOB: 13-Sep-1955  REFERRING CLINICIAN: London Sheer, MD HISTORY FROM: PATIENT  REASON FOR VISIT: NEW CONSULT    HISTORICAL  CHIEF COMPLAINT:  Chief Complaint  Patient presents with   New Patient (Initial Visit)    Pt here with husband,  rm 7. She has been having problems right arm and hand. Decreased strength and numbness and tingling in RUE and RLE. 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 They recommended a NCV/EMG test for the RUE. The MRI cervical spine was completed in may.     HISTORY OF PRESENT ILLNESS:   UPDATE (11/09/22, VRP): Here for evaluation of neck pain and R > L upper ext since 2023. History of neck pain and bilateral upper ext pain and numbness ~ 2004. Then had cervical spine surgery in 2004 with metal plate and symptoms improved. Then had repeat surgery ~2006 due to bone spur, and plate was removed. Then in ~2023, return of neck pain to bilateral arms (R > L). Mainly around right shoulder and right elbow. Some migration of numbness in various parts of arms and fingers (changes depending on day and activity). Worse with activity.   PRIOR HPI (11/01/20): 67 year old female here for evaluation of burning feet.  Symptoms started around 2016.  She has had 4 lower back surgeries including most recently in December 2021.  Symptoms have been progressing over the past 6 years but significantly worsened after her last surgery in December 2021.  She has followed up with orthopedic surgery.  Question of tarsal tunnel syndrome was raised.  EMG nerve conduction study has been requested.   REVIEW OF SYSTEMS: Full 14 system review of systems performed and negative with exception of: As per HPI.  ALLERGIES: Allergies  Allergen Reactions   Cephalosporins Nausea And Vomiting   Codeine Hives and Palpitations    Hydrocodone, Ultram   Doxycycline Nausea And Vomiting   Novocain [Procaine] Swelling   Other Nausea And Vomiting     Sensitive to pain medications   Statins Other (See Comments)    myalgia   Sulfa Antibiotics Hives and Nausea And Vomiting   Tricyclic Antidepressants Nausea And Vomiting    Cymbalta and alike meds   Gabapentin Nausea And Vomiting   Ace Inhibitors Cough   Asa [Aspirin] Nausea And Vomiting   Morphine And Codeine     High blood pressure    Ultram [Tramadol] Other (See Comments)    myalgia   Zetia [Ezetimibe] Other (See Comments)    Muscle Aches    HOME MEDICATIONS: Outpatient Medications Prior to Visit  Medication Sig Dispense Refill   acetaminophen (TYLENOL) 650 MG CR tablet Take 1,300 mg by mouth every 8 (eight) hours as needed for pain.     albuterol (VENTOLIN HFA) 108 (90 Base) MCG/ACT inhaler Inhale 1 puff into the lungs every 6 (six) hours as needed for wheezing or shortness of breath.     baclofen (LIORESAL) 10 MG tablet Take 10 mg by mouth 3 (three) times daily as needed for muscle spasms.     cetirizine (ZYRTEC) 10 MG tablet Take 10 mg by mouth daily.     clonazePAM (KLONOPIN) 0.5 MG tablet Take 0.5 mg by mouth at bedtime.     diclofenac Sodium (VOLTAREN) 1 % GEL Apply 1 Application topically 4 (four) times daily as needed (pain).     estradiol (ESTRACE) 1 MG tablet Take 1 mg by mouth daily.     ipratropium (ATROVENT) 0.06 %  nasal spray Place 1 spray into both nostrils 3 (three) times daily as needed for allergies.     Metoprolol-Hydrochlorothiazide 50-12.5 MG TB24 Take 0.5 tablets by mouth daily.      pantoprazole (PROTONIX) 40 MG tablet Take 40 mg by mouth 2 (two) times daily.     potassium chloride SA (KLOR-CON M) 20 MEQ tablet Take 20 mEq by mouth daily.     valACYclovir (VALTREX) 1000 MG tablet Take 500 mg by mouth daily.     methylPREDNISolone (MEDROL DOSEPAK) 4 MG TBPK tablet Take as prescribed on the box 21 tablet 0   No facility-administered medications prior to visit.    PAST MEDICAL HISTORY: Past Medical History:  Diagnosis Date   Arthritis    Asthma     Cancer (HCC)    cervical , skin   Cold    recent  rx   Fibromyalgia    GERD (gastroesophageal reflux disease)    H/O hiatal hernia    Hypertension    PONV (postoperative nausea and vomiting)    Shortness of breath    Sleep apnea    ' mild " does not use cpap   Tarsal tunnel syndrome of both lower extremities     PAST SURGICAL HISTORY: Past Surgical History:  Procedure Laterality Date   ABDOMINAL HYSTERECTOMY  2004   APPENDECTOMY     BACK SURGERY     x3   BACK SURGERY  02/2020   rod placed in back   BIOPSY  10/05/2021   Procedure: BIOPSY;  Surgeon: Lanelle Bal, DO;  Location: [facility];  Service: Endoscopy;;   CERVICAL CONIZATION W/BX     CERVICAL DISC SURGERY  2006   CHOLECYSTECTOMY  2003   COLONOSCOPY WITH PROPOFOL N/A 10/05/2021   Procedure: COLONOSCOPY WITH PROPOFOL;  Surgeon: Lanelle Bal, DO;  Location: [facility];  Service: Endoscopy;  Laterality: N/A;  12:15pm, asa 3   ESOPHAGOGASTRODUODENOSCOPY (EGD) WITH PROPOFOL N/A 10/05/2021   Procedure: ESOPHAGOGASTRODUODENOSCOPY (EGD) WITH PROPOFOL;  Surgeon: Lanelle Bal, DO;  Location: [facility];  Service: Endoscopy;  Laterality: N/A;   LUMBAR FUSION  04/07/2013   L 2 L3 L4    with rod     DR NITKA   POLYPECTOMY  10/05/2021   Procedure: POLYPECTOMY;  Surgeon: Lanelle Bal, DO;  Location: [facility];  Service: Endoscopy;;   TONSILLECTOMY      FAMILY HISTORY: Family History  Problem Relation Age of Onset   Allergic rhinitis Neg Hx    Angioedema Neg Hx    Asthma Neg Hx    Atopy Neg Hx    Eczema Neg Hx    Immunodeficiency Neg Hx    Urticaria Neg Hx     SOCIAL HISTORY: Social History   Socioeconomic History   Marital status: Married    Spouse name: Not on file   Number of children: 1   Years of education: Not on file   Highest education level: 11th grade  Occupational History   Not on file  Tobacco Use   Smoking status: Never   Smokeless tobacco: Never  Vaping Use   Vaping  status: Never Used  Substance and Sexual Activity   Alcohol use: No   Drug use: No   Sexual activity: Yes    Birth control/protection: Surgical  Other Topics Concern   Not on file  Social History Narrative   Not on file   Social Determinants of Health   Financial Resource Strain:  Not on file  Food Insecurity: Not on file  Transportation Needs: Not on file  Physical Activity: Not on file  Stress: Not on file  Social Connections: Not on file  Intimate Partner Violence: Not on file     PHYSICAL EXAM  GENERAL EXAM/CONSTITUTIONAL: Vitals:  Vitals:   11/09/22 0843  BP: 113/70  Pulse: 62  Weight: 159 lb (72.1 kg)  Height: 5\' 7"  (1.702 m)   Body mass index is 24.9 kg/m. Wt Readings from Last 3 Encounters:  11/09/22 159 lb (72.1 kg)  03/16/22 167 lb (75.8 kg)  12/15/21 167 lb (75.8 kg)   Patient is in no distress; well developed, nourished and groomed; neck is supple  CARDIOVASCULAR: Examination of carotid arteries is normal; no carotid bruits Regular rate and rhythm, no murmurs Examination of peripheral vascular system by observation and palpation is normal  EYES: Ophthalmoscopic exam of optic discs and posterior segments is normal; no papilledema or hemorrhages No results found.  MUSCULOSKELETAL: Gait, strength, tone, movements noted in Neurologic exam below  NEUROLOGIC: MENTAL STATUS:      No data to display         awake, alert, oriented to person, place and time recent and remote memory intact normal attention and concentration language fluent, comprehension intact, naming intact fund of knowledge appropriate  CRANIAL NERVE:  2nd - no papilledema on fundoscopic exam 2nd, 3rd, 4th, 6th - pupils equal and reactive to light, visual fields full to confrontation, extraocular muscles intact, no nystagmus 5th - facial sensation symmetric 7th - facial strength symmetric 8th - hearing intact 9th - palate elevates symmetrically, uvula midline 11th -  shoulder shrug symmetric 12th - tongue protrusion midline  MOTOR:  normal bulk and tone, full strength in the BUE, BLE; EXCEPT BILATERAL THUMB ABDUCTION 4/5; MILD ATROPHY OF BILATERAL APB  SENSORY:  normal and symmetric to light touch, pinprick, temperature, vibration; EXCEPT DECR IN DIGIT 2 IN RIGHT HAND TO PP AND DECR IN FEET TO PP, TEMP, VIB BORDERLINE PHALEN'S ON RIGHT  COORDINATION:  finger-nose-finger, fine finger movements normal  REFLEXES:  deep tendon reflexes TRACE and symmetric  GAIT/STATION:  narrow based gait; antalgic, unsteady gait     DIAGNOSTIC DATA (LABS, IMAGING, TESTING) - I reviewed patient records, labs, notes, testing and imaging myself where available.  Lab Results  Component Value Date   WBC 15.8 (H) 03/10/2020   HGB 10.2 (L) 03/10/2020   HCT 29.5 (L) 03/10/2020   MCV 89.1 03/10/2020   PLT 257 03/10/2020      Component Value Date/Time   NA 133 (L) 03/11/2020 0107   K 3.6 03/11/2020 0107   CL 98 03/11/2020 0107   CO2 27 03/11/2020 0107   GLUCOSE 152 (H) 03/11/2020 0107   BUN 5 (L) 03/11/2020 0107   CREATININE 0.67 03/11/2020 0107   CALCIUM 8.2 (L) 03/11/2020 0107   PROT 6.1 (L) 08/04/2014 1120   ALBUMIN 3.8 08/04/2014 1120   AST 14 (L) 08/04/2014 1120   ALT 11 (L) 08/04/2014 1120   ALKPHOS 64 08/04/2014 1120   BILITOT 0.4 08/04/2014 1120   GFRNONAA >60 03/11/2020 0107   GFRAA >60 08/11/2014 0845   No results found for: "CHOL", "HDL", "LDLCALC", "LDLDIRECT", "TRIG", "CHOLHDL" Lab Results  Component Value Date   HGBA1C 5.9 (H) 08/12/2014   No results found for: "VITAMINB12" No results found for: "TSH"   11/03/2019 CT myelogram thoracic and lumbar spine No significant thoracic region pathology. Dorsal neuro stimulators in place.   Solid union  from L2 to the sacrum. Previous discectomy and posterior decompression throughout that region. Sufficient patency of the canal and foramina.   L1-2 shows profound degenerative spondylosis  with loss of disc height, endplate cystic change, endplate insufficiency fractures and bone sclerosis. Bulging of the disc. Abnormal motion at this level with flexion extension. Laminar hooks at L1 and L2, obviously without solid union.  04/21/2020 x-ray lumbar spine AP and lateral lumbar spine shows pedicle screws and rods from L5 to T11,  the T10 level is not seen well. Anterior interbody fusions at L1-2, and  L2-3 with restoration of the normal sagital alignment..There is no  loosening of the hardware seen recommend that she have repeat xrays at  next visit to include the T10 level.   11/17/20 EMG/NCS This study demonstrates: -Decreased amplitude of left tibial motor response, but otherwise unremarkable nerve conduction and needle EMG. This could be related to left S1 radiculopathy (in the setting of normal sural sensory response and history of lumbar spine surgeries).  -Absent medial and lateral plantar responses bilaterally could be related to distal polyneuropathy versus limitation due to technical factors.  08/03/22 MRI cervical spine [I reviewed images myself. There is severe left foraminal stenosis at C3-4 and severe right foraminal stenosis at C4-5. -VRP]  1. Advanced degenerative disc disease at C3-4 with moderate flattening of the ventral thecal sac and narrowing of the ventral CSF space. Left-sided disc osteophyte complex contributing to significant medial foraminal stenosis likely affecting the left C4 nerve root. There is also mild right foraminal stenosis. 2. Severe degenerative disc disease at C4-5 with moderate mass effect on the ventral thecal sac and narrowing of the ventral CSF space. Asymmetric right-sided facet disease contributing to mild right foraminal stenosis. 3. Interbody fusion changes at C5-6 and C6-7. No significant spinal or foraminal stenosis at these levels. 4. Bulging and slightly uncovered disc at C7-T1 but no significant spinal or foraminal  stenosis.   ASSESSMENT AND PLAN  67 y.o. year old female here with numbness and burning in feet since 2016, worsened after surgery in December 2021.  May represent lumbar radiculopathies versus peripheral neuropathies.    Now with worsening neck pain, bilateral arm pain and numbness since 2023. History of cervical spine surgeries x 2.   Dx:  1. Cervical radiculopathy       PLAN:  INCREASING NECK PAIN + BILATERAL ARM PAIN (RIGHT > LEFT) since ~ 2023; history of cervical spine surgery x 2 --> clinically consistent with right > left cervical radiculopathies (on MRI cervical spine --> severe left foraminal stenosis at C3-4 and severe right foraminal stenosis at C4-5) - check EMG/NCS (rule out peripheral neuropathies)  Orders Placed This Encounter  Procedures   NCV with EMG(electromyography)   Return for for NCV/EMG.    Suanne Marker, MD 11/09/2022, 9:11 AM Certified in Neurology, Neurophysiology and Neuroimaging  [facility] [address] [place], [place] [address] [phone]

## 2022-11-09 NOTE — Patient Instructions (Signed)
  INCREASING NECK PAIN + BILATERAL ARM PAIN (RIGHT > LEFT) since ~ 2023; history of cervical spine surgery x 2 --> clinically consistent with right > left cervical radiculopathies (on MRI cervical spine --> severe left foraminal stenosis at C3-4 and severe right foraminal stenosis at C4-5) - check EMG/NCS (rule out peripheral neuropathies)

## 2022-11-14 ENCOUNTER — Ambulatory Visit: Payer: Medicare Other | Admitting: Orthopedic Surgery

## 2022-12-11 DIAGNOSIS — J301 Allergic rhinitis due to pollen: Secondary | ICD-10-CM | POA: Diagnosis not present

## 2022-12-11 DIAGNOSIS — K2100 Gastro-esophageal reflux disease with esophagitis, without bleeding: Secondary | ICD-10-CM | POA: Diagnosis not present

## 2022-12-11 DIAGNOSIS — Z6824 Body mass index (BMI) 24.0-24.9, adult: Secondary | ICD-10-CM | POA: Diagnosis not present

## 2022-12-13 ENCOUNTER — Ambulatory Visit (INDEPENDENT_AMBULATORY_CARE_PROVIDER_SITE_OTHER): Payer: Medicare Other | Admitting: Diagnostic Neuroimaging

## 2022-12-13 ENCOUNTER — Ambulatory Visit: Payer: Self-pay | Admitting: Diagnostic Neuroimaging

## 2022-12-13 DIAGNOSIS — M5412 Radiculopathy, cervical region: Secondary | ICD-10-CM

## 2022-12-13 DIAGNOSIS — Z0289 Encounter for other administrative examinations: Secondary | ICD-10-CM

## 2022-12-17 NOTE — Procedures (Signed)
[facility]  NCS (NERVE CONDUCTION STUDY) WITH EMG (ELECTROMYOGRAPHY) REPORT   STUDY DATE: 12/13/22 PATIENT NAME: [name] DOB: [birth year] MRN: [id number]  ORDERING CLINICIAN: Joycelyn Schmid, MD   TECHNOLOGIST: Jenelle Mages ELECTROMYOGRAPHER: Glenford Bayley. Raphaella Larkin, MD  CLINICAL INFORMATION: 67 year old female with numbness in hands.  FINDINGS: NERVE CONDUCTION STUDY:  Bilateral median and ulnar motor responses are normal.  Bilateral ulnar F-wave latencies are normal.  Bilateral median to ulnar transcarpal comparison studies are normal.  Right median sensory response has slightly prolonged peak latency and normal amplitude.  Left ulnar sensory response has slightly prolonged peak latency and normal amplitude.  Left median and right ulnar sensory responses are normal.   NEEDLE ELECTROMYOGRAPHY:  Needle examination of right upper extremity is normal.   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   - No electrodiagnostic evidence of widespread underlying large fiber polyneuropathy.    INTERPRETING PHYSICIAN:  Suanne Marker, MD Certified in Neurology, Neurophysiology and Neuroimaging  [facility] [address] [place], [place] [address] [phone]   [facility]    Nerve / Sites Muscle Latency Ref. Amplitude Ref. Rel Amp Segments Distance Velocity Ref. Area    ms ms mV mV %  cm m/s m/s mVms  R Median - APB     Wrist APB 4.4 <=4.4 8.5 >=4.0 100 Wrist - APB 7   36.2     Upper arm APB 9.8  8.2  96.4 Upper arm - Wrist 26 48 >=49 33.7  L Median - APB     Wrist APB 4.3 <=4.4 11.9 >=4.0 100 Wrist - APB 7   56.1     Upper arm APB 9.4  6.8  57.1 Upper arm - Wrist 25 50 >=49 34.8  R Ulnar - ADM     Wrist ADM 3.1 <=3.3 10.0 >=6.0 100 Wrist - ADM 7   29.8     B.Elbow ADM 5.9  9.6  96.1 B.Elbow - Wrist 14  51 >=49 30.7     A.Elbow ADM 9.1  7.5  78.3 A.Elbow - B.Elbow 16.6 52 >=49 28.5  L Ulnar - ADM     Wrist ADM 3.1 <=3.3 6.7 >=6.0 100 Wrist - ADM 7   30.9     B.Elbow ADM 4.9  5.8  86.3 B.Elbow - Wrist 10 54 >=49 25.6     A.Elbow ADM 8.7  6.8  116 A.Elbow - B.Elbow 19 50 >=49 28.3             SNC    Nerve / Sites Rec. Site Peak Lat Ref.  Amp Ref. Segments Distance Peak Diff Ref.    ms ms V V  cm ms ms  R Median, Ulnar - Transcarpal comparison     Median Palm Wrist 2.4 <=2.2 93 >=35 Median Palm - Wrist 8       Ulnar Palm Wrist 2.1 <=2.2 50 >=12 Ulnar Palm - Wrist 8          Median Palm - Ulnar Palm  0.3 <=0.4  L Median, Ulnar - Transcarpal comparison     Median Palm Wrist 2.2 <=2.2 39 >=35 Median Palm - Wrist 8       Ulnar Palm Wrist 2.2 <=2.2 20 >=12 Ulnar Palm - Wrist 8          Median Palm - Ulnar Palm  0.0 <=  0.4  R Median - Orthodromic (Dig II, Mid palm)     Dig II Wrist 3.6 <=3.4 28 >=10 Dig II - Wrist 13    L Median - Orthodromic (Dig II, Mid palm)     Dig II Wrist 3.4 <=3.4 18 >=10 Dig II - Wrist 13    R Ulnar - Orthodromic, (Dig V, Mid palm)     Dig V Wrist 3.1 <=3.1 14 >=5 Dig V - Wrist 11    L Ulnar - Orthodromic, (Dig V, Mid palm)     Dig V Wrist 3.3 <=3.1 10 >=5 Dig V - Wrist 49                   F  Wave    Nerve F Lat Ref.   ms ms  R Ulnar - ADM 29.1 <=32.0  L Ulnar - ADM 29.0 <=32.0         EMG Summary Table    Spontaneous MUAP Recruitment  Muscle IA Fib PSW Fasc Other Amp Dur. Poly Pattern  R. Deltoid Normal None None None _______ Normal Normal Normal Normal  R. Biceps brachii Normal None None None _______ Normal Normal Normal Normal  R. Triceps brachii Normal None None None _______ Normal Normal Normal Normal  R. Flexor carpi radialis Normal None None None _______ Normal Normal Normal Normal  R. First dorsal interosseous Normal None None None _______ Normal Normal Normal Normal

## 2022-12-19 ENCOUNTER — Telehealth: Payer: Self-pay | Admitting: Orthopedic Surgery

## 2022-12-19 NOTE — Telephone Encounter (Signed)
Please email 12/20/22 to patient [name] when available. Patient needs copy of all her medical records and would like to pick up tomorrow at her appointment. She will sign authorization when she comes in.

## 2022-12-20 ENCOUNTER — Ambulatory Visit: Payer: Medicare Other | Admitting: Orthopedic Surgery

## 2022-12-20 DIAGNOSIS — J329 Chronic sinusitis, unspecified: Secondary | ICD-10-CM | POA: Diagnosis not present

## 2022-12-20 DIAGNOSIS — Z6824 Body mass index (BMI) 24.0-24.9, adult: Secondary | ICD-10-CM | POA: Diagnosis not present

## 2022-12-20 DIAGNOSIS — M5412 Radiculopathy, cervical region: Secondary | ICD-10-CM | POA: Diagnosis not present

## 2022-12-20 NOTE — Progress Notes (Signed)
Orthopedic Spine Surgery Office Note   Assessment: Patient is a 67 y.o. female with neck pain that radiates into the right upper extremity to the level of the hand.  Has foraminal and central stenosis at C3/4 and C4/5.     Plan: -Patient has tried Tylenol, activity modification, medrol dosepak, cervical ESI -Patient has tried all conservative treatments but has not gotten any relief. She feels her symptoms continue to progress. At our last visit, I briefly discussed ACDF and some of the risks associated with that surgery. I was beginning to talk about surgery as a treatment option for her but she stopped me to tell me that she does not trust me to do her surgery. She said she was afraid of the complications that I had mentioned. I explained that it is my job to cover some of the risks of surgery and that every surgery has risks associated with it. She has sought out another opinion. She has found a neurosurgeon in . I encouraged her to follow through with that and told her she should not do surgery with me if she does not trust me or have confidence in my ability to take care of her. I told her I would be available and she can return at any point in the future if she would like -Patient should return to office on an as needed basis     Patient expressed understanding of the plan and all questions were answered to the patient's satisfaction.    __________________________________________________________________________   History: Patient is a 67 y.o. female who has been previously seen in the office for neck pain radiating into the right upper extremity initially. Her pain has progressed over time and she now has pain starting in her neck and radiating into bilateral upper extremities. She feels it radiates throughout the arm in multiple distributions on both sides. She said she cannot dance because of the pain. She feels the pain on a daily basis. She feels she has difficulty with using her  right hand because of the pain.    Previous treatments: Tylenol, activity modification, medrol dosepak, cervical ESI    Physical Exam:   General: no acute distress, appears stated age Neurologic: alert, answering questions appropriately, following commands Respiratory: unlabored breathing on room air, symmetric chest rise Psychiatric: appropriate affect, normal cadence to speech     MSK (spine):   -Strength exam                                                   Left                  Right Grip strength                5/5                   5/5 Interosseus                  5/5                  5/5 Wrist extension            5/5                  5/5 Wrist flexion  5/5                  5/5 Elbow flexion                5/5                  5/5 Deltoid                          5/5                  5/5   -Sensory exam                           Sensation intact to light touch in C5-T1 nerve distributions of bilateral upper extremities (decreased distal to the wrist on the right side)  Imaging: XR of the cervical spine from 06/07/2022 was previously independently reviewed and interpreted, showing anterior cervical fusion from C5-C7.  There is no instrumentation seen.  There appears to be solid fusion across those former disc spaces.  Disc height loss at C3/4 and C4/5.  No fracture or dislocation seen.  No evidence of instability on flexion/extension views.   MRI of the cervical spine from 08/03/2022 was previously independently reviewed and interpreted, showing bilateral foraminal stenosis at C3/4 and C4/5.  Central stenosis at C3/4 and C4/5.  No other significant stenosis seen.  No T2 cord signal change.  There is a solid fusion mass from C5-C7.   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 No evidence of radiculopathy or large sensory polyneuropathy    Patient name: [name] Patient MRN: [id number] Date of visit: 12/20/22

## 2022-12-21 NOTE — Telephone Encounter (Signed)
Emailed to patient.

## 2022-12-25 DIAGNOSIS — M5001 Cervical disc disorder with myelopathy,  high cervical region: Secondary | ICD-10-CM | POA: Diagnosis not present

## 2022-12-27 DIAGNOSIS — Z23 Encounter for immunization: Secondary | ICD-10-CM | POA: Diagnosis not present

## 2022-12-31 DIAGNOSIS — N183 Chronic kidney disease, stage 3 unspecified: Secondary | ICD-10-CM | POA: Diagnosis not present

## 2022-12-31 DIAGNOSIS — I129 Hypertensive chronic kidney disease with stage 1 through stage 4 chronic kidney disease, or unspecified chronic kidney disease: Secondary | ICD-10-CM | POA: Diagnosis not present

## 2022-12-31 DIAGNOSIS — Z882 Allergy status to sulfonamides status: Secondary | ICD-10-CM | POA: Diagnosis not present

## 2022-12-31 DIAGNOSIS — Z886 Allergy status to analgesic agent status: Secondary | ICD-10-CM | POA: Diagnosis not present

## 2022-12-31 DIAGNOSIS — M1990 Unspecified osteoarthritis, unspecified site: Secondary | ICD-10-CM | POA: Diagnosis not present

## 2022-12-31 DIAGNOSIS — K219 Gastro-esophageal reflux disease without esophagitis: Secondary | ICD-10-CM | POA: Diagnosis not present

## 2022-12-31 DIAGNOSIS — M5001 Cervical disc disorder with myelopathy,  high cervical region: Secondary | ICD-10-CM | POA: Diagnosis not present

## 2022-12-31 DIAGNOSIS — Z981 Arthrodesis status: Secondary | ICD-10-CM | POA: Diagnosis not present

## 2022-12-31 DIAGNOSIS — M797 Fibromyalgia: Secondary | ICD-10-CM | POA: Diagnosis not present

## 2022-12-31 DIAGNOSIS — Z885 Allergy status to narcotic agent status: Secondary | ICD-10-CM | POA: Diagnosis not present

## 2022-12-31 DIAGNOSIS — M50021 Cervical disc disorder at C4-C5 level with myelopathy: Secondary | ICD-10-CM | POA: Diagnosis not present

## 2022-12-31 DIAGNOSIS — Z79899 Other long term (current) drug therapy: Secondary | ICD-10-CM | POA: Diagnosis not present

## 2022-12-31 DIAGNOSIS — J4520 Mild intermittent asthma, uncomplicated: Secondary | ICD-10-CM | POA: Diagnosis not present

## 2022-12-31 DIAGNOSIS — Z881 Allergy status to other antibiotic agents status: Secondary | ICD-10-CM | POA: Diagnosis not present

## 2023-01-21 DIAGNOSIS — Z981 Arthrodesis status: Secondary | ICD-10-CM | POA: Diagnosis not present

## 2023-02-05 DIAGNOSIS — J329 Chronic sinusitis, unspecified: Secondary | ICD-10-CM | POA: Diagnosis not present

## 2023-02-05 DIAGNOSIS — M25511 Pain in right shoulder: Secondary | ICD-10-CM | POA: Diagnosis not present

## 2023-02-05 DIAGNOSIS — Z6823 Body mass index (BMI) 23.0-23.9, adult: Secondary | ICD-10-CM | POA: Diagnosis not present

## 2023-03-06 DIAGNOSIS — E559 Vitamin D deficiency, unspecified: Secondary | ICD-10-CM | POA: Diagnosis not present

## 2023-03-06 DIAGNOSIS — E039 Hypothyroidism, unspecified: Secondary | ICD-10-CM | POA: Diagnosis not present

## 2023-03-06 DIAGNOSIS — N183 Chronic kidney disease, stage 3 unspecified: Secondary | ICD-10-CM | POA: Diagnosis not present

## 2023-03-06 DIAGNOSIS — E7849 Other hyperlipidemia: Secondary | ICD-10-CM | POA: Diagnosis not present

## 2023-03-06 DIAGNOSIS — R7301 Impaired fasting glucose: Secondary | ICD-10-CM | POA: Diagnosis not present

## 2023-03-06 DIAGNOSIS — I10 Essential (primary) hypertension: Secondary | ICD-10-CM | POA: Diagnosis not present

## 2023-03-13 DIAGNOSIS — D692 Other nonthrombocytopenic purpura: Secondary | ICD-10-CM | POA: Diagnosis not present

## 2023-03-13 DIAGNOSIS — E876 Hypokalemia: Secondary | ICD-10-CM | POA: Diagnosis not present

## 2023-03-13 DIAGNOSIS — R4582 Worries: Secondary | ICD-10-CM | POA: Diagnosis not present

## 2023-03-13 DIAGNOSIS — M797 Fibromyalgia: Secondary | ICD-10-CM | POA: Diagnosis not present

## 2023-03-13 DIAGNOSIS — E7849 Other hyperlipidemia: Secondary | ICD-10-CM | POA: Diagnosis not present

## 2023-03-13 DIAGNOSIS — R7301 Impaired fasting glucose: Secondary | ICD-10-CM | POA: Diagnosis not present

## 2023-03-13 DIAGNOSIS — I10 Essential (primary) hypertension: Secondary | ICD-10-CM | POA: Diagnosis not present

## 2023-03-13 DIAGNOSIS — K2100 Gastro-esophageal reflux disease with esophagitis, without bleeding: Secondary | ICD-10-CM | POA: Diagnosis not present

## 2023-03-13 DIAGNOSIS — G43909 Migraine, unspecified, not intractable, without status migrainosus: Secondary | ICD-10-CM | POA: Diagnosis not present

## 2023-03-13 DIAGNOSIS — Z0001 Encounter for general adult medical examination with abnormal findings: Secondary | ICD-10-CM | POA: Diagnosis not present

## 2023-03-30 DIAGNOSIS — Z6824 Body mass index (BMI) 24.0-24.9, adult: Secondary | ICD-10-CM | POA: Diagnosis not present

## 2023-03-30 DIAGNOSIS — R030 Elevated blood-pressure reading, without diagnosis of hypertension: Secondary | ICD-10-CM | POA: Diagnosis not present

## 2023-03-30 DIAGNOSIS — R059 Cough, unspecified: Secondary | ICD-10-CM | POA: Diagnosis not present

## 2023-04-04 DIAGNOSIS — Z6824 Body mass index (BMI) 24.0-24.9, adult: Secondary | ICD-10-CM | POA: Diagnosis not present

## 2023-04-04 DIAGNOSIS — J329 Chronic sinusitis, unspecified: Secondary | ICD-10-CM | POA: Diagnosis not present

## 2023-04-04 DIAGNOSIS — R030 Elevated blood-pressure reading, without diagnosis of hypertension: Secondary | ICD-10-CM | POA: Diagnosis not present

## 2023-04-04 DIAGNOSIS — R059 Cough, unspecified: Secondary | ICD-10-CM | POA: Diagnosis not present

## 2023-04-23 DIAGNOSIS — Z0001 Encounter for general adult medical examination with abnormal findings: Secondary | ICD-10-CM | POA: Diagnosis not present

## 2023-04-23 DIAGNOSIS — Z6824 Body mass index (BMI) 24.0-24.9, adult: Secondary | ICD-10-CM | POA: Diagnosis not present

## 2023-05-02 DIAGNOSIS — M5184 Other intervertebral disc disorders, thoracic region: Secondary | ICD-10-CM | POA: Diagnosis not present

## 2023-05-02 DIAGNOSIS — M5134 Other intervertebral disc degeneration, thoracic region: Secondary | ICD-10-CM | POA: Diagnosis not present

## 2023-05-02 DIAGNOSIS — M546 Pain in thoracic spine: Secondary | ICD-10-CM | POA: Diagnosis not present

## 2023-05-02 DIAGNOSIS — M47814 Spondylosis without myelopathy or radiculopathy, thoracic region: Secondary | ICD-10-CM | POA: Diagnosis not present

## 2023-05-02 DIAGNOSIS — D1809 Hemangioma of other sites: Secondary | ICD-10-CM | POA: Diagnosis not present

## 2023-05-03 DIAGNOSIS — Z6824 Body mass index (BMI) 24.0-24.9, adult: Secondary | ICD-10-CM | POA: Diagnosis not present

## 2023-05-03 DIAGNOSIS — U071 COVID-19: Secondary | ICD-10-CM | POA: Diagnosis not present

## 2023-05-03 DIAGNOSIS — R051 Acute cough: Secondary | ICD-10-CM | POA: Diagnosis not present

## 2023-06-11 DIAGNOSIS — M546 Pain in thoracic spine: Secondary | ICD-10-CM | POA: Diagnosis not present

## 2023-06-18 DIAGNOSIS — M48061 Spinal stenosis, lumbar region without neurogenic claudication: Secondary | ICD-10-CM | POA: Diagnosis not present

## 2023-06-18 DIAGNOSIS — M545 Low back pain, unspecified: Secondary | ICD-10-CM | POA: Diagnosis not present

## 2023-06-18 DIAGNOSIS — M5003 Cervical disc disorder with myelopathy, cervicothoracic region: Secondary | ICD-10-CM | POA: Diagnosis not present

## 2023-06-18 DIAGNOSIS — M5001 Cervical disc disorder with myelopathy,  high cervical region: Secondary | ICD-10-CM | POA: Diagnosis not present

## 2023-06-18 DIAGNOSIS — M960 Pseudarthrosis after fusion or arthrodesis: Secondary | ICD-10-CM | POA: Diagnosis not present

## 2023-06-18 DIAGNOSIS — M4807 Spinal stenosis, lumbosacral region: Secondary | ICD-10-CM | POA: Diagnosis not present

## 2023-06-18 DIAGNOSIS — M50021 Cervical disc disorder at C4-C5 level with myelopathy: Secondary | ICD-10-CM | POA: Diagnosis not present

## 2023-06-18 DIAGNOSIS — M4802 Spinal stenosis, cervical region: Secondary | ICD-10-CM | POA: Diagnosis not present

## 2023-06-18 DIAGNOSIS — Z981 Arthrodesis status: Secondary | ICD-10-CM | POA: Diagnosis not present

## 2023-06-26 DIAGNOSIS — J302 Other seasonal allergic rhinitis: Secondary | ICD-10-CM | POA: Diagnosis not present

## 2023-06-26 DIAGNOSIS — K219 Gastro-esophageal reflux disease without esophagitis: Secondary | ICD-10-CM | POA: Diagnosis not present

## 2023-06-26 DIAGNOSIS — G8929 Other chronic pain: Secondary | ICD-10-CM | POA: Diagnosis not present

## 2023-06-26 DIAGNOSIS — Z01818 Encounter for other preprocedural examination: Secondary | ICD-10-CM | POA: Diagnosis not present

## 2023-06-26 DIAGNOSIS — R54 Age-related physical debility: Secondary | ICD-10-CM | POA: Diagnosis not present

## 2023-06-26 DIAGNOSIS — J45909 Unspecified asthma, uncomplicated: Secondary | ICD-10-CM | POA: Diagnosis not present

## 2023-06-26 DIAGNOSIS — N1831 Chronic kidney disease, stage 3a: Secondary | ICD-10-CM | POA: Diagnosis not present

## 2023-07-01 DIAGNOSIS — M797 Fibromyalgia: Secondary | ICD-10-CM | POA: Diagnosis not present

## 2023-07-01 DIAGNOSIS — M4154 Other secondary scoliosis, thoracic region: Secondary | ICD-10-CM | POA: Diagnosis not present

## 2023-07-01 DIAGNOSIS — G629 Polyneuropathy, unspecified: Secondary | ICD-10-CM | POA: Diagnosis not present

## 2023-07-01 DIAGNOSIS — K219 Gastro-esophageal reflux disease without esophagitis: Secondary | ICD-10-CM | POA: Diagnosis not present

## 2023-07-01 DIAGNOSIS — Z9049 Acquired absence of other specified parts of digestive tract: Secondary | ICD-10-CM | POA: Diagnosis not present

## 2023-07-01 DIAGNOSIS — M960 Pseudarthrosis after fusion or arthrodesis: Secondary | ICD-10-CM | POA: Diagnosis not present

## 2023-07-01 DIAGNOSIS — J45909 Unspecified asthma, uncomplicated: Secondary | ICD-10-CM | POA: Diagnosis not present

## 2023-07-01 DIAGNOSIS — I129 Hypertensive chronic kidney disease with stage 1 through stage 4 chronic kidney disease, or unspecified chronic kidney disease: Secondary | ICD-10-CM | POA: Diagnosis not present

## 2023-07-01 DIAGNOSIS — M532X4 Spinal instabilities, thoracic region: Secondary | ICD-10-CM | POA: Diagnosis not present

## 2023-07-01 DIAGNOSIS — N189 Chronic kidney disease, unspecified: Secondary | ICD-10-CM | POA: Diagnosis not present

## 2023-07-01 DIAGNOSIS — Z79899 Other long term (current) drug therapy: Secondary | ICD-10-CM | POA: Diagnosis not present

## 2023-07-04 DIAGNOSIS — R112 Nausea with vomiting, unspecified: Secondary | ICD-10-CM | POA: Diagnosis not present

## 2023-07-04 DIAGNOSIS — Z6824 Body mass index (BMI) 24.0-24.9, adult: Secondary | ICD-10-CM | POA: Diagnosis not present

## 2023-07-04 DIAGNOSIS — R109 Unspecified abdominal pain: Secondary | ICD-10-CM | POA: Diagnosis not present

## 2023-07-04 DIAGNOSIS — Z9889 Other specified postprocedural states: Secondary | ICD-10-CM | POA: Diagnosis not present

## 2023-07-18 DIAGNOSIS — E7849 Other hyperlipidemia: Secondary | ICD-10-CM | POA: Diagnosis not present

## 2023-07-18 DIAGNOSIS — R7301 Impaired fasting glucose: Secondary | ICD-10-CM | POA: Diagnosis not present

## 2023-07-18 DIAGNOSIS — Z0000 Encounter for general adult medical examination without abnormal findings: Secondary | ICD-10-CM | POA: Diagnosis not present

## 2023-07-25 DIAGNOSIS — G43909 Migraine, unspecified, not intractable, without status migrainosus: Secondary | ICD-10-CM | POA: Diagnosis not present

## 2023-07-25 DIAGNOSIS — Z6824 Body mass index (BMI) 24.0-24.9, adult: Secondary | ICD-10-CM | POA: Diagnosis not present

## 2023-07-25 DIAGNOSIS — E7849 Other hyperlipidemia: Secondary | ICD-10-CM | POA: Diagnosis not present

## 2023-07-25 DIAGNOSIS — E782 Mixed hyperlipidemia: Secondary | ICD-10-CM | POA: Diagnosis not present

## 2023-07-25 DIAGNOSIS — M797 Fibromyalgia: Secondary | ICD-10-CM | POA: Diagnosis not present

## 2023-08-20 DIAGNOSIS — Z1231 Encounter for screening mammogram for malignant neoplasm of breast: Secondary | ICD-10-CM | POA: Diagnosis not present

## 2023-08-21 DIAGNOSIS — M797 Fibromyalgia: Secondary | ICD-10-CM | POA: Diagnosis not present

## 2023-08-21 DIAGNOSIS — Z6824 Body mass index (BMI) 24.0-24.9, adult: Secondary | ICD-10-CM | POA: Diagnosis not present

## 2023-08-21 DIAGNOSIS — K638219 Small intestinal bacterial overgrowth, unspecified: Secondary | ICD-10-CM | POA: Diagnosis not present

## 2023-09-17 DIAGNOSIS — Z6824 Body mass index (BMI) 24.0-24.9, adult: Secondary | ICD-10-CM | POA: Diagnosis not present

## 2023-09-17 DIAGNOSIS — J0101 Acute recurrent maxillary sinusitis: Secondary | ICD-10-CM | POA: Diagnosis not present

## 2023-09-17 DIAGNOSIS — R051 Acute cough: Secondary | ICD-10-CM | POA: Diagnosis not present

## 2023-09-26 DIAGNOSIS — H8112 Benign paroxysmal vertigo, left ear: Secondary | ICD-10-CM | POA: Diagnosis not present

## 2023-09-26 DIAGNOSIS — Z6822 Body mass index (BMI) 22.0-22.9, adult: Secondary | ICD-10-CM | POA: Diagnosis not present

## 2023-10-22 DIAGNOSIS — R7301 Impaired fasting glucose: Secondary | ICD-10-CM | POA: Diagnosis not present

## 2023-10-22 DIAGNOSIS — E7849 Other hyperlipidemia: Secondary | ICD-10-CM | POA: Diagnosis not present

## 2023-10-22 DIAGNOSIS — Z0001 Encounter for general adult medical examination with abnormal findings: Secondary | ICD-10-CM | POA: Diagnosis not present

## 2023-10-22 DIAGNOSIS — E039 Hypothyroidism, unspecified: Secondary | ICD-10-CM | POA: Diagnosis not present

## 2023-10-22 DIAGNOSIS — E559 Vitamin D deficiency, unspecified: Secondary | ICD-10-CM | POA: Diagnosis not present

## 2023-10-29 DIAGNOSIS — E782 Mixed hyperlipidemia: Secondary | ICD-10-CM | POA: Diagnosis not present

## 2023-10-29 DIAGNOSIS — M797 Fibromyalgia: Secondary | ICD-10-CM | POA: Diagnosis not present

## 2023-10-29 DIAGNOSIS — G43909 Migraine, unspecified, not intractable, without status migrainosus: Secondary | ICD-10-CM | POA: Diagnosis not present

## 2023-12-12 DIAGNOSIS — J0100 Acute maxillary sinusitis, unspecified: Secondary | ICD-10-CM | POA: Diagnosis not present

## 2023-12-18 DIAGNOSIS — Z23 Encounter for immunization: Secondary | ICD-10-CM | POA: Diagnosis not present
